# Patient Record
Sex: Female | Born: 1955 | Race: White | Hispanic: No | Smoking: Current every day smoker
Health system: Southern US, Community
[De-identification: ages and names within clinical notes are randomized; demographics above are authoritative.]

## PROBLEM LIST (undated history)

## (undated) DIAGNOSIS — F419 Anxiety disorder, unspecified: Secondary | ICD-10-CM

## (undated) DIAGNOSIS — R011 Cardiac murmur, unspecified: Secondary | ICD-10-CM

## (undated) DIAGNOSIS — I10 Essential (primary) hypertension: Secondary | ICD-10-CM

## (undated) DIAGNOSIS — J449 Chronic obstructive pulmonary disease, unspecified: Secondary | ICD-10-CM

## (undated) DIAGNOSIS — E119 Type 2 diabetes mellitus without complications: Secondary | ICD-10-CM

## (undated) DIAGNOSIS — I2510 Atherosclerotic heart disease of native coronary artery without angina pectoris: Secondary | ICD-10-CM

## (undated) HISTORY — DX: Type 2 diabetes mellitus without complications: E11.9

## (undated) HISTORY — PX: NECK SURGERY: SHX720

## (undated) HISTORY — DX: Atherosclerotic heart disease of native coronary artery without angina pectoris: I25.10

---

## 1999-12-29 ENCOUNTER — Emergency Department (HOSPITAL_COMMUNITY): Admission: EM | Admit: 1999-12-29 | Discharge: 1999-12-29 | Payer: Self-pay | Admitting: Emergency Medicine

## 2002-12-27 ENCOUNTER — Ambulatory Visit (HOSPITAL_BASED_OUTPATIENT_CLINIC_OR_DEPARTMENT_OTHER): Admission: RE | Admit: 2002-12-27 | Discharge: 2002-12-27 | Payer: Self-pay | Admitting: General Surgery

## 2002-12-27 ENCOUNTER — Encounter (INDEPENDENT_AMBULATORY_CARE_PROVIDER_SITE_OTHER): Payer: Self-pay | Admitting: *Deleted

## 2004-05-25 ENCOUNTER — Encounter: Admission: RE | Admit: 2004-05-25 | Discharge: 2004-05-25 | Payer: Self-pay | Admitting: Orthopedic Surgery

## 2009-04-30 ENCOUNTER — Encounter: Admission: RE | Admit: 2009-04-30 | Discharge: 2009-04-30 | Payer: Self-pay | Admitting: Internal Medicine

## 2009-05-27 ENCOUNTER — Emergency Department (HOSPITAL_COMMUNITY): Admission: EM | Admit: 2009-05-27 | Discharge: 2009-05-27 | Payer: Self-pay | Admitting: Emergency Medicine

## 2010-06-26 ENCOUNTER — Ambulatory Visit (HOSPITAL_COMMUNITY)
Admission: RE | Admit: 2010-06-26 | Discharge: 2010-06-27 | Payer: Self-pay | Source: Home / Self Care | Attending: Orthopedic Surgery | Admitting: Orthopedic Surgery

## 2010-09-01 LAB — BASIC METABOLIC PANEL
BUN: 10 mg/dL (ref 6–23)
CO2: 29 mEq/L (ref 19–32)
Calcium: 9.7 mg/dL (ref 8.4–10.5)
Chloride: 103 mEq/L (ref 96–112)
GFR calc Af Amer: >60 mL/min (ref >60)
GFR calc non Af Amer: >60 mL/min (ref >60)
Glucose, Bld: 105 mg/dL — ABNORMAL HIGH (ref 70–99)
Sodium: 140 mEq/L (ref 135–145)

## 2010-09-01 LAB — CBC
Hemoglobin: 16.2 g/dL — ABNORMAL HIGH (ref 12.0–15.0)
MCHC: 33.1 g/dL (ref 30.0–36.0)
Platelets: 201 10*3/uL (ref 150–400)

## 2010-09-01 LAB — SURGICAL PCR SCREEN
MRSA, PCR: NEGATIVE
Staphylococcus aureus: NEGATIVE

## 2010-09-23 LAB — POCT I-STAT, CHEM 8
BUN: 9 mg/dL (ref 6–23)
Calcium, Ion: 1.15 mmol/L (ref 1.12–1.32)
Chloride: 105 mEq/L (ref 96–112)
Creatinine, Ser: 0.7 mg/dL (ref 0.4–1.2)
Glucose, Bld: 90 mg/dL (ref 70–99)
HCT: 47 % — ABNORMAL HIGH (ref 36.0–46.0)
Hemoglobin: 16 g/dL — ABNORMAL HIGH (ref 12.0–15.0)
Potassium: 3.9 mEq/L (ref 3.5–5.1)
Sodium: 140 mEq/L (ref 135–145)
TCO2: 26 mmol/L (ref 0–100)

## 2010-09-23 LAB — POCT CARDIAC MARKERS
CKMB, poc: <1 ng/mL — ABNORMAL LOW (ref 1.0–8.0)
Myoglobin, poc: 52.7 ng/mL (ref 12–200)
Troponin i, poc: <0.05 ng/mL (ref 0.00–0.09)

## 2010-11-07 NOTE — Op Note (Signed)
   NAMEKARALYNE, Molly [name]                          ACCOUNT NO.:  [id number]   MEDICAL RECORD NO.:  [id number]                   PATIENT TYPE:  AMB   LOCATION:  DSC                                  FACILITY:  [facility]   PHYSICIAN:  Ollen Gross. Vernell Morgans, M.D.              DATE OF BIRTH:  December 21, 1955   DATE OF PROCEDURE:  12/27/2002  DATE OF DISCHARGE:                                 OPERATIVE REPORT   PREOPERATIVE DIAGNOSIS:  Mass of the right neck.   POSTOPERATIVE DIAGNOSIS:  Mass of the right neck.   PROCEDURE:  Excision of 1 cm mass in the right neck.   SURGEON:  Ollen Gross. Carolynne Edouard, M.D.   ANESTHESIA:  Local.   DESCRIPTION OF PROCEDURE:  After informed consent was obtained, the patient  was brought to the operating room and placed in the left side down lateral  position on the operating room table.  The area in question was prepped with  Betadine and draped in the usual sterile manner.  The area was then  infiltrated with 1% lidocaine with epinephrine after an adequate field block  was obtained.  An elliptical incision was made overlying the mass.  This  incision was carried down through the skin and subcutaneous tissue sharply  with the 15 blade knife until the mass had been completely excised.  The  mass was sent to pathology for further evaluation.  The incision was then  closed with two interrupted 5-0 Monocryl subcuticular stitches.  A Dermabond  dressing was then applied.  The patient tolerated the procedure well.  At  the end of the case, all needle, sponge and instrument counts were correct.  The patient was awakened and taken to the recovery room in stable condition.                                               Ollen Gross. Vernell Morgans, M.D.    PST/MEDQ  D:  12/27/2002  T:  12/28/2002  Job:  [id number]

## 2011-02-18 ENCOUNTER — Other Ambulatory Visit: Payer: Self-pay | Admitting: Orthopedic Surgery

## 2011-02-18 DIAGNOSIS — R52 Pain, unspecified: Secondary | ICD-10-CM

## 2011-02-20 ENCOUNTER — Ambulatory Visit
Admission: RE | Admit: 2011-02-20 | Discharge: 2011-02-20 | Disposition: A | Discharge status: Home / Self Care | Payer: BC Managed Care – PPO | Source: Ambulatory Visit | Attending: Orthopedic Surgery | Admitting: Orthopedic Surgery

## 2011-02-20 DIAGNOSIS — R52 Pain, unspecified: Secondary | ICD-10-CM

## 2012-09-17 ENCOUNTER — Emergency Department (HOSPITAL_COMMUNITY): Payer: BC Managed Care – PPO

## 2012-09-17 ENCOUNTER — Emergency Department (HOSPITAL_COMMUNITY)
Admission: EM | Admit: 2012-09-17 | Discharge: 2012-09-17 | Disposition: A | Discharge status: Home / Self Care | Payer: BC Managed Care – PPO | Attending: Emergency Medicine | Admitting: Emergency Medicine

## 2012-09-17 ENCOUNTER — Encounter (HOSPITAL_COMMUNITY): Payer: Self-pay | Admitting: Cardiology

## 2012-09-17 DIAGNOSIS — W2203XA Walked into furniture, initial encounter: Secondary | ICD-10-CM | POA: Insufficient documentation

## 2012-09-17 DIAGNOSIS — Y9389 Activity, other specified: Secondary | ICD-10-CM | POA: Insufficient documentation

## 2012-09-17 DIAGNOSIS — R011 Cardiac murmur, unspecified: Secondary | ICD-10-CM | POA: Insufficient documentation

## 2012-09-17 DIAGNOSIS — J4489 Other specified chronic obstructive pulmonary disease: Secondary | ICD-10-CM | POA: Insufficient documentation

## 2012-09-17 DIAGNOSIS — I10 Essential (primary) hypertension: Secondary | ICD-10-CM | POA: Insufficient documentation

## 2012-09-17 DIAGNOSIS — S298XXA Other specified injuries of thorax, initial encounter: Secondary | ICD-10-CM | POA: Insufficient documentation

## 2012-09-17 DIAGNOSIS — F17200 Nicotine dependence, unspecified, uncomplicated: Secondary | ICD-10-CM | POA: Insufficient documentation

## 2012-09-17 DIAGNOSIS — Z7982 Long term (current) use of aspirin: Secondary | ICD-10-CM | POA: Insufficient documentation

## 2012-09-17 DIAGNOSIS — F411 Generalized anxiety disorder: Secondary | ICD-10-CM | POA: Insufficient documentation

## 2012-09-17 DIAGNOSIS — Y929 Unspecified place or not applicable: Secondary | ICD-10-CM | POA: Insufficient documentation

## 2012-09-17 DIAGNOSIS — Z79899 Other long term (current) drug therapy: Secondary | ICD-10-CM | POA: Insufficient documentation

## 2012-09-17 DIAGNOSIS — J449 Chronic obstructive pulmonary disease, unspecified: Secondary | ICD-10-CM | POA: Insufficient documentation

## 2012-09-17 DIAGNOSIS — R079 Chest pain, unspecified: Secondary | ICD-10-CM

## 2012-09-17 HISTORY — DX: Cardiac murmur, unspecified: R01.1

## 2012-09-17 HISTORY — DX: Anxiety disorder, unspecified: F41.9

## 2012-09-17 HISTORY — DX: Essential (primary) hypertension: I10

## 2012-09-17 HISTORY — DX: Chronic obstructive pulmonary disease, unspecified: J44.9

## 2012-09-17 MED ORDER — IBUPROFEN 600 MG PO TABS: 600.0000 mg | ORAL_TABLET | Freq: Three times a day (TID) | ORAL | Status: DC | PRN

## 2012-09-17 MED ORDER — OXYCODONE-ACETAMINOPHEN 5-325 MG PO TABS
1.0000 | ORAL_TABLET | Freq: Once | ORAL | Status: AC
  Administered 2012-09-17: 1 via ORAL
  Filled 2012-09-17: qty 1

## 2012-09-17 MED ORDER — OXYCODONE-ACETAMINOPHEN 5-325 MG PO TABS: 1.0000 | ORAL_TABLET | ORAL | Status: DC | PRN

## 2012-09-17 NOTE — ED Provider Notes (Signed)
History     CSN: [id number]  Arrival date & time 09/17/12  1159   First MD Initiated Contact with Patient 09/17/12 1346      Chief Complaint  Patient presents with  . Pain    The history is provided by the patient.   patient reports injury to her left chest wall after leaning over a couch resting the left side of her chest on the couch and picking something up at the right hand.  The pain in her left anterior chest was acute.  Been constant since then.  Is worse with movement and palpation.  Nothing improves her pain.  She denies any shortness of breath her fevers or chills.  No recent cough or congestion.  No weakness of her upper extremities.  She's not tried any home for the pain.  Her pain is moderate to severe at this time.  Past Medical History  Diagnosis Date  . Heart murmur   . Hypertension   . COPD (chronic obstructive pulmonary disease)   . Anxiety     Past Surgical History  Procedure Laterality Date  . Neck surgery      History reviewed. No pertinent family history.  History  Substance Use Topics  . Smoking status: Current Every Day Smoker  . Smokeless tobacco: Not on file  . Alcohol Use: Yes    OB History   Grav Para Term Preterm Abortions TAB SAB Ect Mult Living                  Review of Systems  All other systems reviewed and are negative.    Allergies  Penicillins  Home Medications   Current Outpatient Rx  Name  Route  Sig  Dispense  Refill  . aspirin EC 81 MG tablet   Oral   Take 81 mg by mouth daily.         . clonazePAM (KLONOPIN) 0.5 MG tablet   Oral   Take 0.5 mg by mouth 2 (two) times daily as needed for anxiety.         Marland Kitchen FLUoxetine (PROZAC) 20 MG capsule   Oral   Take 60 mg by mouth daily.         . Ibuprofen-Diphenhydramine Cit (IBUPROFEN PM) 200-38 MG TABS   Oral   Take 2 tablets by mouth as needed (for pain/sleep).         Marland Kitchen LISINOPRIL PO   Oral   Take by mouth.         Marland Kitchen ibuprofen (ADVIL,MOTRIN) 600 MG  tablet   Oral   Take 1 tablet (600 mg total) by mouth every 8 (eight) hours as needed for pain.   15 tablet   0   . oxyCODONE-acetaminophen (PERCOCET/ROXICET) 5-325 MG per tablet   Oral   Take 1 tablet by mouth every 4 (four) hours as needed for pain.   20 tablet   0     BP 133/82  Pulse 91  Temp(Src) 98 F (36.7 C) (Oral)  Resp 18  SpO2 97%  Physical Exam  Nursing note and vitals reviewed. Constitutional: She is oriented to person, place, and time. She appears well-developed and well-nourished. No distress.  HENT:  Head: Normocephalic and atraumatic.  Eyes: EOM are normal.  Neck: Normal range of motion.  Cardiovascular: Normal rate, regular rhythm and normal heart sounds.   Pulmonary/Chest: Effort normal and breath sounds normal.  Tenderness at left anterolateral chest wall without ecchymosis or crepitus.  No rash or  erythema.  Abdominal: Soft. She exhibits no distension. There is no tenderness.  Musculoskeletal: Normal range of motion.  Neurological: She is alert and oriented to person, place, and time.  Skin: Skin is warm and dry.  Psychiatric: She has a normal mood and affect. Judgment normal.    ED Course  Procedures (including critical care time)  Labs Reviewed - No data to display Dg Chest 2 View  09/17/2012  *RADIOLOGY REPORT*  Clinical Data: Left flank pain, shortness of breath  CHEST - 2 VIEW  Comparison: 02/24/2012  Findings: Normal heart size and vascularity.  Chronic interstitial changes and peripheral right midlung scarring as before.  No superimposed pneumonia or edema.  No effusion or pneumothorax. Trachea is midline.  Lower cervical fusion hardware evident. Overall stable exam.  IMPRESSION: Stable chronic changes.  No superimposed acute process   Original Report Authenticated By: Judie Petit. Miles Costain, M.D.    I personally reviewed the imaging tests through PACS system I reviewed available ER/hospitalization records through the EMR   1. Chest pain       MDM   The patient's pain is reproducible in her left chest after injury.  No noted fractures.  Discharge home with pain medicine.  This is not ACS.        Lyanne Co, MD 09/17/12 1425

## 2012-09-17 NOTE — ED Notes (Signed)
Pt states that her pain is only present when she is moving or touching her ribs. Pt states "I was just leaning over to get something and then my side started hurting." Pt states it started yesterday, did not take any pain medication.

## 2012-09-17 NOTE — ED Notes (Signed)
Pt reports she was leaning over the couch this afternoon and had a sharp pain in her left rib cage area. States it increases with movement and palpation. Denies any chest pain. Denies any injury to the area.

## 2012-12-02 ENCOUNTER — Encounter (HOSPITAL_COMMUNITY): Payer: Self-pay | Admitting: Emergency Medicine

## 2012-12-02 ENCOUNTER — Emergency Department (HOSPITAL_COMMUNITY): Payer: BC Managed Care – PPO

## 2012-12-02 ENCOUNTER — Emergency Department (HOSPITAL_COMMUNITY)
Admission: EM | Admit: 2012-12-02 | Discharge: 2012-12-02 | Disposition: A | Discharge status: Home / Self Care | Payer: BC Managed Care – PPO | Attending: Emergency Medicine | Admitting: Emergency Medicine

## 2012-12-02 DIAGNOSIS — Y929 Unspecified place or not applicable: Secondary | ICD-10-CM | POA: Insufficient documentation

## 2012-12-02 DIAGNOSIS — F17200 Nicotine dependence, unspecified, uncomplicated: Secondary | ICD-10-CM | POA: Insufficient documentation

## 2012-12-02 DIAGNOSIS — W1789XA Other fall from one level to another, initial encounter: Secondary | ICD-10-CM | POA: Insufficient documentation

## 2012-12-02 DIAGNOSIS — J4489 Other specified chronic obstructive pulmonary disease: Secondary | ICD-10-CM | POA: Insufficient documentation

## 2012-12-02 DIAGNOSIS — J449 Chronic obstructive pulmonary disease, unspecified: Secondary | ICD-10-CM | POA: Insufficient documentation

## 2012-12-02 DIAGNOSIS — F411 Generalized anxiety disorder: Secondary | ICD-10-CM | POA: Insufficient documentation

## 2012-12-02 DIAGNOSIS — Y939 Activity, unspecified: Secondary | ICD-10-CM | POA: Insufficient documentation

## 2012-12-02 DIAGNOSIS — Z79899 Other long term (current) drug therapy: Secondary | ICD-10-CM | POA: Insufficient documentation

## 2012-12-02 DIAGNOSIS — W19XXXA Unspecified fall, initial encounter: Secondary | ICD-10-CM

## 2012-12-02 DIAGNOSIS — T148XXA Other injury of unspecified body region, initial encounter: Secondary | ICD-10-CM | POA: Insufficient documentation

## 2012-12-02 DIAGNOSIS — I10 Essential (primary) hypertension: Secondary | ICD-10-CM | POA: Insufficient documentation

## 2012-12-02 DIAGNOSIS — Z880 Allergy status to penicillin: Secondary | ICD-10-CM | POA: Insufficient documentation

## 2012-12-02 DIAGNOSIS — Z7982 Long term (current) use of aspirin: Secondary | ICD-10-CM | POA: Insufficient documentation

## 2012-12-02 DIAGNOSIS — R011 Cardiac murmur, unspecified: Secondary | ICD-10-CM | POA: Insufficient documentation

## 2012-12-02 LAB — CBC WITH DIFFERENTIAL/PLATELET
Basophils Absolute: 0 10*3/uL (ref 0.0–0.1)
Basophils Relative: 0 % (ref 0–1)
Eosinophils Absolute: 0.2 10*3/uL (ref 0.0–0.7)
Eosinophils Relative: 2 % (ref 0–5)
HCT: 38 % (ref 36.0–46.0)
Hemoglobin: 12.7 g/dL (ref 12.0–15.0)
Lymphocytes Relative: 27 % (ref 12–46)
MCH: 31.8 pg (ref 26.0–34.0)
MCHC: 33.4 g/dL (ref 30.0–36.0)
MCV: 95.2 fL (ref 78.0–100.0)
Monocytes Absolute: 0.6 10*3/uL (ref 0.1–1.0)
Neutrophils Relative %: 67 % (ref 43–77)
RDW: 14.2 % (ref 11.5–15.5)
WBC: 14.6 10*3/uL — ABNORMAL HIGH (ref 4.0–10.5)

## 2012-12-02 LAB — APTT: aPTT: 27 seconds (ref 24–37)

## 2012-12-02 LAB — PROTIME-INR
INR: 0.98 (ref 0.00–1.49)
Prothrombin Time: 12.9 seconds (ref 11.6–15.2)

## 2012-12-02 MED ORDER — HYDROCODONE-ACETAMINOPHEN 5-325 MG PO TABS: 1.0000 | ORAL_TABLET | Freq: Four times a day (QID) | ORAL | Status: DC | PRN

## 2012-12-02 NOTE — ED Notes (Signed)
Pt fell backwards down wooden porch steps Wednesday evening. Has bruising to left buttock mostly with some bruising to the right buttock. Pt reports sacral pain and back pain from the fall.

## 2012-12-02 NOTE — ED Notes (Signed)
Pt presents to ED with c/o bruise and hematoma on her back after falling down some steps the other night.

## 2012-12-02 NOTE — ED Provider Notes (Signed)
History    This chart was scribed for non-physician practitioner Magnus Sinning, PA-C working with Doug Sou, MD by Toya Smothers, ED Scribe. This patient was seen in room TR05C/TR05C and the patient's care was started at 7:08 PM.   CSN: [id number]  Arrival date & time 12/02/12  1626   First MD Initiated Contact with Patient 12/02/12 1648      Chief Complaint  Patient presents with  . Fall    Patient is a 57 y.o. female presenting with fall. The history is provided by the patient. No language interpreter was used.  Fall    HPI Comments:   Molly [name] is a 57 y.o. female with h/o HTN, COPD, and heart murmur, who presents to the Emergency Department c/o fall 2 days ago. Pt now complains of moderate lower lumbar and mild neck pain.  Patient also complaining of pain of the left buttocks.    Buttocks pain is described as severe soreness, and is aggravated when sitting, turning, lifting, and ambulating. Pt reports falling approximately 3 feet from the top of a friends concrete porch onto her lower lumbar and coccyx region.  Pain has worsened acutely in the past 24 hours. She denies cephalic injury, LOC, or dizziness. Despite taking Ibuprofen 800 mg, there has been no improvement to pain. Current medications include Lisinopril, 81 mg Aspirin, Clonzepam, and Fluoxetine. Pt now endorses a large bruise to her coccyx area. She denies headache, diaphoresis, nausea, vomiting,  weakness, chest pain, SOB or any other pain.  She has been ambulatory since the fall.  Pt is a current everyday smoker, denying alcohol and illicit drug use.    Past Medical History  Diagnosis Date  . Heart murmur   . Hypertension   . COPD (chronic obstructive pulmonary disease)   . Anxiety     Past Surgical History  Procedure Laterality Date  . Neck surgery      History reviewed. No pertinent family history.  History  Substance Use Topics  . Smoking status: Current Every Day Smoker  . Smokeless  tobacco: Not on file  . Alcohol Use: Yes    Review of Systems  HENT: Positive for neck pain.   Musculoskeletal: Positive for back pain.  All other systems reviewed and are negative.    Allergies  Penicillins  Home Medications   Current Outpatient Rx  Name  Route  Sig  Dispense  Refill  . aspirin EC 81 MG tablet   Oral   Take 81 mg by mouth daily.         . clonazePAM (KLONOPIN) 0.5 MG tablet   Oral   Take 0.5 mg by mouth 2 (two) times daily as needed for anxiety.         Marland Kitchen FLUoxetine (PROZAC) 20 MG capsule   Oral   Take 60 mg by mouth daily.         . Ibuprofen-Diphenhydramine Cit (IBUPROFEN PM) 200-38 MG TABS   Oral   Take 2 tablets by mouth as needed (for pain/sleep).         Marland Kitchen lisinopril (PRINIVIL,ZESTRIL) 40 MG tablet   Oral   Take 40 mg by mouth daily.         Marland Kitchen HYDROcodone-acetaminophen (NORCO/VICODIN) 5-325 MG per tablet   Oral   Take 1-2 tablets by mouth every 6 (six) hours as needed for pain.   20 tablet   0     BP 123/70  Pulse 103  Temp(Src) 97.4 F (36.3 C) (  Oral)  Resp 18  Ht 5\' 3"  (1.6 m)  Wt 150 lb (68.04 kg)  BMI 26.58 kg/m2  SpO2 97%  Physical Exam  Nursing note and vitals reviewed. Constitutional: She is oriented to person, place, and time. She appears well-developed and well-nourished. No distress.  HENT:  Head: Normocephalic and atraumatic.  Eyes: EOM are normal. Pupils are equal, round, and reactive to light.  Neck: Neck supple. No tracheal deviation present.  No tenderness of the C-spine  Cardiovascular: Normal rate, regular rhythm and normal heart sounds.   No murmur heard. 2+ dorsal pedis pulses bilaterally  Pulmonary/Chest: Effort normal and breath sounds normal. No respiratory distress.  Abdominal: Soft. There is no tenderness.  Musculoskeletal: Normal range of motion.       Cervical back: She exhibits normal range of motion, no tenderness, no bony tenderness, no swelling, no edema and no deformity.        Thoracic back: She exhibits normal range of motion, no tenderness, no bony tenderness, no swelling, no edema and no deformity.       Lumbar back: She exhibits normal range of motion, no tenderness, no bony tenderness, no swelling, no edema and no deformity.  Good ROM of both hips without pain Tenderness to palpation of L sacrum and coccyx  Full ROM of upper and lower extremities without pain  Neurological: She is alert and oriented to person, place, and time.  Sensation is intact  Skin: Skin is warm and dry.  Large hematoma covering the entire left upper buttucks  Psychiatric: She has a normal mood and affect. Her behavior is normal.    ED Course  Procedures DIAGNOSTIC STUDIES: Oxygen Saturation is 97% on room air, normal by my interpretation.    COORDINATION OF CARE: 17:12- Evaluated Pt. Pt is awake, alert, and without distress. 17:19- Patient understands and agrees with initial ED impression and plan with expectations set for ED visit. 17:22- Discussed Pt with Dr. Ethelda Chick, MD     Labs Reviewed  CBC WITH DIFFERENTIAL - Abnormal; Notable for the following:    WBC 14.6 (*)    Neutro Abs 9.8 (*)    All other components within normal limits  PROTIME-INR  APTT   Ct Pelvis Wo Contrast  12/02/2012   *RADIOLOGY REPORT*  Clinical Data: Large left hip hematoma following a fall.  CT PELVIS WITHOUT CONTRAST  Technique:  Multidetector CT imaging of the pelvis was performed following the standard protocol without intravenous contrast.  Comparison: None.  Findings: Left lateral and posterior soft tissue swelling in the subcutaneous fat as well as confluent density in the left posterior subcutaneous fat compatible with hematoma formation.  This measures 10.2 x 1.8 cm on image number 29.  No fracture or dislocation. Surgically absent uterus.  No adnexal masses or enlarged lymph nodes.  No bladder or bowel abnormalities.  Lumbosacral spine degenerative changes.  IMPRESSION:  1.  Bruising and  hematoma in the posterior subcutaneous fat on the left. 2.  No fracture or dislocation.   Original Report Authenticated By: Beckie Salts, M.D.     1. Fall, initial encounter   2. Hematoma       MDM  Patient presents with pain and bruising of her sacrum after a fall 2 days ago.  She has been ambulatory since the fall.  At that time she has a very large hematoma to the left buttocks.  She is on Aspirin 81 mg daily, but no other blood thinning medications.  CT pelvis shows hematoma,  but not other acute abnormalities.  Patient stable for discharge.  Patient discharged home with pain medication.  I personally performed the services described in this documentation, which was scribed in my presence. The recorded information has been reviewed and is accurate.    Pascal [place], PA-C 12/03/12 [phone]

## 2012-12-03 NOTE — ED Provider Notes (Signed)
Medical screening examination/treatment/procedure(s) were performed by non-physician practitioner and as supervising physician I was immediately available for consultation/collaboration.  Doug Sou, MD 12/03/12 1453

## 2015-10-14 DIAGNOSIS — I10 Essential (primary) hypertension: Secondary | ICD-10-CM | POA: Diagnosis not present

## 2015-10-14 DIAGNOSIS — E7800 Pure hypercholesterolemia, unspecified: Secondary | ICD-10-CM | POA: Diagnosis not present

## 2015-10-14 DIAGNOSIS — G4700 Insomnia, unspecified: Secondary | ICD-10-CM | POA: Diagnosis not present

## 2015-10-14 DIAGNOSIS — F419 Anxiety disorder, unspecified: Secondary | ICD-10-CM | POA: Diagnosis not present

## 2015-10-14 DIAGNOSIS — E119 Type 2 diabetes mellitus without complications: Secondary | ICD-10-CM | POA: Diagnosis not present

## 2015-11-13 DIAGNOSIS — G4700 Insomnia, unspecified: Secondary | ICD-10-CM | POA: Diagnosis not present

## 2015-11-13 DIAGNOSIS — F4321 Adjustment disorder with depressed mood: Secondary | ICD-10-CM | POA: Diagnosis not present

## 2015-11-13 DIAGNOSIS — F419 Anxiety disorder, unspecified: Secondary | ICD-10-CM | POA: Diagnosis not present

## 2016-02-18 DIAGNOSIS — L409 Psoriasis, unspecified: Secondary | ICD-10-CM | POA: Diagnosis not present

## 2016-02-18 DIAGNOSIS — F419 Anxiety disorder, unspecified: Secondary | ICD-10-CM | POA: Diagnosis not present

## 2016-02-18 DIAGNOSIS — G4700 Insomnia, unspecified: Secondary | ICD-10-CM | POA: Diagnosis not present

## 2016-02-18 DIAGNOSIS — F4321 Adjustment disorder with depressed mood: Secondary | ICD-10-CM | POA: Diagnosis not present

## 2016-03-09 DIAGNOSIS — F419 Anxiety disorder, unspecified: Secondary | ICD-10-CM | POA: Diagnosis not present

## 2016-03-09 DIAGNOSIS — F331 Major depressive disorder, recurrent, moderate: Secondary | ICD-10-CM | POA: Diagnosis not present

## 2016-03-13 DIAGNOSIS — H2513 Age-related nuclear cataract, bilateral: Secondary | ICD-10-CM | POA: Diagnosis not present

## 2016-03-16 DIAGNOSIS — M545 Low back pain: Secondary | ICD-10-CM | POA: Diagnosis not present

## 2016-05-26 DIAGNOSIS — F4321 Adjustment disorder with depressed mood: Secondary | ICD-10-CM | POA: Diagnosis not present

## 2016-05-26 DIAGNOSIS — E119 Type 2 diabetes mellitus without complications: Secondary | ICD-10-CM | POA: Diagnosis not present

## 2016-05-26 DIAGNOSIS — G4700 Insomnia, unspecified: Secondary | ICD-10-CM | POA: Diagnosis not present

## 2016-05-26 DIAGNOSIS — F419 Anxiety disorder, unspecified: Secondary | ICD-10-CM | POA: Diagnosis not present

## 2016-05-26 DIAGNOSIS — Z23 Encounter for immunization: Secondary | ICD-10-CM | POA: Diagnosis not present

## 2016-05-26 DIAGNOSIS — M545 Low back pain: Secondary | ICD-10-CM | POA: Diagnosis not present

## 2016-05-26 DIAGNOSIS — I10 Essential (primary) hypertension: Secondary | ICD-10-CM | POA: Diagnosis not present

## 2016-11-26 DIAGNOSIS — I10 Essential (primary) hypertension: Secondary | ICD-10-CM | POA: Diagnosis not present

## 2016-11-26 DIAGNOSIS — M25569 Pain in unspecified knee: Secondary | ICD-10-CM | POA: Diagnosis not present

## 2016-11-26 DIAGNOSIS — F419 Anxiety disorder, unspecified: Secondary | ICD-10-CM | POA: Diagnosis not present

## 2016-11-26 DIAGNOSIS — E7800 Pure hypercholesterolemia, unspecified: Secondary | ICD-10-CM | POA: Diagnosis not present

## 2016-11-26 DIAGNOSIS — G4700 Insomnia, unspecified: Secondary | ICD-10-CM | POA: Diagnosis not present

## 2016-11-26 DIAGNOSIS — E119 Type 2 diabetes mellitus without complications: Secondary | ICD-10-CM | POA: Diagnosis not present

## 2017-05-14 ENCOUNTER — Emergency Department (HOSPITAL_COMMUNITY): Payer: Medicare Other

## 2017-05-14 ENCOUNTER — Other Ambulatory Visit: Payer: Self-pay

## 2017-05-14 ENCOUNTER — Encounter (HOSPITAL_COMMUNITY): Payer: Self-pay | Admitting: *Deleted

## 2017-05-14 ENCOUNTER — Observation Stay (HOSPITAL_COMMUNITY)
Admission: EM | Admit: 2017-05-14 | Discharge: 2017-05-15 | Disposition: A | Discharge status: Home / Self Care | Payer: Medicare Other | Attending: Internal Medicine | Admitting: Internal Medicine

## 2017-05-14 DIAGNOSIS — J440 Chronic obstructive pulmonary disease with acute lower respiratory infection: Principal | ICD-10-CM | POA: Insufficient documentation

## 2017-05-14 DIAGNOSIS — Z79899 Other long term (current) drug therapy: Secondary | ICD-10-CM | POA: Insufficient documentation

## 2017-05-14 DIAGNOSIS — F418 Other specified anxiety disorders: Secondary | ICD-10-CM

## 2017-05-14 DIAGNOSIS — F17210 Nicotine dependence, cigarettes, uncomplicated: Secondary | ICD-10-CM | POA: Insufficient documentation

## 2017-05-14 DIAGNOSIS — I10 Essential (primary) hypertension: Secondary | ICD-10-CM | POA: Diagnosis not present

## 2017-05-14 DIAGNOSIS — F329 Major depressive disorder, single episode, unspecified: Secondary | ICD-10-CM | POA: Insufficient documentation

## 2017-05-14 DIAGNOSIS — J189 Pneumonia, unspecified organism: Secondary | ICD-10-CM

## 2017-05-14 DIAGNOSIS — J449 Chronic obstructive pulmonary disease, unspecified: Secondary | ICD-10-CM

## 2017-05-14 DIAGNOSIS — R05 Cough: Secondary | ICD-10-CM | POA: Diagnosis present

## 2017-05-14 DIAGNOSIS — K219 Gastro-esophageal reflux disease without esophagitis: Secondary | ICD-10-CM | POA: Diagnosis not present

## 2017-05-14 DIAGNOSIS — F419 Anxiety disorder, unspecified: Secondary | ICD-10-CM | POA: Insufficient documentation

## 2017-05-14 DIAGNOSIS — Z7982 Long term (current) use of aspirin: Secondary | ICD-10-CM | POA: Diagnosis not present

## 2017-05-14 DIAGNOSIS — F17200 Nicotine dependence, unspecified, uncomplicated: Secondary | ICD-10-CM | POA: Diagnosis present

## 2017-05-14 LAB — CBC WITH DIFFERENTIAL/PLATELET
Basophils Absolute: 0 10*3/uL (ref 0.0–0.1)
Basophils Relative: 0 %
Eosinophils Absolute: 0.2 10*3/uL (ref 0.0–0.7)
Eosinophils Relative: 2 %
HCT: 44.9 % (ref 36.0–46.0)
Hemoglobin: 14.9 g/dL (ref 12.0–15.0)
Lymphocytes Relative: 20 %
Lymphs Abs: 2.4 10*3/uL (ref 0.7–4.0)
MCH: 32.2 pg (ref 26.0–34.0)
MCHC: 33.2 g/dL (ref 30.0–36.0)
MCV: 97 fL (ref 78.0–100.0)
Monocytes Absolute: 0.5 10*3/uL (ref 0.1–1.0)
Monocytes Relative: 4 %
Neutro Abs: 9.1 10*3/uL — ABNORMAL HIGH (ref 1.7–7.7)
Neutrophils Relative %: 74 %
Platelets: 243 10*3/uL (ref 150–400)
RBC: 4.63 MIL/uL (ref 3.87–5.11)
RDW: 13.4 % (ref 11.5–15.5)
WBC: 12.1 10*3/uL — ABNORMAL HIGH (ref 4.0–10.5)

## 2017-05-14 LAB — COMPREHENSIVE METABOLIC PANEL
ALT: 23 U/L (ref 14–54)
AST: 23 U/L (ref 15–41)
Albumin: 3.6 g/dL (ref 3.5–5.0)
Alkaline Phosphatase: 132 U/L — ABNORMAL HIGH (ref 38–126)
Anion gap: 8 (ref 5–15)
BUN: 11 mg/dL (ref 6–20)
CO2: 27 mmol/L (ref 22–32)
Calcium: 8.8 mg/dL — ABNORMAL LOW (ref 8.9–10.3)
Chloride: 101 mmol/L (ref 101–111)
Creatinine, Ser: 0.63 mg/dL (ref 0.44–1.00)
GFR calc Af Amer: >60 mL/min (ref >60)
GFR calc non Af Amer: >60 mL/min (ref >60)
Glucose, Bld: 168 mg/dL — ABNORMAL HIGH (ref 65–99)
Potassium: 3.9 mmol/L (ref 3.5–5.1)
Sodium: 136 mmol/L (ref 135–145)
Total Bilirubin: 0.4 mg/dL (ref 0.3–1.2)
Total Protein: 6.4 g/dL — ABNORMAL LOW (ref 6.5–8.1)

## 2017-05-14 LAB — PROCALCITONIN

## 2017-05-14 LAB — I-STAT CG4 LACTIC ACID, ED: Lactic Acid, Venous: 0.72 mmol/L (ref 0.5–1.9)

## 2017-05-14 MED ORDER — DEXTROSE 5 % IV SOLN: 1.0000 g | INTRAVENOUS | Status: DC

## 2017-05-14 MED ORDER — DEXTROSE 5 % IV SOLN: 500.0000 mg | INTRAVENOUS | Status: DC

## 2017-05-14 MED ORDER — ZOLPIDEM TARTRATE 5 MG PO TABS
5.0000 mg | ORAL_TABLET | Freq: Every evening | ORAL | Status: DC | PRN
  Administered 2017-05-15: 5 mg via ORAL
  Filled 2017-05-14: qty 1

## 2017-05-14 MED ORDER — LISINOPRIL 20 MG PO TABS
40.0000 mg | ORAL_TABLET | Freq: Every day | ORAL | Status: DC
  Administered 2017-05-15: 40 mg via ORAL
  Filled 2017-05-14: qty 2

## 2017-05-14 MED ORDER — IPRATROPIUM-ALBUTEROL 0.5-2.5 (3) MG/3ML IN SOLN: 3.0000 mL | RESPIRATORY_TRACT | Status: DC | PRN

## 2017-05-14 MED ORDER — ASPIRIN EC 81 MG PO TBEC
81.0000 mg | DELAYED_RELEASE_TABLET | Freq: Every day | ORAL | Status: DC
  Administered 2017-05-15: 81 mg via ORAL
  Filled 2017-05-14: qty 1

## 2017-05-14 MED ORDER — ENOXAPARIN SODIUM 40 MG/0.4ML ~~LOC~~ SOLN
40.0000 mg | Freq: Every day | SUBCUTANEOUS | Status: DC
  Administered 2017-05-15: 40 mg via SUBCUTANEOUS
  Filled 2017-05-14: qty 0.4

## 2017-05-14 MED ORDER — CEFTRIAXONE SODIUM 1 G IJ SOLR
1.0000 g | Freq: Once | INTRAMUSCULAR | Status: AC
  Administered 2017-05-14: 1 g via INTRAVENOUS
  Filled 2017-05-14: qty 10

## 2017-05-14 MED ORDER — HYDROCODONE-ACETAMINOPHEN 5-325 MG PO TABS: 1.0000 | ORAL_TABLET | Freq: Four times a day (QID) | ORAL | Status: DC | PRN

## 2017-05-14 MED ORDER — CLONAZEPAM 0.5 MG PO TABS: 0.5000 mg | ORAL_TABLET | Freq: Two times a day (BID) | ORAL | Status: DC | PRN

## 2017-05-14 MED ORDER — FLUOXETINE HCL 20 MG PO CAPS
60.0000 mg | ORAL_CAPSULE | Freq: Every day | ORAL | Status: DC
  Administered 2017-05-15: 60 mg via ORAL
  Filled 2017-05-14: qty 3

## 2017-05-14 MED ORDER — DEXTROSE 5 % IV SOLN
500.0000 mg | Freq: Once | INTRAVENOUS | Status: AC
  Administered 2017-05-14: 500 mg via INTRAVENOUS
  Filled 2017-05-14: qty 500

## 2017-05-14 MED ORDER — NICOTINE 21 MG/24HR TD PT24
21.0000 mg | MEDICATED_PATCH | Freq: Every day | TRANSDERMAL | Status: DC
  Administered 2017-05-14: 21 mg via TRANSDERMAL
  Filled 2017-05-14 (×2): qty 1

## 2017-05-14 NOTE — H&P (Signed)
History and Physical    Molly CollaDeborah A [name] ZOX:[id number] DOB: 1955-08-14 DOA: 05/14/2017  PCP: [name], L.[name], MD   Patient coming from: Home  Chief Complaint: SOB, productive cough  HPI: Molly [name] is a 61 y.o. female with medical history significant for anxiety, hypertension, and chronic pain, now presenting to the emergency department for evaluation of shortness of breath and productive cough.  Patient reports that she was in her usual state of health until approximately 1-2 weeks ago when she noted the insidious development of dyspnea and cough.  Over the ensuing days, her condition is progressively worsened, marked by increasing dyspnea, now with symptoms at rest, and increasingly frequent cough, now productive of thick white sputum.  She reports some subjective fevers at home but has not taken her temperature.  She denies any rhinorrhea, sick contacts, or recent long distance travel.  She reports that she was vaccinated against seasonal influenza this year.  No chest pain, and no lower extremity swelling or tenderness.  ED Course: Upon arrival to the ED, patient is found to be afebrile, saturating mid 90s on room air, slightly tachycardic, and with vitals otherwise stable.  Chemistry panel is unremarkable and CBC is notable for leukocytosis 12,100.  Lactic acid is reassuringly normal.  Chest x-ray features vague ill-defined opacities involving the right lung base, left mid lung, and upper lobes, concerning for multifocal pneumonia.  Blood cultures were collected and the patient was treated with Rocephin and azithromycin in the emergency department.  She remains hemodynamically stable, but continues to be dyspneic at rest and will be observed in medical-surgical unit for ongoing evaluation and management of community-acquired pneumonia.  Review of Systems:  All other systems reviewed and apart from HPI, are negative.  Past Medical History:  Diagnosis Date  . Anxiety   . COPD (chronic  obstructive pulmonary disease) (HCC)   . Heart murmur   . Hypertension     Past Surgical History:  Procedure Laterality Date  . NECK SURGERY       reports that she has been smoking.  she has never used smokeless tobacco. She reports that she drinks alcohol. She reports that she does not use drugs.  Allergies  Allergen Reactions  . Penicillins Hives    Family History  Problem Relation Age of Onset  . Sudden Cardiac Death Neg Hx      Prior to Admission medications   Medication Sig Start Date End Date Taking? Authorizing Provider  aspirin EC 81 MG tablet Take 81 mg by mouth daily.    [provider]  clonazePAM (KLONOPIN) 0.5 MG tablet Take 0.5 mg by mouth 2 (two) times daily as needed for anxiety.    [provider]  FLUoxetine (PROZAC) 20 MG capsule Take 60 mg by mouth daily.    [provider]  HYDROcodone-acetaminophen (NORCO/VICODIN) 5-325 MG per tablet Take 1-2 tablets by mouth every 6 (six) hours as needed for pain. 12/02/12   Molly [name], Heather, PA-C  Ibuprofen-Diphenhydramine Cit (IBUPROFEN PM) 200-38 MG TABS Take 2 tablets by mouth as needed (for pain/sleep).    [provider]  lisinopril (PRINIVIL,ZESTRIL) 40 MG tablet Take 40 mg by mouth daily.    [provider]    Physical Exam: Vitals:   05/14/17 1744 05/14/17 1750 05/14/17 2136  BP: 133/89  134/76  Pulse: (!) 118  (!) 101  Resp: 17  18  Temp: 98.2 F (36.8 C)  98.7 F (37.1 C)  TempSrc: Oral  Oral  SpO2: 95%  96%  Weight:  72.6 kg (160 lb)   Height:  5\' 4"  (1.626 m)       Constitutional: Dyspneic with speech, calm, no pallor  Eyes: PERTLA, lids and conjunctivae normal ENMT: Mucous membranes are moist. Posterior pharynx clear of any exudate or lesions.   Neck: normal, supple, no masses, no thyromegaly Respiratory: Scattered rhonchi. No wheezes. Mild tachypnea. No accessory muscle use.  Cardiovascular: S1 & S2 heard, regular rate and rhythm. No significant  JVD. Abdomen: No distension, no tenderness, no masses palpated. Bowel sounds normal.  Musculoskeletal: no clubbing / cyanosis. No joint deformity upper and lower extremities.    Skin: no significant rashes, lesions, ulcers. Warm, dry, well-perfused. Neurologic: CN 2-12 grossly intact. Sensation intact. Strength 5/5 in all 4 limbs.  Psychiatric: Alert and oriented x 3. Pleasant and cooperative.     Labs on Admission: I have personally reviewed following labs and imaging studies  CBC: Recent Labs  Lab 05/14/17 2033  WBC 12.1*  NEUTROABS 9.1*  HGB 14.9  HCT 44.9  MCV 97.0  PLT 243   Basic Metabolic Panel: Recent Labs  Lab 05/14/17 2033  NA 136  K 3.9  CL 101  CO2 27  GLUCOSE 168*  BUN 11  CREATININE 0.63  CALCIUM 8.8*   GFR: Estimated Creatinine Clearance: 70.3 mL/min (by C-G formula based on SCr of 0.63 mg/dL). Liver Function Tests: Recent Labs  Lab 05/14/17 2033  AST 23  ALT 23  ALKPHOS 132*  BILITOT 0.4  PROT 6.4*  ALBUMIN 3.6   No results for input(s): LIPASE, AMYLASE in the last 168 hours. No results for input(s): AMMONIA in the last 168 hours. Coagulation Profile: No results for input(s): INR, PROTIME in the last 168 hours. Cardiac Enzymes: No results for input(s): CKTOTAL, CKMB, CKMBINDEX, TROPONINI in the last 168 hours. BNP (last 3 results) No results for input(s): PROBNP in the last 8760 hours. HbA1C: No results for input(s): HGBA1C in the last 72 hours. CBG: No results for input(s): GLUCAP in the last 168 hours. Lipid Profile: No results for input(s): CHOL, HDL, LDLCALC, TRIG, CHOLHDL, LDLDIRECT in the last 72 hours. Thyroid Function Tests: No results for input(s): TSH, T4TOTAL, FREET4, T3FREE, THYROIDAB in the last 72 hours. Anemia Panel: No results for input(s): VITAMINB12, FOLATE, FERRITIN, TIBC, IRON, RETICCTPCT in the last 72 hours. Urine analysis: No results found for: COLORURINE, APPEARANCEUR, LABSPEC, PHURINE, GLUCOSEU, HGBUR,  BILIRUBINUR, KETONESUR, PROTEINUR, UROBILINOGEN, NITRITE, LEUKOCYTESUR Sepsis Labs: @LABRCNTIP (procalcitonin:4,lacticidven:4) )No results found for this or any previous visit (from the past 240 hour(s)).   Radiological Exams on Admission: Dg Chest 2 View  Result Date: 05/14/2017 CLINICAL DATA:  61 year old female with cough and shortness of breath for 2 weeks. EXAM: CHEST  2 VIEW COMPARISON:  09/17/2012. FINDINGS: The heart size and mediastinal contours are within normal limits. Slightly increased ill-defined opacities at the right lung base and left mid and upper lungs noted. No sizable pleural effusion or pneumothorax. Stable postoperative changes of the cervical spine. The visualized skeletal structures are otherwise unremarkable. IMPRESSION: Vague ill-defined opacity involving the right lung base and left mid and upper lungs concerning for multifocal pneumonia. Follow-up to resolution recommended. Electronically Signed   By: Sande Brothers M.D.   On: 05/14/2017 18:29    EKG: Not performed.   Assessment/Plan  1. CAP  - Pt presents with productive cough and dyspnea, is found to have leukocytosis and CXR findings concerning for multifocal PNA  - Blood cultures were collected in ED and she  was treated with empiric Rocephin and azithromycin  - She continues to be symptomatic with dyspnea at rest and will be observed in hospital  - Plan to add sputum culture, check strep pneumo antigen and respiratory virus panel, trend procaclcitonin, continue empiric abx, start nebs prn    2. Hypertension - BP at goal, continue lisinopril    3. Anxiety  - Stable  - Continue Prozac and Klonopin   4. Tobacco abuse  - Pt smokes ~1 ppd, is interested in quitting  - RN asked to provide smoking-cessation information prior to discharge  - Nicotine patch provided    DVT prophylaxis: Lovenox Code Status: Full  Family Communication: Discussed with patient Disposition Plan: Observe on med-surg Consults  called: None Admission status: Observation    Briscoe Deutscherimothy S Kriss Ishler, MD [facility] Pager [phone]  If 7PM-7AM, please contact night-coverage [email or web address] Password [email or web address]  05/14/2017, 10:30 PM

## 2017-05-14 NOTE — ED Notes (Addendum)
Report called and accepted by Vincenza HewsQuinn on 5W.

## 2017-05-14 NOTE — ED Provider Notes (Signed)
Coughing for the past 2 weeks associated symptoms include mild shortness of breath.  She continues to smoke. no fever no nausea or vomiting.  Treated with cough medicine without relief.  On exam she appears in no respiratory distress.  Nontoxic.  Lungs diffuse scant rhonchi.  Speaks in paragraphs. Chest x-ray reviewed by me.  I counseled patient for 5 minutes on smoking cessation   Doug SouJacubowitz, Pascal Stiggers, MD 05/14/17 2155

## 2017-05-14 NOTE — ED Triage Notes (Signed)
The pt is c/o a cough  For 2 weeks  Unable to sleep due to her coughing.  She also has abd soreness from coughing  Productive cough white mucous  She is a smoker

## 2017-05-15 DIAGNOSIS — I10 Essential (primary) hypertension: Secondary | ICD-10-CM | POA: Diagnosis not present

## 2017-05-15 DIAGNOSIS — F17200 Nicotine dependence, unspecified, uncomplicated: Secondary | ICD-10-CM | POA: Diagnosis not present

## 2017-05-15 DIAGNOSIS — J449 Chronic obstructive pulmonary disease, unspecified: Secondary | ICD-10-CM

## 2017-05-15 DIAGNOSIS — K219 Gastro-esophageal reflux disease without esophagitis: Secondary | ICD-10-CM | POA: Diagnosis not present

## 2017-05-15 DIAGNOSIS — J189 Pneumonia, unspecified organism: Secondary | ICD-10-CM | POA: Diagnosis not present

## 2017-05-15 DIAGNOSIS — F418 Other specified anxiety disorders: Secondary | ICD-10-CM

## 2017-05-15 LAB — BASIC METABOLIC PANEL
ANION GAP: 7 (ref 5–15)
BUN: 12 mg/dL (ref 6–20)
CHLORIDE: 104 mmol/L (ref 101–111)
CO2: 28 mmol/L (ref 22–32)
Calcium: 8.6 mg/dL — ABNORMAL LOW (ref 8.9–10.3)
Creatinine, Ser: 0.65 mg/dL (ref 0.44–1.00)
GFR calc Af Amer: >60 mL/min (ref >60)
Glucose, Bld: 114 mg/dL — ABNORMAL HIGH (ref 65–99)
POTASSIUM: 3.8 mmol/L (ref 3.5–5.1)
SODIUM: 139 mmol/L (ref 135–145)

## 2017-05-15 LAB — RESPIRATORY PANEL BY PCR
Adenovirus: NOT DETECTED
Bordetella pertussis: NOT DETECTED
CORONAVIRUS 229E-RVPPCR: NOT DETECTED
CORONAVIRUS OC43-RVPPCR: NOT DETECTED
Chlamydophila pneumoniae: NOT DETECTED
Coronavirus HKU1: NOT DETECTED
Coronavirus NL63: NOT DETECTED
INFLUENZA B-RVPPCR: NOT DETECTED
Influenza A: NOT DETECTED
METAPNEUMOVIRUS-RVPPCR: NOT DETECTED
Mycoplasma pneumoniae: NOT DETECTED
PARAINFLUENZA VIRUS 1-RVPPCR: NOT DETECTED
PARAINFLUENZA VIRUS 2-RVPPCR: NOT DETECTED
PARAINFLUENZA VIRUS 3-RVPPCR: NOT DETECTED
PARAINFLUENZA VIRUS 4-RVPPCR: NOT DETECTED
RESPIRATORY SYNCYTIAL VIRUS-RVPPCR: NOT DETECTED
RHINOVIRUS / ENTEROVIRUS - RVPPCR: NOT DETECTED

## 2017-05-15 LAB — CBC WITH DIFFERENTIAL/PLATELET
BASOS ABS: 0 10*3/uL (ref 0.0–0.1)
Basophils Relative: 0 %
Eosinophils Absolute: 0.2 10*3/uL (ref 0.0–0.7)
Eosinophils Relative: 2 %
HEMATOCRIT: 42.1 % (ref 36.0–46.0)
HEMOGLOBIN: 13.6 g/dL (ref 12.0–15.0)
LYMPHS ABS: 2.3 10*3/uL (ref 0.7–4.0)
LYMPHS PCT: 34 %
MCH: 31.5 pg (ref 26.0–34.0)
MCHC: 32.3 g/dL (ref 30.0–36.0)
MCV: 97.5 fL (ref 78.0–100.0)
Monocytes Absolute: 0.4 10*3/uL (ref 0.1–1.0)
Monocytes Relative: 5 %
NEUTROS ABS: 4 10*3/uL (ref 1.7–7.7)
NEUTROS PCT: 59 %
PLATELETS: 209 10*3/uL (ref 150–400)
RBC: 4.32 MIL/uL (ref 3.87–5.11)
RDW: 13.4 % (ref 11.5–15.5)
WBC: 6.8 10*3/uL (ref 4.0–10.5)

## 2017-05-15 LAB — PROCALCITONIN: Procalcitonin: <0.1 ng/mL

## 2017-05-15 LAB — HIV ANTIBODY (ROUTINE TESTING W REFLEX): HIV SCREEN 4TH GENERATION: NONREACTIVE

## 2017-05-15 MED ORDER — AZITHROMYCIN 250 MG PO TABS: 250.0000 mg | ORAL_TABLET | Freq: Every day | ORAL | 0 refills | Status: DC

## 2017-05-15 MED ORDER — GUAIFENESIN-DM 100-10 MG/5ML PO SYRP
10.0000 mL | ORAL_SOLUTION | ORAL | Status: DC | PRN
  Administered 2017-05-15: 10 mL via ORAL
  Filled 2017-05-15: qty 10

## 2017-05-15 MED ORDER — LOSARTAN POTASSIUM 50 MG PO TABS: 50.0000 mg | ORAL_TABLET | Freq: Every day | ORAL | 3 refills | Status: DC

## 2017-05-15 MED ORDER — TIOTROPIUM BROMIDE MONOHYDRATE 18 MCG IN CAPS: 18.0000 ug | ORAL_CAPSULE | Freq: Every day | RESPIRATORY_TRACT | 2 refills | Status: AC

## 2017-05-15 MED ORDER — PANTOPRAZOLE SODIUM 40 MG PO TBEC: 40.0000 mg | DELAYED_RELEASE_TABLET | Freq: Every day | ORAL | 1 refills | Status: DC

## 2017-05-15 MED ORDER — NICOTINE 21 MG/24HR TD PT24: 21.0000 mg | MEDICATED_PATCH | Freq: Every day | TRANSDERMAL | 1 refills | Status: DC

## 2017-05-15 MED ORDER — ACETAMINOPHEN 325 MG PO TABS
650.0000 mg | ORAL_TABLET | Freq: Four times a day (QID) | ORAL | Status: DC | PRN
  Administered 2017-05-15: 650 mg via ORAL
  Filled 2017-05-15: qty 2

## 2017-05-15 MED ORDER — ALBUTEROL SULFATE HFA 108 (90 BASE) MCG/ACT IN AERS: 2.0000 | INHALATION_SPRAY | Freq: Four times a day (QID) | RESPIRATORY_TRACT | 2 refills | Status: AC | PRN

## 2017-05-15 MED ORDER — PHENOL 1.4 % MT LIQD
1.0000 | OROMUCOSAL | Status: DC | PRN
  Administered 2017-05-15: 1 via OROMUCOSAL
  Filled 2017-05-15: qty 177

## 2017-05-15 MED ORDER — GUAIFENESIN-DM 100-10 MG/5ML PO SYRP: 10.0000 mL | ORAL_SOLUTION | Freq: Four times a day (QID) | ORAL | 0 refills | Status: DC | PRN

## 2017-05-15 MED ORDER — CEFUROXIME AXETIL 500 MG PO TABS: 500.0000 mg | ORAL_TABLET | Freq: Two times a day (BID) | ORAL | 0 refills | Status: AC

## 2017-05-15 NOTE — ED Provider Notes (Signed)
[facility] Provider [facility]   CSN: [id number] Arrival date & time: 05/14/17  1741     History   Chief Complaint Chief Complaint  Patient presents with  . Cough    HPI Bary CastillaDeborah A Dellis is a 61 y.o. female with history of COPD, hypertension who presents with a 2-week history of cough.  Patient has had some mild shortness of breath.  She denies chest pain.  She has been taking over-the-counter medicines without relief.  She continues to smoke.  She denies any fevers at home.  She denies any abdominal pain or vomiting, or urinary symptoms.  She has had some associated fatigue and decreased appetite.  HPI  Past Medical History:  Diagnosis Date  . Anxiety   . COPD (chronic obstructive pulmonary disease) (HCC)   . Heart murmur   . Hypertension     Patient Active Problem List   Diagnosis Date Noted  . Hypertension 05/14/2017  . Anxiety 05/14/2017  . CAP (community acquired pneumonia) 05/14/2017  . Current smoker 05/14/2017    Past Surgical History:  Procedure Laterality Date  . NECK SURGERY      OB History    No data available       Home Medications    Prior to Admission medications   Medication Sig Start Date End Date Taking? Authorizing Provider  aspirin EC 81 MG tablet Take 81 mg by mouth daily.   Yes [provider]  atorvastatin (LIPITOR) 40 MG tablet Take 40 mg by mouth daily.   Yes [provider]  busPIRone (BUSPAR) 15 MG tablet Take 15 mg by mouth 2 (two) times daily.   Yes [provider]  clonazePAM (KLONOPIN) 0.5 MG tablet Take 0.5 mg by mouth 2 (two) times daily as needed for anxiety.   Yes [provider]  FLUoxetine (PROZAC) 20 MG capsule Take 60 mg by mouth daily.   Yes [provider]  HYDROcodone-acetaminophen (NORCO/VICODIN) 5-325 MG per tablet Take 1-2 tablets by mouth every 6 (six) hours as needed for pain. 12/02/12  Yes Laisure, Herbert SetaHeather, PA-C  Ibuprofen-Diphenhydramine Cit  (IBUPROFEN PM) 200-38 MG TABS Take 2 tablets by mouth as needed (for pain/sleep).   Yes [provider]  lisinopril (PRINIVIL,ZESTRIL) 40 MG tablet Take 40 mg by mouth daily.   Yes [provider]  zolpidem (AMBIEN) 10 MG tablet Take 10 mg by mouth at bedtime as needed for sleep.   Yes [provider]    Family History Family History  Problem Relation Age of Onset  . Sudden Cardiac Death Neg Hx     Social History Social History   Tobacco Use  . Smoking status: Current Every Day Smoker  . Smokeless tobacco: Never Used  Substance Use Topics  . Alcohol use: Yes  . Drug use: No     Allergies   Penicillins   Review of Systems Review of Systems  Constitutional: Positive for appetite change and fatigue. Negative for chills and fever.  HENT: Negative for facial swelling and sore throat.   Respiratory: Positive for cough and shortness of breath.   Cardiovascular: Negative for chest pain.  Gastrointestinal: Negative for abdominal pain, nausea and vomiting.  Genitourinary: Negative for dysuria.  Musculoskeletal: Negative for back pain.  Skin: Negative for rash and wound.  Neurological: Negative for headaches.  Psychiatric/Behavioral: The patient is not nervous/anxious.      Physical Exam Updated Vital Signs BP 133/74 (BP Location: Right Arm)   Pulse 87  Temp 98.3 F (36.8 C) (Oral)   Resp 18   Ht 5\' 4"  (1.626 m)   Wt 72.6 kg (160 lb)   SpO2 95%   BMI 27.46 kg/m   Physical Exam  Constitutional: She appears well-developed and well-nourished. No distress.  HENT:  Head: Normocephalic and atraumatic.  Mouth/Throat: Oropharynx is clear and moist. No oropharyngeal exudate.  Eyes: Conjunctivae are normal. Pupils are equal, round, and reactive to light. Right eye exhibits no discharge. Left eye exhibits no discharge. No scleral icterus.  Neck: Normal range of motion. Neck supple. No thyromegaly present.  Cardiovascular: Normal rate, regular  rhythm, normal heart sounds and intact distal pulses. Exam reveals no gallop and no friction rub.  No murmur heard. Pulmonary/Chest: Effort normal. No stridor. No respiratory distress. She has no wheezes. She has rales (R and L bases ).  Abdominal: Soft. Bowel sounds are normal. She exhibits no distension. There is no tenderness. There is no rebound and no guarding.  Musculoskeletal: She exhibits no edema.  Lymphadenopathy:    She has no cervical adenopathy.  Neurological: She is alert. Coordination normal.  Skin: Skin is warm and dry. No rash noted. She is not diaphoretic. No pallor.  Psychiatric: She has a normal mood and affect.  Nursing note and vitals reviewed.    ED Treatments / Results  Labs (all labs ordered are listed, but only abnormal results are displayed) Labs Reviewed  CBC WITH DIFFERENTIAL/PLATELET - Abnormal; Notable for the following components:      Result Value   WBC 12.1 (*)    Neutro Abs 9.1 (*)    All other components within normal limits  COMPREHENSIVE METABOLIC PANEL - Abnormal; Notable for the following components:   Glucose, Bld 168 (*)    Calcium 8.8 (*)    Total Protein 6.4 (*)    Alkaline Phosphatase 132 (*)    All other components within normal limits  CULTURE, BLOOD (ROUTINE X 2)  CULTURE, BLOOD (ROUTINE X 2)  CULTURE, EXPECTORATED SPUTUM-ASSESSMENT  GRAM STAIN  RESPIRATORY PANEL BY PCR  PROCALCITONIN  HIV ANTIBODY (ROUTINE TESTING)  STREP PNEUMONIAE URINARY ANTIGEN  BASIC METABOLIC PANEL  CBC WITH DIFFERENTIAL/PLATELET  PROCALCITONIN  I-STAT CG4 LACTIC ACID, ED    EKG  EKG Interpretation None       Radiology Dg Chest 2 View  Result Date: 05/14/2017 CLINICAL DATA:  61 year old female with cough and shortness of breath for 2 weeks. EXAM: CHEST  2 VIEW COMPARISON:  09/17/2012. FINDINGS: The heart size and mediastinal contours are within normal limits. Slightly increased ill-defined opacities at the right lung base and left mid and  upper lungs noted. No sizable pleural effusion or pneumothorax. Stable postoperative changes of the cervical spine. The visualized skeletal structures are otherwise unremarkable. IMPRESSION: Vague ill-defined opacity involving the right lung base and left mid and upper lungs concerning for multifocal pneumonia. Follow-up to resolution recommended. Electronically Signed   By: Sande BrothersSerena  Chacko M.D.   On: 05/14/2017 18:29    Procedures Procedures (including critical care time)  Medications Ordered in ED Medications  aspirin EC tablet 81 mg (not administered)  clonazePAM (KLONOPIN) tablet 0.5 mg (not administered)  FLUoxetine (PROZAC) capsule 60 mg (not administered)  HYDROcodone-acetaminophen (NORCO/VICODIN) 5-325 MG per tablet 1-2 tablet (not administered)  lisinopril (PRINIVIL,ZESTRIL) tablet 40 mg (not administered)  enoxaparin (LOVENOX) injection 40 mg (40 mg Subcutaneous Given 05/15/17 0023)  cefTRIAXone (ROCEPHIN) 1 g in dextrose 5 % 50 mL IVPB (not administered)  azithromycin (ZITHROMAX) 500 mg  in dextrose 5 % 250 mL IVPB (not administered)  zolpidem (AMBIEN) tablet 5 mg (5 mg Oral Given 05/15/17 0028)  nicotine (NICODERM CQ - dosed in mg/24 hours) patch 21 mg (21 mg Transdermal Patch Applied 05/14/17 2331)  ipratropium-albuterol (DUONEB) 0.5-2.5 (3) MG/3ML nebulizer solution 3 mL (not administered)  phenol (CHLORASEPTIC) mouth spray 1 spray (1 spray Mouth/Throat Given 05/15/17 0020)  cefTRIAXone (ROCEPHIN) 1 g in dextrose 5 % 50 mL IVPB (0 g Intravenous Stopped 05/14/17 2258)  azithromycin (ZITHROMAX) 500 mg in dextrose 5 % 250 mL IVPB (0 mg Intravenous Stopped 05/14/17 2322)     Initial Impression / Assessment and Plan / ED Course  I have reviewed the triage vital signs and the nursing notes.  Pertinent labs & imaging results that were available during my care of the patient were reviewed by me and considered in my medical decision making (see chart for details).     Patient with  multifocal community-acquired pneumonia.  CBC shows WBC 12.1.  Labs otherwise unremarkable.  Considering patient's COPD status and shortness of breath, will admit for IV antibiotics.  Blood cultures pending Rocephin and azithromycin initiated in the ED.  I consulted [facility] and Dr. Antionette Char will admit the patient for further evaluation and treatment.  Patient also evaluated by Dr. Ethelda Chick who had the patient's management and agrees with plan.  Final Clinical Impressions(s) / ED Diagnoses   Final diagnoses:  Community acquired pneumonia, unspecified laterality  Multifocal pneumonia    ED Discharge Orders    None       Emi Holes, PA-C 05/15/17 0139    Doug Sou, MD 05/15/17 [phone]

## 2017-05-15 NOTE — Progress Notes (Signed)
Patient arrived to the unit via wheelchair from the Emergency department. Patient is alert and oriented x4.  Complaints of sore throat.  Skin assessment complete.  No skin issues.  Iv intact to the right hand.  Educated the patient on how to reach the staff on the unit. Lowered the bed and placed the call light within reach.  Will continue to monitor the patient.

## 2017-05-15 NOTE — Discharge Summary (Signed)
Physician Discharge Summary  Molly [name] ZOX:[id number] DOB: Oct 30, 1955 DOA: 05/14/2017  PCP: Molly NicholsonAugust Saucerean, MD  Admit date: 05/14/2017 Discharge date: 05/15/2017  Time spent: 35 minutes  Recommendations for Outpatient Follow-up:  1. Repeat BMET to follow electrolytes and renal function 2. Please reassess BP and adjust medications as needed 3. Arrange follow up with pulmonary service for PFT's and further evaluation of COPD 4. Repeat CXR in 4-6 weeks to assure resolution of infiltrates    Discharge Diagnoses:  Principal Problem:   Multifocal pneumonia Active Problems:   Hypertension   Depression with anxiety   Current smoker   Gastroesophageal reflux disease   COPD mixed type (HCC)   Discharge Condition: stable and improved. Will discharge home with instructions to follow up with PCP in 10 days.  Diet recommendation: heart healthy diet   Filed Weights   05/14/17 1750  Weight: 72.6 kg (160 lb)    History of present illness:  As per H&P written by Dr. Antionette [name] on 11/23 61 y.o. female with medical history significant for anxiety, hypertension, and chronic pain, now presenting to the emergency department for evaluation of shortness of breath and productive cough.  Patient reports that she was in her usual state of health until approximately 1-2 weeks ago when she noted the insidious development of dyspnea and cough.  Over the ensuing days, her condition is progressively worsened, marked by increasing dyspnea, now with symptoms at rest, and increasingly frequent cough, now productive of thick white sputum.  She reports some subjective fevers at home but has not taken her temperature.  She denies any rhinorrhea, sick contacts, or recent long distance travel.  She reports that she was vaccinated against seasonal influenza this year.  No chest pain, and no lower extremity swelling or tenderness.   Hospital Course:  1-multifocal PNA: community acquired -patient afebrile, not  having CP, with improved breathing and no longer tachypneic -no nausea, no vomiting -will discharge home on oral antibiotics, PRN inhaler, PRN antitussive and instructions to follow up with PCP in 10 days. -patient advise to quit smoking   2-COPD -according to patient PFT's has been done, so suspected at this moment -encourage her to stop smoking -will discharge on spiriva and PRN albuterol -patient will benefit of PFT's and pulmonary evaluation as an outpatient  3-HTN -will stop lisinopril (due to increase chances of coughing related to medication) -patient started on losartan instead -advise to follow heart healthy diet   4-GERD -started on PPI  5-tobacco abuse -cessation counseling provided -nicotine patch prescribed   6-depression/anxiety -no SI and no hallucinations  -continue home regimen   Procedures:  See below for x-ray reports   Consultations:  None   Discharge Exam: Vitals:   05/15/17 0016 05/15/17 0605  BP: 133/74 135/75  Pulse: 87 89  Resp: 18 18  Temp: 98.3 F (36.8 C) 98 F (36.7 C)  SpO2: 95% 98%    General:afebrile, no CP, no nausea, no vomiting. Patient reporting intermittent coughing spells, but overall feeling better. Patient able to speak in full sentences and demonstrating no resp distress. Cardiovascular: S1 and S2, no rubs, no gallops Respiratory: good air movement, mild exp wheezing, no crackles, normal resp effort and no uising accessory muscles. Abd: soft, NT, ND, positive BS Extremities: no edema, no cyanosis, no clubbing   Discharge Instructions   Discharge Instructions    Diet - low sodium heart healthy   Complete by:  As directed    Discharge instructions   Complete by:  As directed    Keep yourself well hydrated Take medications as prescribed Please follow up with PCP in 10 days Stop smoking     Current Discharge Medication List    START taking these medications   Details  albuterol (PROVENTIL HFA;VENTOLIN HFA)  108 (90 Base) MCG/ACT inhaler Inhale 2 puffs into the lungs every 6 (six) hours as needed for wheezing or shortness of breath. Qty: 1 Inhaler, Refills: 2    azithromycin (ZITHROMAX Z-PAK) 250 MG tablet Take 1 tablet (250 mg total) by mouth daily. Take 2 tablets (500 mg) on  Day 1,  followed by 1 tablet (250 mg) once daily on Days 2 through 5. Qty: 6 each, Refills: 0    cefUROXime (CEFTIN) 500 MG tablet Take 1 tablet (500 mg total) by mouth 2 (two) times daily for 7 days. Qty: 14 tablet, Refills: 0    guaiFENesin-dextromethorphan (ROBITUSSIN DM) 100-10 MG/5ML syrup Take 10 mLs by mouth every 6 (six) hours as needed for cough (chest congestion). Qty: 354 mL, Refills: 0    losartan (COZAAR) 50 MG tablet Take 1 tablet (50 mg total) by mouth daily. Qty: 30 tablet, Refills: 3    nicotine (NICODERM CQ - DOSED IN MG/24 HOURS) 21 mg/24hr patch Place 1 patch (21 mg total) onto the skin daily. Qty: 28 patch, Refills: 1    pantoprazole (PROTONIX) 40 MG tablet Take 1 tablet (40 mg total) by mouth daily. Qty: 30 tablet, Refills: 1    tiotropium (SPIRIVA HANDIHALER) 18 MCG inhalation capsule Place 1 capsule (18 mcg total) into inhaler and inhale daily. Qty: 30 capsule, Refills: 2      CONTINUE these medications which have NOT CHANGED   Details  aspirin EC 81 MG tablet Take 81 mg by mouth daily.    atorvastatin (LIPITOR) 40 MG tablet Take 40 mg by mouth daily.    busPIRone (BUSPAR) 15 MG tablet Take 15 mg by mouth 2 (two) times daily.    clonazePAM (KLONOPIN) 0.5 MG tablet Take 0.5 mg by mouth 2 (two) times daily as needed for anxiety.    FLUoxetine (PROZAC) 20 MG capsule Take 60 mg by mouth daily.    HYDROcodone-acetaminophen (NORCO/VICODIN) 5-325 MG per tablet Take 1-2 tablets by mouth every 6 (six) hours as needed for pain. Qty: 20 tablet, Refills: 0    Ibuprofen-Diphenhydramine Cit (IBUPROFEN PM) 200-38 MG TABS Take 2 tablets by mouth as needed (for pain/sleep).    zolpidem (AMBIEN) 10  MG tablet Take 10 mg by mouth at bedtime as needed for sleep.      STOP taking these medications     lisinopril (PRINIVIL,ZESTRIL) 40 MG tablet        Allergies  Allergen Reactions  . Penicillins Hives   Follow-up Information    Molly NicholsonAugust Saucerean, MD. Schedule an appointment as soon as possible for a visit in 10 day(s).   Specialty:  Family Medicine Contact information: [address] [place] [address] [phone]           The results of significant diagnostics from this hospitalization (including imaging, microbiology, ancillary and laboratory) are listed below for reference.    Significant Diagnostic Studies: Dg Chest 2 View  Result Date: 05/14/2017 CLINICAL DATA:  61 year old female with cough and shortness of breath for 2 weeks. EXAM: CHEST  2 VIEW COMPARISON:  09/17/2012. FINDINGS: The heart size and mediastinal contours are within normal limits. Slightly increased ill-defined opacities at the right lung base and left mid and upper  lungs noted. No sizable pleural effusion or pneumothorax. Stable postoperative changes of the cervical spine. The visualized skeletal structures are otherwise unremarkable. IMPRESSION: Vague ill-defined opacity involving the right lung base and left mid and upper lungs concerning for multifocal pneumonia. Follow-up to resolution recommended. Electronically Signed   By: Sande Brothers M.D.   On: 05/14/2017 18:29   Microbiology: No results found for this or any previous visit (from the past 240 hour(s)).   Labs: Basic Metabolic Panel: Recent Labs  Lab 05/14/17 2033 05/15/17 0325  NA 136 139  K 3.9 3.8  CL 101 104  CO2 27 28  GLUCOSE 168* 114*  BUN 11 12  CREATININE 0.63 0.65  CALCIUM 8.8* 8.6*   Liver Function Tests: Recent Labs  Lab 05/14/17 2033  AST 23  ALT 23  ALKPHOS 132*  BILITOT 0.4  PROT 6.4*  ALBUMIN 3.6   CBC: Recent Labs  Lab 05/14/17 2033 05/15/17 0325  WBC 12.1* 6.8  NEUTROABS 9.1*  4.0  HGB 14.9 13.6  HCT 44.9 42.1  MCV 97.0 97.5  PLT 243 209    Signed:  Vassie Loll MD.  [facility] 05/15/2017, 12:24 PM

## 2017-05-20 LAB — CULTURE, BLOOD (ROUTINE X 2)
Culture: NO GROWTH
Culture: NO GROWTH
Special Requests: ADEQUATE
Special Requests: ADEQUATE

## 2017-11-19 ENCOUNTER — Ambulatory Visit
Admission: RE | Admit: 2017-11-19 | Discharge: 2017-11-19 | Disposition: A | Discharge status: Home / Self Care | Payer: Medicare Other | Source: Ambulatory Visit | Attending: Family Medicine | Admitting: Family Medicine

## 2017-11-19 ENCOUNTER — Other Ambulatory Visit: Payer: Self-pay | Admitting: Family Medicine

## 2017-11-19 DIAGNOSIS — R079 Chest pain, unspecified: Secondary | ICD-10-CM

## 2017-12-15 ENCOUNTER — Encounter: Payer: Self-pay | Admitting: Physician Assistant

## 2017-12-15 ENCOUNTER — Ambulatory Visit: Payer: Medicare Other | Admitting: Physician Assistant

## 2017-12-15 ENCOUNTER — Encounter

## 2017-12-15 VITALS — BP 138/82 | HR 95 | Ht 64.0 in | Wt 164.0 lb

## 2017-12-15 DIAGNOSIS — R079 Chest pain, unspecified: Secondary | ICD-10-CM

## 2017-12-15 DIAGNOSIS — I10 Essential (primary) hypertension: Secondary | ICD-10-CM

## 2017-12-15 DIAGNOSIS — R002 Palpitations: Secondary | ICD-10-CM

## 2017-12-15 NOTE — Progress Notes (Signed)
Cardiology Office Note   Date:  12/15/2017   ID:  Molly [name], DOB 20-May-1956, MRN [id number]  PCP:  Clovis Riley, L.August Saucer, MD  Cardiologist: New, Dr Venora Maples, PA-C    History of Present Illness: Molly [name] is a 62 y.o. female with a history of DM, HTN, HLD, COPD, SEM, anxiety/depression, tob use, GERD  Molly [name] presents for cardiology evaluation.  Her PCP sent her over for chest pain.  She has had a heart murmur since the age of 62.  The chest pain has been going on for a couple of months. It will always start at rest, lying or sitting. Sx will last 10 min or so. No rx tried. No change w/ deep inspiration. Gets it approx 2 x week. Pain is under L breast, is sharp and stabbing, 5/10.   The pain makes her a little SOB. She also gets achy in her upper L arm.   She has been having problems w/ nausea and diaphoresis, can be any time of day. Also happens w/ the chest pain. She will drink something to settle her stomach, that sometimes helps. Mylanta and Tums no help. She will also get light-headed if she tries to walk during these episodes.   She gets palpitations, feels her heart race. This happens when she feels anxious, thinks they happen at the same time. She is on medication for the panic attacks, still gets them  1-2 x week.   The nausea and diaphoresis do not happen during the panic attacks.   No hx exertional CP, but does get SOB. No recent change. Does not wake w/ LE edema. No orthopnea or PND.   Past Medical History:  Diagnosis Date  . Anxiety   . COPD (chronic obstructive pulmonary disease) (HCC)   . Heart murmur    dx at age 1  . Hypertension     Past Surgical History:  Procedure Laterality Date  . NECK SURGERY      Current Outpatient Medications  Medication Sig Dispense Refill  . albuterol (PROVENTIL HFA;VENTOLIN HFA) 108 (90 Base) MCG/ACT inhaler Inhale 2 puffs into the lungs every 6 (six) hours as needed for wheezing or shortness  of breath. 1 Inhaler 2  . aspirin EC 81 MG tablet Take 81 mg by mouth daily.    Marland Kitchen atorvastatin (LIPITOR) 40 MG tablet Take 40 mg by mouth daily.    . busPIRone (BUSPAR) 15 MG tablet Take 15 mg by mouth 2 (two) times daily.    . clonazePAM (KLONOPIN) 0.5 MG tablet Take 0.5 mg by mouth 2 (two) times daily as needed for anxiety.    . cyclobenzaprine (FLEXERIL) 10 MG tablet Take 10 mg by mouth 3 (three) times daily as needed for muscle spasms.    Marland Kitchen FLUoxetine (PROZAC) 20 MG capsule Take 60 mg by mouth daily.    Marland Kitchen guaiFENesin-dextromethorphan (ROBITUSSIN DM) 100-10 MG/5ML syrup Take 10 mLs by mouth every 6 (six) hours as needed for cough (chest congestion). 354 mL 0  . Ibuprofen-Diphenhydramine Cit (IBUPROFEN PM) 200-38 MG TABS Take 2 tablets by mouth as needed (for pain/sleep).    Marland Kitchen telmisartan (MICARDIS) 40 MG tablet Take 40 mg by mouth daily.  12  . tiotropium (SPIRIVA HANDIHALER) 18 MCG inhalation capsule Place 1 capsule (18 mcg total) into inhaler and inhale daily. 30 capsule 2  . zolpidem (AMBIEN) 10 MG tablet Take 10 mg by mouth at bedtime as needed for sleep.     No current  facility-administered medications for this visit.     Allergies:   Penicillins    Social History   Socioeconomic History  . Marital status: Legally Separated    Spouse name: Not on file  . Number of children: Not on file  . Years of education: Not on file  . Highest education level: Not on file  Occupational History  . Occupation: Disabled due to back problems.  Social Needs  . Financial resource strain: Not on file  . Food insecurity:    Worry: Not on file    Inability: Not on file  . Transportation needs:    Medical: Not on file    Non-medical: Not on file  Tobacco Use  . Smoking status: Current Every Day Smoker    Packs/day: 0.50    Years: 50.00    Pack years: 25.00    Types: Cigarettes  . Smokeless tobacco: Never Used  Substance and Sexual Activity  . Alcohol use: Yes  . Drug use: No  . Sexual  activity: Not on file  Lifestyle  . Physical activity:    Days per week: Not on file    Minutes per session: Not on file  . Stress: Not on file  Relationships  . Social connections:    Talks on phone: Not on file    Gets together: Not on file    Attends religious service: Not on file    Active member of club or organization: Not on file    Attends meetings of clubs or organizations: Not on file    Relationship status: Not on file  . Intimate partner violence:    Fear of current or ex partner: Not on file    Emotionally abused: Not on file    Physically abused: Not on file    Forced sexual activity: Not on file  Other Topics Concern  . Not on file  Social History Narrative  . Not on file   Family History:  The patient's family history includes Heart failure in her father.  Family Status: The patient indicated that her mother is alive. She indicated that her father is deceased. She indicated that her sister is alive. She indicated that her maternal grandmother is deceased. She indicated that her maternal grandfather is deceased. She indicated that her paternal grandmother is deceased. She indicated that her paternal grandfather is deceased. She indicated that the status of her neg hx is unknown.  ROS:  Please see the history of present illness. All other systems are reviewed and negative.   PHYSICAL EXAM: VS:  BP 138/82   Pulse 95   Ht 5\' 4"  (1.626 m)   Wt 164 lb (74.4 kg)   BMI 28.15 kg/m  , BMI Body mass index is 28.15 kg/m. GEN: Well nourished, well developed, female in no acute distress  HEENT: normal for age  Neck: no JVD, no carotid bruit, no masses Cardiac: RRR; minimal murmur, no rubs, or gallops Respiratory:  clear to auscultation bilaterally, normal work of breathing GI: soft, nontender, nondistended, + BS MS: no deformity or atrophy; no edema; distal pulses are 2+ in all 4 extremities   Skin: warm and dry, no rash Neuro:  Strength and sensation are intact Psych:  euthymic mood, full affect   EKG:  EKG is ordered today. The ekg ordered today demonstrates SR, HR 94, non-specific intraventricular conduction defect, QRS 82 ms, Q waves III only, no old to compare.  Recent Labs: 05/14/2017: ALT 23 05/15/2017: BUN 12; Creatinine, Ser 0.65;  Hemoglobin 13.6; Platelets 209; Potassium 3.8; Sodium 139    Lipid Panel No results found for: CHOL, TRIG, HDL, CHOLHDL, VLDL, LDLCALC, LDLDIRECT   Wt Readings from Last 3 Encounters:  12/15/17 164 lb (74.4 kg)  05/14/17 160 lb (72.6 kg)  12/02/12 150 lb (68 kg)     Other studies Reviewed: Additional studies/ records that were reviewed today include: Records from Dr [name], Epic records.  ASSESSMENT AND PLAN: The patient and plan were reviewed with Dr Tresa EndoKelly, who agrees.  1.  Chest pain/L arm pain: sx have typical and atypical features, not exertional, but she also has L arm radiation, DOE and other sx>>Myoview stress test  2. Nausea and diaphoresis: need to make sure arrhythmia is not causing this>>Event monitor. - if this is negative, consider side effects from meds or hormonal (?adrenal/TSH) abnormality.  3. Palpitations: She gets these in the context of panic attacks, but need to make sure an arrhythmia is not causing the panic attack>>event monitor.   4. HTN: BP slightly above target of 130/80, but she is a little anxious about being here today. Management per Dr [name].  5. Tob use: cessation advised    Current medicines are reviewed at length with the patient today.  The patient does not have concerns regarding medicines.  The following changes have been made:  no change  Labs/ tests ordered today include:   Orders Placed This Encounter  Procedures  . MYOCARDIAL PERFUSION IMAGING  . CARDIAC EVENT MONITOR  . EKG 12-Lead    Disposition:   FU with Dr Tresa EndoKelly  Signed, Theodore Demarkhonda Korissa Horsford, PA-C  12/15/2017 10:35 AM    [facility] Phone: [phone]; Fax: [phone])  [phone]  This note was written with the assistance of speech recognition software. Please excuse any transcriptional errors.

## 2017-12-15 NOTE — Patient Instructions (Signed)
Medication Instructions: Your physician recommends that you continue on your current medications as directed. Please refer to the Current Medication list given to you today.\   Testing/Procedures: Your physician has requested that you have an exercise stress myoview. For further information please visit [email or web address]. Please follow instruction sheet, as given.  Your physician has recommended that you wear a 30 day event monitor. Event monitors are medical devices that record the heart's electrical activity. Doctors most often us these monitors to diagnose arrhythmias. Arrhythmias are problems with the speed or rhythm of the heartbeat. The monitor is a small, portable device. You can wear one while you do your normal daily activities. This is usually used to diagnose what is causing palpitations/syncope (passing out).  Follow-Up: Your physician recommends that you schedule a follow-up appointment in: 8 weeks with Dr. Tresa EndoKelly or Theodore Demarkhonda Barrett, PA-C.

## 2017-12-22 ENCOUNTER — Other Ambulatory Visit: Payer: Self-pay | Admitting: Physician Assistant

## 2017-12-22 ENCOUNTER — Ambulatory Visit (INDEPENDENT_AMBULATORY_CARE_PROVIDER_SITE_OTHER): Payer: Medicare Other

## 2017-12-22 DIAGNOSIS — R002 Palpitations: Secondary | ICD-10-CM

## 2017-12-24 ENCOUNTER — Telehealth (HOSPITAL_COMMUNITY): Payer: Self-pay

## 2017-12-24 NOTE — Telephone Encounter (Signed)
Encounter complete. 

## 2017-12-29 ENCOUNTER — Ambulatory Visit (HOSPITAL_COMMUNITY)
Admission: RE | Admit: 2017-12-29 | Discharge: 2017-12-29 | Disposition: A | Discharge status: Home / Self Care | Payer: Medicare Other | Source: Ambulatory Visit | Attending: Cardiology | Admitting: Cardiology

## 2017-12-29 DIAGNOSIS — R079 Chest pain, unspecified: Secondary | ICD-10-CM | POA: Diagnosis present

## 2017-12-29 LAB — MYOCARDIAL PERFUSION IMAGING
CHL CUP NUCLEAR SDS: 1
CHL CUP NUCLEAR SRS: 0
LV dias vol: 59 mL (ref 46–106)
LVSYSVOL: 15 mL
Peak HR: 108 {beats}/min
Rest HR: 92 {beats}/min
SSS: 1
TID: 1.29

## 2017-12-29 MED ORDER — TECHNETIUM TC 99M TETROFOSMIN IV KIT
9.9000 | PACK | Freq: Once | INTRAVENOUS | Status: AC | PRN
  Administered 2017-12-29: 9.9 via INTRAVENOUS
  Filled 2017-12-29: qty 10

## 2017-12-29 MED ORDER — REGADENOSON 0.4 MG/5ML IV SOLN
0.4000 mg | Freq: Once | INTRAVENOUS | Status: AC
  Administered 2017-12-29: 0.4 mg via INTRAVENOUS

## 2017-12-29 MED ORDER — TECHNETIUM TC 99M TETROFOSMIN IV KIT
32.0000 | PACK | Freq: Once | INTRAVENOUS | Status: AC | PRN
  Administered 2017-12-29: 32 via INTRAVENOUS
  Filled 2017-12-29: qty 32

## 2018-01-07 ENCOUNTER — Telehealth: Payer: Self-pay | Admitting: Physician Assistant

## 2018-01-07 NOTE — Telephone Encounter (Signed)
Notes recorded by Darrol JumpBarrett, Rhonda G, PA-C on 12/30/2017 at 10:56 AM EDT Please let her know that her stress test was fine. Normal heart muscle function and perfusion. Complete event monitor. F/u w/ PCP for non-cardiac causes of CP. Thanks  Patient aware of results

## 2018-01-07 NOTE — Telephone Encounter (Signed)
New message ° ° °Patient calling for stress test results °

## 2018-02-16 ENCOUNTER — Encounter: Payer: Self-pay | Admitting: Physician Assistant

## 2018-02-16 ENCOUNTER — Ambulatory Visit: Payer: Medicare Other | Admitting: Physician Assistant

## 2018-02-16 VITALS — BP 115/70 | HR 98 | Ht 64.0 in | Wt 159.4 lb

## 2018-02-16 DIAGNOSIS — R739 Hyperglycemia, unspecified: Secondary | ICD-10-CM | POA: Diagnosis not present

## 2018-02-16 DIAGNOSIS — R002 Palpitations: Secondary | ICD-10-CM | POA: Diagnosis not present

## 2018-02-16 DIAGNOSIS — R079 Chest pain, unspecified: Secondary | ICD-10-CM | POA: Diagnosis not present

## 2018-02-16 DIAGNOSIS — E785 Hyperlipidemia, unspecified: Secondary | ICD-10-CM

## 2018-02-16 NOTE — Progress Notes (Signed)
Cardiology Office Note   Date:  02/16/2018   ID:  Phebe CollaDeborah A [name], DOB May 05, 1956, MRN [id number]  PCP:  [name], L.[name], MD  Cardiologist: Dr. Tresa EndoKelly (assigned at 6/26 office visit) Molly Demarkhonda Cerise Lieber, PA-C   Chief Complaint  Patient presents with  . Follow-up  . Edema    hands and feet, cramping in feet    History of Present Illness: Molly CastillaDeborah A [name] is a 62 y.o. female with a history of DM, HTN, HLD, COPD, SEM, anxiety/depression, tob use, GERD  6/26 office visit, patient with chest pain, stress test ordered, patient complains of palpitations, nausea/diaphoresis, event monitor ordered  Molly Castillaeborah A [name] presents for cardiology follow up.  She drinks caffeinated beverages daily. She does not drink water, but can. Is willing to drink caffeine-free beverages. She takes 2 BC powders daily, for back pain.   She has had a few episodes of chest pain. It usually happens when she lays down to go to bed.     Has never been treated for reflux. Gets heartburn occasionally. Takes Tums.  No lower extremity edema, no orthopnea or PND.  She continues to smoke but is trying to cut back.   Past Medical History:  Diagnosis Date  . Anxiety   . COPD (chronic obstructive pulmonary disease) (HCC)   . Heart murmur    dx at age 89  . Hypertension     Past Surgical History:  Procedure Laterality Date  . NECK SURGERY      Current Outpatient Medications  Medication Sig Dispense Refill  . albuterol (PROVENTIL HFA;VENTOLIN HFA) 108 (90 Base) MCG/ACT inhaler Inhale 2 puffs into the lungs every 6 (six) hours as needed for wheezing or shortness of breath. 1 Inhaler 2  . aspirin EC 81 MG tablet Take 81 mg by mouth daily.    Marland Kitchen. atorvastatin (LIPITOR) 40 MG tablet Take 40 mg by mouth daily.    . busPIRone (BUSPAR) 15 MG tablet Take 15 mg by mouth 2 (two) times daily.    . clonazePAM (KLONOPIN) 0.5 MG tablet Take 0.5 mg by mouth 2 (two) times daily as needed for anxiety.    . cyclobenzaprine  (FLEXERIL) 10 MG tablet Take 10 mg by mouth 3 (three) times daily as needed for muscle spasms.    Marland Kitchen. FLUoxetine (PROZAC) 20 MG capsule Take 60 mg by mouth daily.    Marland Kitchen. guaiFENesin-dextromethorphan (ROBITUSSIN DM) 100-10 MG/5ML syrup Take 10 mLs by mouth every 6 (six) hours as needed for cough (chest congestion). 354 mL 0  . Ibuprofen-Diphenhydramine Cit (IBUPROFEN PM) 200-38 MG TABS Take 2 tablets by mouth as needed (for pain/sleep).    Marland Kitchen. telmisartan (MICARDIS) 40 MG tablet Take 40 mg by mouth daily.  12  . tiotropium (SPIRIVA HANDIHALER) 18 MCG inhalation capsule Place 1 capsule (18 mcg total) into inhaler and inhale daily. 30 capsule 2  . zolpidem (AMBIEN) 10 MG tablet Take 10 mg by mouth at bedtime as needed for sleep.     No current facility-administered medications for this visit.     Allergies:   Penicillins    Social History:  The patient  reports that she has been smoking cigarettes. She has a 25.00 pack-year smoking history. She has never used smokeless tobacco. She reports that she drank alcohol. She reports that she does not use drugs.   Family History:  The patient's family history includes Heart failure in her father.    ROS:  Please see the history of present illness. All other  systems are reviewed and negative.    PHYSICAL EXAM: VS:  BP 115/70   Pulse 98   Ht 5\' 4"  (1.626 m)   Wt 159 lb 6.4 oz (72.3 kg)   BMI 27.36 kg/m  , BMI Body mass index is 27.36 kg/m. GEN: Well nourished, well developed, female in no acute distress  HEENT: normal for age  Neck: no JVD, no carotid bruit, no masses Cardiac: RRR; soft murmur, no rubs, or gallops Respiratory: Scattered rales bilaterally, normal work of breathing GI: soft, nontender, nondistended, + BS MS: no deformity or atrophy; no edema; distal pulses are 2+ in all 4 extremities   Skin: warm and dry, no rash Neuro:  Strength and sensation are intact Psych: euthymic mood, full affect   EKG:  EKG is not ordered today.  EVENT  MONITOR Patient was monitored from July 3 through January 20, 2018. The average heart rate was 91 bpm. Frequent artifact was present. The patient was in sinus rhythm. She was in sinus tachycardia 18% of readable data. There was no significant ectopy. There were no pauses. There were no episodes of atrial fibrillation or atrial flutter.  MYOVIEW: 12/29/2017  The left ventricular ejection fraction is hyperdynamic (>65%).  Nuclear stress EF: 75%.  There was no ST segment deviation noted during stress.  The study is normal.  This is a low risk study.    Recent Labs: 05/14/2017: ALT 23 05/15/2017: BUN 12; Creatinine, Ser 0.65; Hemoglobin 13.6; Platelets 209; Potassium 3.8; Sodium 139    Lipid Panel No results found for: CHOL, TRIG, HDL, CHOLHDL, VLDL, LDLCALC, LDLDIRECT   Wt Readings from Last 3 Encounters:  02/16/18 159 lb 6.4 oz (72.3 kg)  12/29/17 164 lb (74.4 kg)  12/15/17 164 lb (74.4 kg)     Other studies Reviewed: Additional studies/ records that were reviewed today include: Office notes and testing.  ASSESSMENT AND PLAN:  1.  Chest pain: Her symptoms are atypical for angina.  They mostly occur in the evening at bedtime.  She drinks caffeinated beverages all day long and also takes BC powders for back pain.  We discussed how much caffeine she is getting on a daily basis.  She understands that it would help her to cut back on this. -She will try to drink more water. -Consider taking Tums at bedtime to see if she can minimize or relieve her symptoms.  2.  Systolic murmur: - She has a soft murmur. -I explained to her that she can follow-up with [place] once a year, if her murmur ever increases to the point that further testing is needed, we can do it  3.  Palpitations: - This is possibly related to caffeine use and drinks and in [facility] powders -I explained to her that although her heart rate was elevated above 118% of the time  Cardiac risk factor reduction: 4.   Hyperlipidemia: -Followed by her PCP -Recent labs by her PCP showed an LDL of 81 and HDL of 70. - Continue Lipitor per PCP  5.  Tobacco use: - Cessation was discussed and encouraged.  6.  Hyperglycemia: -On review of labs, her blood sugar has been increasing gradually over the last few years. - Check a hemoglobin A1c.  Additionally, with fatigue, will check a TSH -If the year of these are abnormal, have her follow-up with Dr. Clovis Riley   Current medicines are reviewed at length with the patient today.  The patient does not have concerns regarding medicines.  The following changes have been  made:  no change  Labs/ tests ordered today include:   Orders Placed This Encounter  Procedures  . Hemoglobin A1c  . TSH     Disposition:   FU with Dr. Tresa Endo or myself in a year  Signed, Molly Demark, PA-C  02/16/2018 2:01 PM    [facility] Phone: [phone]; Fax: [phone]  This note was written with the assistance of speech recognition software. Please excuse any transcriptional errors.

## 2018-02-16 NOTE — Patient Instructions (Addendum)
MEDICATION INSTRUCTIONS  STOP USING BC POWDER- INSTEAD TAKE 2 TABLETS OF ASPIRIN AND 2 HOURS LATER 2 TYLENOL ( CAN BE GENERIC)  TAKE 2 TUMS TABLET AT BEDTIME - TO HELP PREVENT CHEST PAIN.    Your physician discussed the hazards of tobacco use. Tobacco use cessation is recommended and techniques and options to help you quit were discussed. TRY TO QUIT.   INTAKE  TRY  GRADUAL TO DRINK WATER  AND CUT BACKAND TRY TO STOP  DRINKING BOTTLE TEAS AND SODAS.   LABS- TODAY. HGBA1C TSH    Your physician wants you to follow-up in 12 MONTHS WITH DR Tresa Endo OR RHONDA.You will receive a reminder letter in the mail two months in advance. If you don't receive a letter, please call our office to schedule the follow-up appointment.   If you need a refill on your cardiac medications before your next appointment, please call your pharmacy.    Coping with Quitting Smoking Quitting smoking is a physical and mental challenge. You will face cravings, withdrawal symptoms, and temptation. Before quitting, work with your health care provider to make a plan that can help you cope. Preparation can help you quit and keep you from giving in. How can I cope with cravings? Cravings usually last for 5-10 minutes. If you get through it, the craving will pass. Consider taking the following actions to help you cope with cravings:  Keep your mouth busy: ? Chew sugar-free gum. ? Suck on hard candies or a straw. ? Brush your teeth.  Keep your hands and body busy: ? Immediately change to a different activity when you feel a craving. ? Squeeze or play with a ball. ? Do an activity or a hobby, like making bead jewelry, practicing needlepoint, or working with wood. ? Mix up your normal routine. ? Take a short exercise break. Go for a quick walk or run up and down stairs. ? Spend time in public places where smoking is not allowed.  Focus on doing something kind or helpful for someone else.  Call a friend or family  member to talk during a craving.  Join a support group.  Call a quit line, such as 1-800-QUIT-NOW.  Talk with your health care provider about medicines that might help you cope with cravings and make quitting easier for you.  How can I deal with withdrawal symptoms? Your body may experience negative effects as it tries to get used to not having nicotine in the system. These effects are called withdrawal symptoms. They may include:  Feeling hungrier than normal.  Trouble concentrating.  Irritability.  Trouble sleeping.  Feeling depressed.  Restlessness and agitation.  Craving a cigarette.  To manage withdrawal symptoms:  Avoid places, people, and activities that trigger your cravings.  Remember why you want to quit.  Get plenty of sleep.  Avoid coffee and other caffeinated drinks. These may worsen some of your symptoms.  How can I handle social situations? Social situations can be difficult when you are quitting smoking, especially in the first few weeks. To manage this, you can:  Avoid parties, bars, and other social situations where people might be smoking.  Avoid alcohol.  Leave right away if you have the urge to smoke.  Explain to your family and friends that you are quitting smoking. Ask for understanding and support.  Plan activities with friends or family where smoking is not an option.  What are some ways I can cope with stress? Wanting to smoke may cause stress, and  stress can make you want to smoke. Find ways to manage your stress. Relaxation techniques can help. For example:  Breathe slowly and deeply, in through your nose and out through your mouth.  Listen to soothing, relaxing music.  Talk with a family member or friend about your stress.  Light a candle.  Soak in a bath or take a shower.  Think about a peaceful place.  What are some ways I can prevent weight gain? Be aware that many people gain weight after they quit smoking. However, not  everyone does. To keep from gaining weight, have a plan in place before you quit and stick to the plan after you quit. Your plan should include:  Having healthy snacks. When you have a craving, it may help to: ? Eat plain popcorn, crunchy carrots, celery, or other cut vegetables. ? Chew sugar-free gum.  Changing how you eat: ? Eat small portion sizes at meals. ? Eat 4-6 small meals throughout the day instead of 1-2 large meals a day. ? Be mindful when you eat. Do not watch television or do other things that might distract you as you eat.  Exercising regularly: ? Make time to exercise each day. If you do not have time for a long workout, do short bouts of exercise for 5-10 minutes several times a day. ? Do some form of strengthening exercise, like weight lifting, and some form of aerobic exercise, like running or swimming.  Drinking plenty of water or other low-calorie or no-calorie drinks. Drink 6-8 glasses of water daily, or as much as instructed by your health care provider.  Summary  Quitting smoking is a physical and mental challenge. You will face cravings, withdrawal symptoms, and temptation to smoke again. Preparation can help you as you go through these challenges.  You can cope with cravings by keeping your mouth busy (such as by chewing gum), keeping your body and hands busy, and making calls to family, friends, or a helpline for people who want to quit smoking.  You can cope with withdrawal symptoms by avoiding places where people smoke, avoiding drinks with caffeine, and getting plenty of rest.  Ask your health care provider about the different ways to prevent weight gain, avoid stress, and handle social situations. This information is not intended to replace advice given to you by your health care provider. Make sure you discuss any questions you have with your health care provider. Document Released: 06/05/2016 Document Revised: 06/05/2016 Document Reviewed:  06/05/2016 Elsevier Interactive Patient Education  [employer]2018 [employer]

## 2018-02-17 LAB — HEMOGLOBIN A1C
ESTIMATED AVERAGE GLUCOSE: 137 mg/dL
Hgb A1c MFr Bld: 6.4 % — ABNORMAL HIGH (ref 4.8–5.6)

## 2018-02-17 LAB — TSH: TSH: 1.03 u[IU]/mL (ref 0.450–4.500)

## 2018-06-18 ENCOUNTER — Emergency Department (HOSPITAL_COMMUNITY): Payer: Medicare Other

## 2018-06-18 ENCOUNTER — Encounter (HOSPITAL_COMMUNITY): Payer: Self-pay

## 2018-06-18 ENCOUNTER — Emergency Department (HOSPITAL_COMMUNITY)
Admission: EM | Admit: 2018-06-18 | Discharge: 2018-06-18 | Disposition: A | Discharge status: Home / Self Care | Payer: Medicare Other | Attending: Emergency Medicine | Admitting: Emergency Medicine

## 2018-06-18 DIAGNOSIS — Y999 Unspecified external cause status: Secondary | ICD-10-CM | POA: Diagnosis not present

## 2018-06-18 DIAGNOSIS — F17210 Nicotine dependence, cigarettes, uncomplicated: Secondary | ICD-10-CM | POA: Insufficient documentation

## 2018-06-18 DIAGNOSIS — Y92009 Unspecified place in unspecified non-institutional (private) residence as the place of occurrence of the external cause: Secondary | ICD-10-CM | POA: Insufficient documentation

## 2018-06-18 DIAGNOSIS — S329XXA Fracture of unspecified parts of lumbosacral spine and pelvis, initial encounter for closed fracture: Secondary | ICD-10-CM | POA: Diagnosis not present

## 2018-06-18 DIAGNOSIS — Z7982 Long term (current) use of aspirin: Secondary | ICD-10-CM | POA: Insufficient documentation

## 2018-06-18 DIAGNOSIS — W19XXXA Unspecified fall, initial encounter: Secondary | ICD-10-CM

## 2018-06-18 DIAGNOSIS — W0110XA Fall on same level from slipping, tripping and stumbling with subsequent striking against unspecified object, initial encounter: Secondary | ICD-10-CM | POA: Diagnosis not present

## 2018-06-18 DIAGNOSIS — J449 Chronic obstructive pulmonary disease, unspecified: Secondary | ICD-10-CM | POA: Insufficient documentation

## 2018-06-18 DIAGNOSIS — Z79899 Other long term (current) drug therapy: Secondary | ICD-10-CM | POA: Insufficient documentation

## 2018-06-18 DIAGNOSIS — S79912A Unspecified injury of left hip, initial encounter: Secondary | ICD-10-CM | POA: Diagnosis present

## 2018-06-18 DIAGNOSIS — I10 Essential (primary) hypertension: Secondary | ICD-10-CM | POA: Insufficient documentation

## 2018-06-18 DIAGNOSIS — Y939 Activity, unspecified: Secondary | ICD-10-CM | POA: Diagnosis not present

## 2018-06-18 MED ORDER — HYDROCODONE-ACETAMINOPHEN 5-325 MG PO TABS: 1.0000 | ORAL_TABLET | Freq: Four times a day (QID) | ORAL | 0 refills | Status: DC | PRN

## 2018-06-18 MED ORDER — IBUPROFEN 800 MG PO TABS
800.0000 mg | ORAL_TABLET | Freq: Once | ORAL | Status: AC
Start: 2018-06-18 — End: 2018-06-18
  Administered 2018-06-18: 800 mg via ORAL
  Filled 2018-06-18: qty 1

## 2018-06-18 MED ORDER — IBUPROFEN 600 MG PO TABS: 600.0000 mg | ORAL_TABLET | Freq: Four times a day (QID) | ORAL | 0 refills | Status: DC | PRN

## 2018-06-18 NOTE — ED Provider Notes (Signed)
[facility] Provider Note   CSN: [id number] Arrival date & time: 06/18/18  1546     History   Chief Complaint Chief Complaint  Patient presents with  . Hip Pain  . Fall    HPI Molly [name] is a 62 y.o. female.  The history is provided by the patient. No language interpreter was used.  Hip Pain   Fall      62 year old female presenting for evaluation of a prior fall.  Patient reports 2 days ago she was in the process of putting away her Christmas tree.  As she was moving a couch and pick up a Christmas tree, she slipped, fell backward and struck her left hip pelvic to the ground.  She denies hitting her head or loss of consciousness.  She report acute onset of sharp throbbing pain primarily to her left groin region.  Pain is worsening with movement and radiates towards her left thigh.  Pain is 10 out of 10.  No specific treatment tried at home.  She denies any bowel bladder incontinence or saddle anesthesia.  She denies any numbness or weakness.  She is having difficulty ambulating secondary to the pain.  She does not complain of any significant low back pain or knee pain.  Past Medical History:  Diagnosis Date  . Anxiety   . COPD (chronic obstructive pulmonary disease) (HCC)   . Heart murmur    dx at age 89  . Hypertension     Patient Active Problem List   Diagnosis Date Noted  . Gastroesophageal reflux disease   . COPD mixed type (HCC)   . Hypertension 05/14/2017  . Depression with anxiety 05/14/2017  . Multifocal pneumonia 05/14/2017  . Current smoker 05/14/2017    Past Surgical History:  Procedure Laterality Date  . NECK SURGERY       OB History   No obstetric history on file.      Home Medications    Prior to Admission medications   Medication Sig Start Date End Date Taking? Authorizing Provider  albuterol (PROVENTIL HFA;VENTOLIN HFA) 108 (90 Base) MCG/ACT inhaler Inhale 2 puffs into the lungs every 6 (six)  hours as needed for wheezing or shortness of breath. 05/15/17   Vassie LollMadera, Carlos, MD  aspirin EC 81 MG tablet Take 81 mg by mouth daily.    [provider]  atorvastatin (LIPITOR) 40 MG tablet Take 40 mg by mouth daily.    [provider]  busPIRone (BUSPAR) 15 MG tablet Take 15 mg by mouth 2 (two) times daily.    [provider]  clonazePAM (KLONOPIN) 0.5 MG tablet Take 0.5 mg by mouth 2 (two) times daily as needed for anxiety.    [provider]  cyclobenzaprine (FLEXERIL) 10 MG tablet Take 10 mg by mouth 3 (three) times daily as needed for muscle spasms.    [provider]  FLUoxetine (PROZAC) 20 MG capsule Take 60 mg by mouth daily.    [provider]  guaiFENesin-dextromethorphan (ROBITUSSIN DM) 100-10 MG/5ML syrup Take 10 mLs by mouth every 6 (six) hours as needed for cough (chest congestion). 05/15/17   Vassie LollMadera, Carlos, MD  Ibuprofen-Diphenhydramine Cit (IBUPROFEN PM) 200-38 MG TABS Take 2 tablets by mouth as needed (for pain/sleep).    [provider]  telmisartan (MICARDIS) 40 MG tablet Take 40 mg by mouth daily. 12/07/17   [provider]  tiotropium (SPIRIVA HANDIHALER) 18 MCG inhalation capsule Place 1 capsule (18 mcg total) into  inhaler and inhale daily. 05/15/17 05/15/18  Vassie LollMadera, Carlos, MD  zolpidem (AMBIEN) 10 MG tablet Take 10 mg by mouth at bedtime as needed for sleep.    [provider]    Family History Family History  Problem Relation Age of Onset  . Heart failure Father   . Sudden Cardiac Death Neg Hx     Social History Social History   Tobacco Use  . Smoking status: Current Every Day Smoker    Packs/day: 0.50    Years: 50.00    Pack years: 25.00    Types: Cigarettes  . Smokeless tobacco: Never Used  Substance Use Topics  . Alcohol use: Not Currently  . Drug use: No     Allergies   Penicillins   Review of Systems Review of Systems  Constitutional: Negative for fever.    Musculoskeletal: Positive for arthralgias.  Neurological: Negative for numbness.     Physical Exam Updated Vital Signs BP 129/76 (BP Location: Right Arm)   Pulse (!) 108   Temp 98 F (36.7 C) (Oral)   Resp 16   SpO2 94%   Physical Exam Vitals signs and nursing note reviewed.  Constitutional:      General: She is not in acute distress.    Appearance: She is well-developed.  HENT:     Head: Atraumatic.  Eyes:     Conjunctiva/sclera: Conjunctivae normal.  Neck:     Musculoskeletal: Neck supple.  Abdominal:     Palpations: Abdomen is soft.     Tenderness: There is no abdominal tenderness.  Musculoskeletal:        General: Tenderness (Tenderness to palpation of the left inguinal region without any obvious bruising noted.  Hip with full range of motion which includes abduction abduction flexion and extension.  No significant midline spine tenderness crepitus or step-off.  ) present.     Comments: The pelvis is stable.  Skin:    Findings: No rash.  Neurological:     Mental Status: She is alert.      ED Treatments / Results  Labs (all labs ordered are listed, but only abnormal results are displayed) Labs Reviewed - No data to display  EKG None  Radiology Dg Pelvis 1-2 Views  Result Date: 06/18/2018 CLINICAL DATA:  Patient fell 3 days ago and presents with left hip pain. EXAM: PELVIS - 1-2 VIEW COMPARISON:  None. FINDINGS: Acute left inferior and likely superior pubic rami fractures are identified. Osteoarthritic joint space narrowing of both hips. The remainder of the bony pelvis appears intact. There is lower lumbar degenerative disc and facet arthropathy from L4 through S1. No pelvic diastasis. IMPRESSION: 1. Acute left inferior and likely superior pubic rami fractures. 2. Lumbar spondylosis. 3. Mild osteoarthritic joint space narrowing of the hips. No proximal femoral fracture. Electronically Signed   By: Tollie Ethavid  Kwon M.D.   On: 06/18/2018 18:30   Dg Femur Min 2 Views  Left  Result Date: 06/18/2018 CLINICAL DATA:  Severe hip pain secondary to a fall 3 days ago. EXAM: LEFT FEMUR 2 VIEWS COMPARISON:  None. FINDINGS: There are minimally displaced fractures of the left inferior and superior pubic rami. The left hip is intact. Left femur is intact. There is suggestion of a left sacral ala fracture. This is not definitive. IMPRESSION: Fractures of the left inferior and superior pubic rami. Possible left sacral ala fracture. Intact femur. Electronically Signed   By: Francene BoyersJames  Maxwell M.D.   On: 06/18/2018 18:30    Procedures Procedures (including  critical care time)  Medications Ordered in ED Medications  ibuprofen (ADVIL,MOTRIN) tablet 800 mg (800 mg Oral Given 06/18/18 1728)     Initial Impression / Assessment and Plan / ED Course  I have reviewed the triage vital signs and the nursing notes.  Pertinent labs & imaging results that were available during my care of the patient were reviewed by me and considered in my medical decision making (see chart for details).     BP 118/69 (BP Location: Right Arm)   Pulse 97   Temp 98 F (36.7 C) (Oral)   Resp 16   SpO2 92%    Final Clinical Impressions(s) / ED Diagnoses   Final diagnoses:  Closed fracture of left pelvis, initial encounter [facility])    ED Discharge Orders         Ordered    ibuprofen (ADVIL,MOTRIN) 600 MG tablet  Every 6 hours PRN     06/18/18 1931    HYDROcodone-acetaminophen (NORCO/VICODIN) 5-325 MG tablet  Every 6 hours PRN     06/18/18 1931         5:23 PM Patient had a mechanical fall and now she is having pain primarily to her left groin and left thigh.  I suspect this is likely a musculoskeletal strain, x-ray ordered to rule out occult fracture.  Pain medication given.  She is neurovascular intact.  7:33 PM X-ray demonstrate a fracture of the left inferior and superior pubic rami and possible left sacral ala fracture.  Intact femur.  I discussed the finding with patient.  Patient is  weightbearing and able to use a walker.  Recommend weightbearing as tolerated, pain medication prescribed, orthopedic referral given as needed.  Return precautions discussed.  Care discussed with Dr. Anitra Lauth.   Fayrene Helper, PA-C 06/18/18 1934    Gwyneth Sprout, MD 06/19/18 1308

## 2018-06-18 NOTE — Discharge Instructions (Signed)
You have been diagnosed with pelvic fracture.  Please bear weight as tolerated.  Use walker to help with ambulation.  Take pain medication as prescribed.  Follow up with orthopedist for further care.

## 2018-06-18 NOTE — ED Triage Notes (Signed)
Pt endorses falling 2 days ago while taking her tree down, landed on her left side and has had left hip and left upper leg pain since. VSS.

## 2019-05-05 ENCOUNTER — Ambulatory Visit: Payer: Medicare Other | Admitting: Cardiovascular Disease

## 2019-07-04 ENCOUNTER — Ambulatory Visit: Payer: Medicare Other | Admitting: Cardiovascular Disease

## 2019-12-18 ENCOUNTER — Ambulatory Visit: Payer: Medicare Other | Admitting: Cardiovascular Disease

## 2019-12-19 ENCOUNTER — Telehealth (INDEPENDENT_AMBULATORY_CARE_PROVIDER_SITE_OTHER): Payer: Medicare Other | Admitting: Cardiovascular Disease

## 2019-12-19 ENCOUNTER — Encounter: Payer: Self-pay | Admitting: Cardiovascular Disease

## 2019-12-19 ENCOUNTER — Telehealth: Payer: Self-pay

## 2019-12-19 VITALS — BP 127/90 | HR 112 | Ht 63.0 in | Wt 155.0 lb

## 2019-12-19 DIAGNOSIS — F419 Anxiety disorder, unspecified: Secondary | ICD-10-CM

## 2019-12-19 DIAGNOSIS — Z720 Tobacco use: Secondary | ICD-10-CM

## 2019-12-19 DIAGNOSIS — E785 Hyperlipidemia, unspecified: Secondary | ICD-10-CM

## 2019-12-19 DIAGNOSIS — I10 Essential (primary) hypertension: Secondary | ICD-10-CM

## 2019-12-19 DIAGNOSIS — R002 Palpitations: Secondary | ICD-10-CM

## 2019-12-19 DIAGNOSIS — R079 Chest pain, unspecified: Secondary | ICD-10-CM

## 2019-12-19 DIAGNOSIS — F32A Depression, unspecified: Secondary | ICD-10-CM

## 2019-12-19 DIAGNOSIS — F329 Major depressive disorder, single episode, unspecified: Secondary | ICD-10-CM

## 2019-12-19 DIAGNOSIS — J449 Chronic obstructive pulmonary disease, unspecified: Secondary | ICD-10-CM

## 2019-12-19 NOTE — Patient Instructions (Signed)

## 2019-12-19 NOTE — Telephone Encounter (Signed)
  Patient Consent for Virtual Visit         Molly [name] has provided verbal consent on 12/19/2019 for a virtual visit (video or telephone).   CONSENT FOR VIRTUAL VISIT FOR:  Molly [name]  By participating in this virtual visit I agree to the following:  I hereby voluntarily request, consent and authorize [facility] and its employed or contracted physicians, Producer, television/film/video, nurse practitioners or other licensed health care professionals (the Practitioner), to provide me with telemedicine health care services (the "Services") as deemed necessary by the treating Practitioner. I acknowledge and consent to receive the Services by the Practitioner via telemedicine. I understand that the telemedicine visit will involve communicating with the Practitioner through live audiovisual communication technology and the disclosure of certain medical information by electronic transmission. I acknowledge that I have been given the opportunity to request an in-person assessment or other available alternative prior to the telemedicine visit and am voluntarily participating in the telemedicine visit.  I understand that I have the right to withhold or withdraw my consent to the use of telemedicine in the course of my care at any time, without affecting my right to future care or treatment, and that the Practitioner or I may terminate the telemedicine visit at any time. I understand that I have the right to inspect all information obtained and/or recorded in the course of the telemedicine visit and may receive copies of available information for a reasonable fee.  I understand that some of the potential risks of receiving the Services via telemedicine include:  Marland Kitchen Delay or interruption in medical evaluation due to technological equipment failure or disruption; . Information transmitted may not be sufficient (e.g. poor resolution of images) to allow for appropriate medical decision making by the  Practitioner; and/or  . In rare instances, security protocols could fail, causing a breach of personal health information.  Furthermore, I acknowledge that it is my responsibility to provide information about my medical history, conditions and care that is complete and accurate to the best of my ability. I acknowledge that Practitioner's advice, recommendations, and/or decision may be based on factors not within their control, such as incomplete or inaccurate data provided by me or distortions of diagnostic images or specimens that may result from electronic transmissions. I understand that the practice of medicine is not an exact science and that Practitioner makes no warranties or guarantees regarding treatment outcomes. I acknowledge that a copy of this consent can be made available to me via my patient portal [facility] MyChart), or I can request a printed copy by calling the office of [facility].    I understand that my insurance will be billed for this visit.   I have read or had this consent read to me. . I understand the contents of this consent, which adequately explains the benefits and risks of the Services being provided via telemedicine.  . I have been provided ample opportunity to ask questions regarding this consent and the Services and have had my questions answered to my satisfaction. . I give my informed consent for the services to be provided through the use of telemedicine in my medical care

## 2019-12-19 NOTE — Progress Notes (Signed)
Virtual Visit via Telephone Note   This visit type was conducted due to national recommendations for restrictions regarding the COVID-19 Pandemic (e.g. social distancing) in an effort to limit this patient's exposure and mitigate transmission in our community.  Due to her co-morbid illnesses, this patient is at least at moderate risk for complications without adequate follow up.  This format is felt to be most appropriate for this patient at this time.  The patient did not have access to video technology/had technical difficulties with video requiring transitioning to audio format only (telephone).  All issues noted in this document were discussed and addressed.  No physical exam could be performed with this format.  Please refer to the patient's chart for her  consent to telehealth for [facility].   The patient was identified using 2 identifiers.  Date:  12/19/2019   ID:  Molly [name], DOB Sep 23, 1955, MRN [id number]  Patient Location: Home Provider Location: Office  PCP:  Clovis Riley, Elbert Ewings.August Saucer, MD  Cardiologist:  Nicki Guadalajara, MD Electrophysiologist:  None   Evaluation Performed: Initial office evaluation with me  Chief Complaint: Hypertension, diabetes mellitus, hyperlipidemia, COPD  History of Present Illness:    Molly [name] is a 64 y.o. female who have never seen before.  However, the patient has been evaluated by Darleen Crocker in 2019.  She has a history of hypertension, diabetes mellitus, hyperlipidemia, ongoing tobacco use, COPD, anxiety/depression, as well as GERD.  She was initially seen by Theodore Demark on December 15, 2017 with complaints of chest pain.  She also had complaints of some palpitations, nausea, diaphoresis and diaphoresis.  She was referred to undergo a nuclear stress test which was done on December 29, 2017 which revealed normal perfusion and function.  The study was low risk.  EF was 75% on the nuclear study.  She subsequently wore a monitor from July 3 to January 20, 2018 with an average heart rate of 81 bpm.  She was in sinus rhythm.  There were periods of sinus tachycardia.  There were no episodes of atrial fibrillation or significant ectopy.  Over the past several years, she is followed by Dr. Lupe Carney.  She continues to smoke cigarettes and currently states that she smokes 5 cigarettes/day.  She does not exercise.  She typically takes naps in the afternoon.  She retired approximately 10 years ago on disability.  She presents for initial evaluation with me.  The patient does not have symptoms concerning for COVID-19 infection (fever, chills, cough, or new shortness of breath).    Past Medical History:  Diagnosis Date  . Anxiety   . COPD (chronic obstructive pulmonary disease) (HCC)   . Heart murmur    dx at age 1  . Hypertension    Past Surgical History:  Procedure Laterality Date  . NECK SURGERY       Current Meds  Medication Sig  . albuterol (PROVENTIL HFA;VENTOLIN HFA) 108 (90 Base) MCG/ACT inhaler Inhale 2 puffs into the lungs every 6 (six) hours as needed for wheezing or shortness of breath.  Marland Kitchen aspirin EC 81 MG tablet Take 81 mg by mouth daily.  Marland Kitchen atorvastatin (LIPITOR) 40 MG tablet Take 40 mg by mouth daily.  . busPIRone (BUSPAR) 15 MG tablet Take 15 mg by mouth 2 (two) times daily.  . clonazePAM (KLONOPIN) 0.5 MG tablet Take 0.5 mg by mouth 2 (two) times daily as needed for anxiety.  . cyclobenzaprine (FLEXERIL) 10 MG tablet Take 10 mg by mouth  3 (three) times daily as needed for muscle spasms.  Marland Kitchen FLUoxetine (PROZAC) 20 MG capsule Take 60 mg by mouth daily.  Marland Kitchen HYDROcodone-acetaminophen (NORCO/VICODIN) 5-325 MG tablet Take 1 tablet by mouth every 6 (six) hours as needed for moderate pain.  Marland Kitchen telmisartan (MICARDIS) 40 MG tablet Take 40 mg by mouth daily.  Marland Kitchen zolpidem (AMBIEN) 10 MG tablet Take 10 mg by mouth at bedtime as needed for sleep.  . [DISCONTINUED] guaiFENesin-dextromethorphan (ROBITUSSIN DM) 100-10 MG/5ML syrup Take 10 mLs  by mouth every 6 (six) hours as needed for cough (chest congestion).  . [DISCONTINUED] ibuprofen (ADVIL,MOTRIN) 600 MG tablet Take 1 tablet (600 mg total) by mouth every 6 (six) hours as needed.  . [DISCONTINUED] Ibuprofen-Diphenhydramine Cit (IBUPROFEN PM) 200-38 MG TABS Take 2 tablets by mouth as needed (for pain/sleep).     Allergies:   Penicillins   Social History   Tobacco Use  . Smoking status: Current Every Day Smoker    Packs/day: 0.50    Years: 50.00    Pack years: 25.00    Types: Cigarettes  . Smokeless tobacco: Never Used  Substance Use Topics  . Alcohol use: Not Currently  . Drug use: No     Family Hx: The patient's family history includes Heart failure in her father. There is no history of Sudden Cardiac Death.  ROS:   Please see the history of present illness.    No fevers chills night sweats No cough Positive for tobacco use Positive for diabetes mellitus Positive for hypertension Positive for hyperlipidemia Positive for GERD Positive for daytime sleepiness Positive for anxiety depression All other systems reviewed and are negative.   Prior CV studies:   The following studies were reviewed today:  Nuclear Study Highlights   The left ventricular ejection fraction is hyperdynamic (>65%).  Nuclear stress EF: 75%.  There was no ST segment deviation noted during stress.  The study is normal.  This is a low risk study.    Monitor: Patient was monitored from July 3 through January 20, 2018.  The average heart rate was 91 bpm.  Frequent artifact was present.  The patient was in sinus rhythm.  She was in sinus tachycardia 18% of readable data.  There was no significant ectopy.  There were no pauses.  There were no episodes of atrial fibrillation or atrial flutter.   Labs/Other Tests and Data Reviewed:    EKG:  An ECG dated 12/15/2017 was personally reviewed today and demonstrated:  Normal sinus rhythm at 95 bpm.  Q wave in lead III.  Recent Labs: No  results found for requested labs within last 8760 hours.   Recent Lipid Panel No results found for: CHOL, TRIG, HDL, CHOLHDL, LDLCALC, LDLDIRECT  Wt Readings from Last 3 Encounters:  12/19/19 155 lb (70.3 kg)  02/16/18 159 lb 6.4 oz (72.3 kg)  12/29/17 164 lb (74.4 kg)     Objective:    Vital Signs:  BP 127/90 (BP Location: Left Arm, Patient Position: Sitting, Cuff Size: Normal)   Pulse (!) 112   Ht 5\' 3"  (1.6 m)   Wt 155 lb (70.3 kg)   BMI 27.46 kg/m    Since this was a virtual visit I could not physically examine the patient. Breathing was normal and not labored No audible wheezing Fatigue No chest pain No abdominal pain No swelling   ASSESSMENT & PLAN:    1. Essential hypertension: Her blood pressure today was 127/90.  It was uncertain if this was her  consistent diastolic blood pressure.  She continues to be on telmisartan 40 mg daily.  She has significant anxiety which increases her heart rate.  She may require initiation of beta-blocker therapy if when seen in person she continues to have tachycardia. 2. Atypical chest pain: I reviewed her nuclear perfusion study from 2009.  There is no evidence for scar or ischemia.  EF is hyperdynamic following stress. 3. Hyperlipidemia: Currently on atorvastatin 40 mg.  In July 2020 LDL cholesterol was 61. 4. Type 2 diabetes mellitus.  Hemoglobin A1c on July 11, 2019 was 7.9.  She states she was started on metformin 500 mg Dr. Lupe Carney. 5. COPD: She continues to smoke cigarettes.  She is on Spiriva daily. 6. Anxiety/depression.  She is on multiple medications including buspirone in addition to Prozac and Klonopin.  COVID-19 Education: The signs and symptoms of COVID-19 were discussed with the patient and how to seek care for testing (follow up with PCP or arrange E-visit).  The importance of social distancing was discussed today.  Time:   Today, I have spent 21 minutes with the patient with telehealth technology discussing  the above problems.     Medication Adjustments/Labs and Tests Ordered: Current medicines are reviewed at length with the patient today.  Concerns regarding medicines are outlined above.   Tests Ordered: No orders of the defined types were placed in this encounter.   Medication Changes: No orders of the defined types were placed in this encounter.   Follow Up: Patient was advised to follow-up with continued over-the-counter blood pressure and heart rate.  I have recommended that I see her in 1 year for an in office evaluation  Signed, Nicki Guadalajara, MD  12/19/2019 9:00 AM    [facility]

## 2019-12-20 ENCOUNTER — Encounter: Payer: Self-pay | Admitting: Cardiovascular Disease

## 2020-04-24 IMAGING — DX DG CHEST 2V
2 series · 2 of 2 positions shown · non-contrast
Comparison: 05/14/2017.

CLINICAL DATA: Chest pain for several days.  Smoker.

EXAM:
CHEST - 2 VIEW

[dg chest 2 view (1 of 2)]
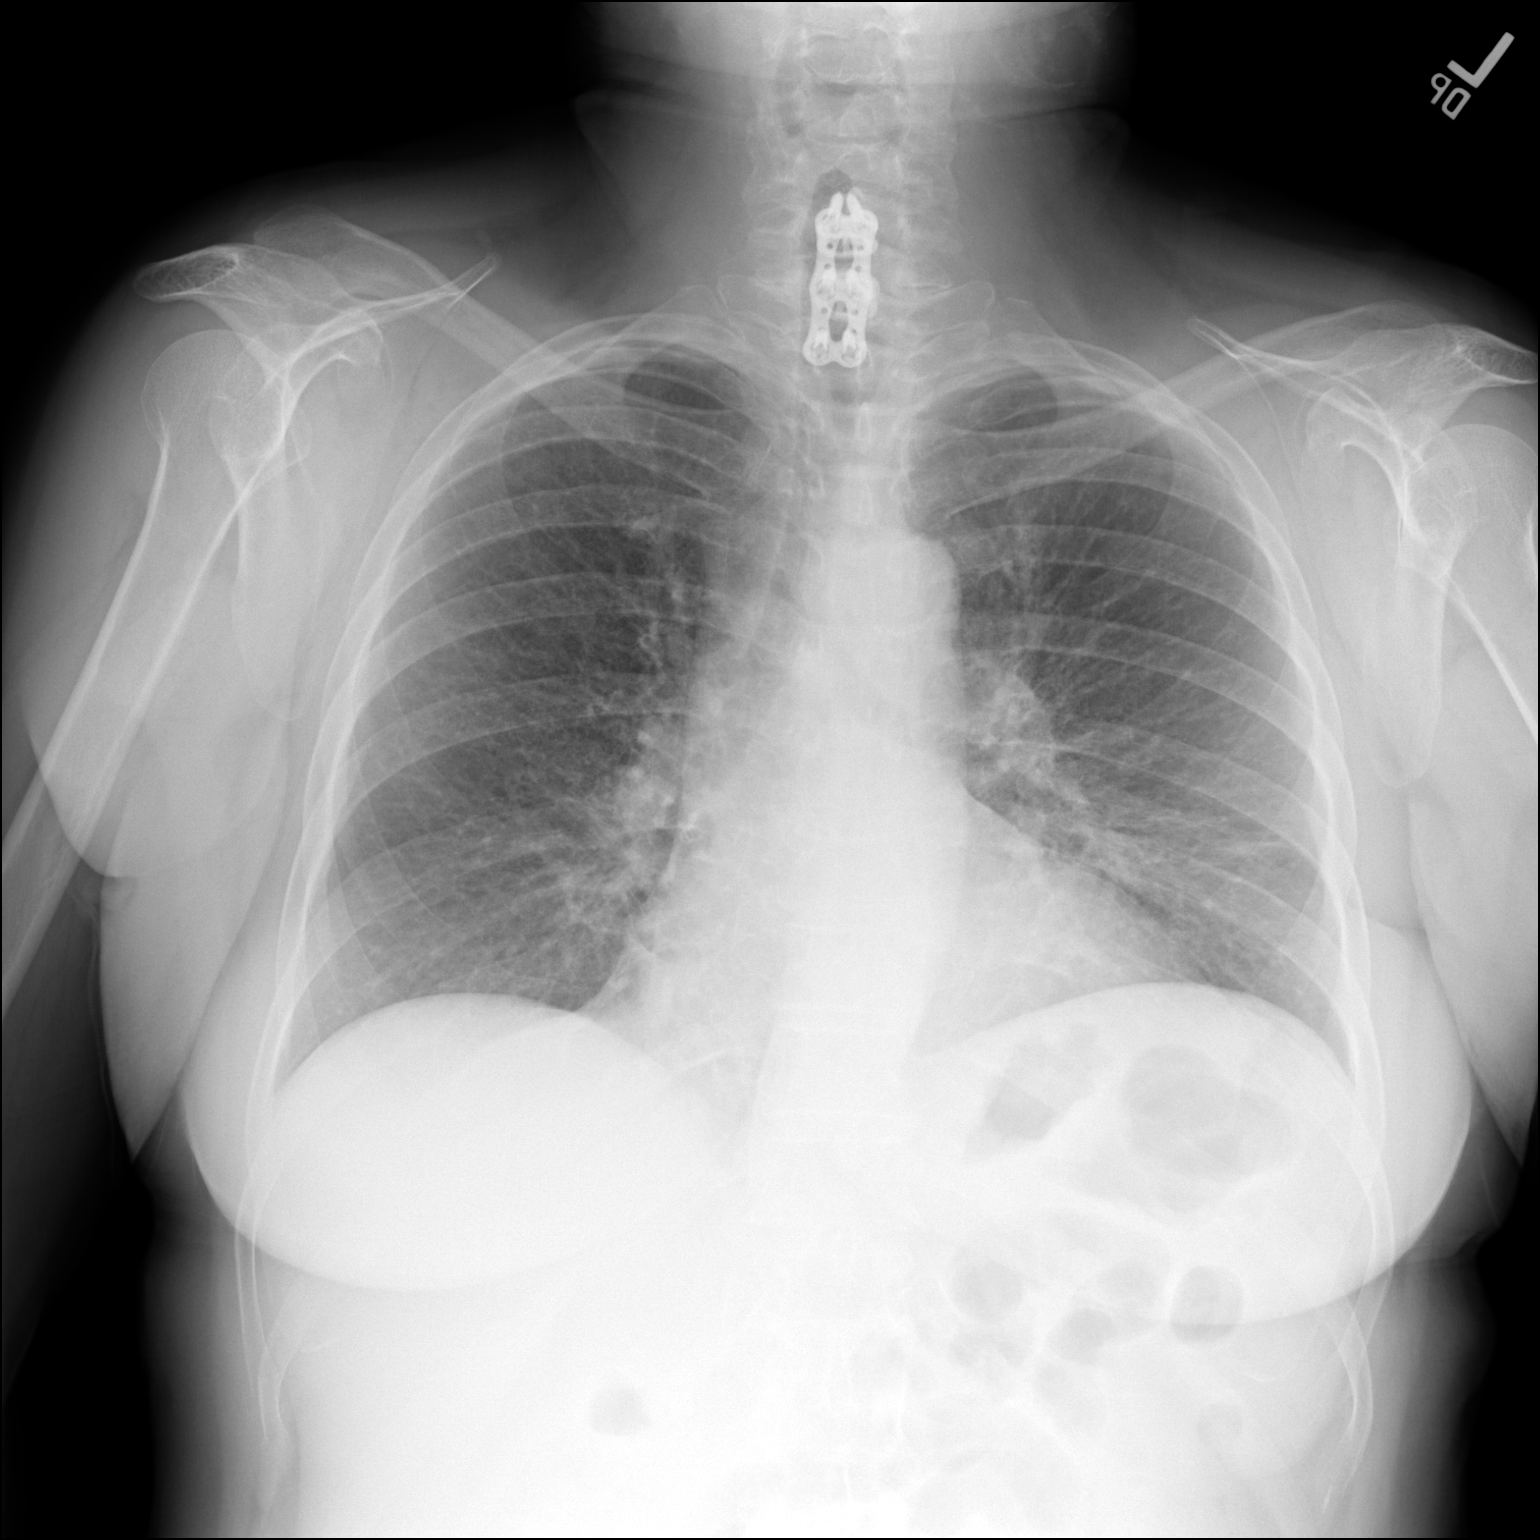

[dg chest 2 view (2 of 2)]
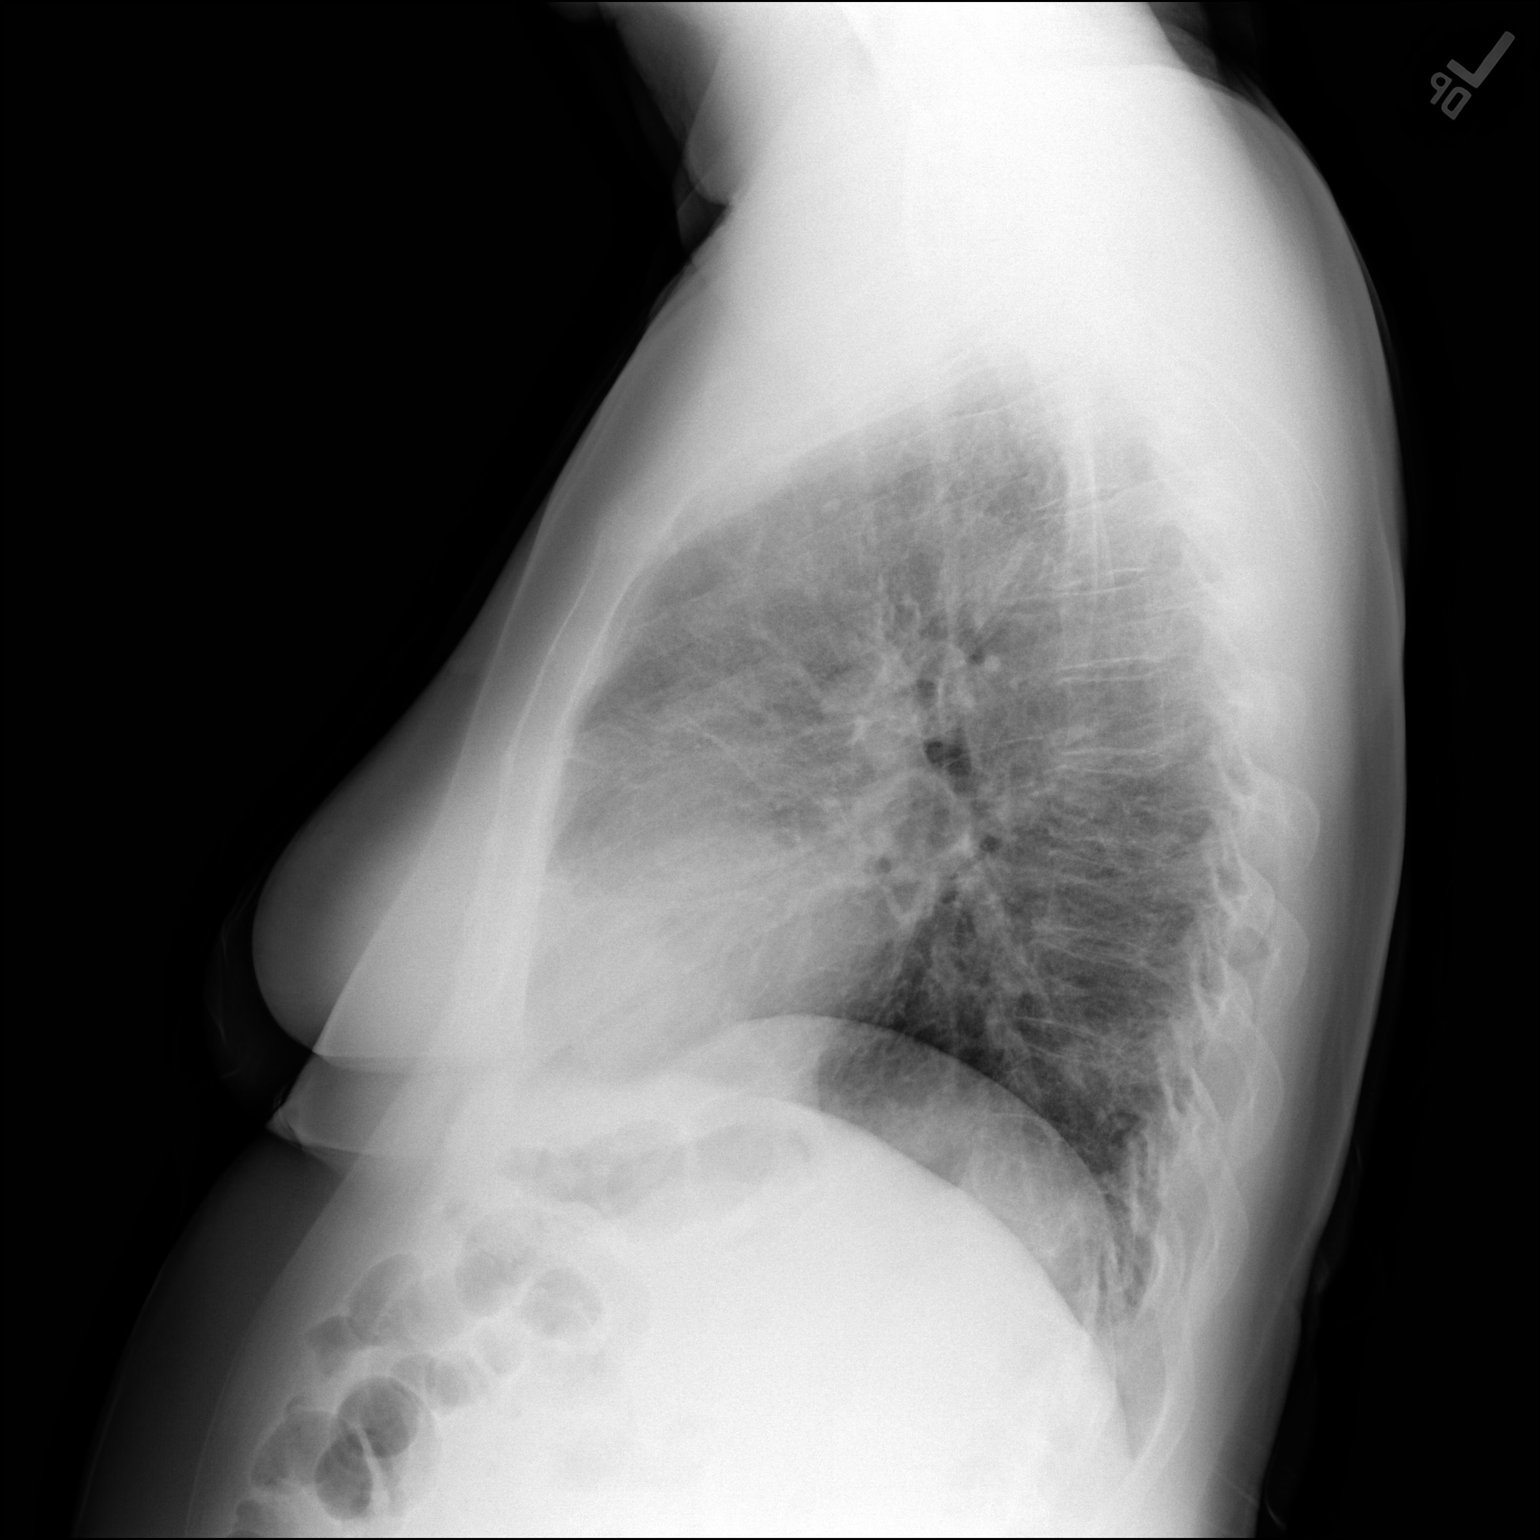

[2 of 2 positions shown; findings below may reference images not displayed]

FINDINGS: Normal sized heart. Mildly tortuous aorta. Decreased inspiration
with minimal patchy and linear density in the left lower lung zone.
Mild diffuse peribronchial thickening and accentuation of the
interstitial markings. Minimal thoracic spine degenerative changes.
Cervical spine fixation hardware.
IMPRESSION: 1. Poor inspiration with minimal atelectasis and possible pneumonia
in the left mid to lower lung zone.
2. Stable chronic bronchitic changes.

## 2020-07-29 DIAGNOSIS — G4700 Insomnia, unspecified: Secondary | ICD-10-CM | POA: Diagnosis not present

## 2020-07-29 DIAGNOSIS — E7800 Pure hypercholesterolemia, unspecified: Secondary | ICD-10-CM | POA: Diagnosis not present

## 2020-07-29 DIAGNOSIS — I10 Essential (primary) hypertension: Secondary | ICD-10-CM | POA: Diagnosis not present

## 2020-07-29 DIAGNOSIS — J449 Chronic obstructive pulmonary disease, unspecified: Secondary | ICD-10-CM | POA: Diagnosis not present

## 2020-07-29 DIAGNOSIS — R1310 Dysphagia, unspecified: Secondary | ICD-10-CM | POA: Diagnosis not present

## 2020-07-29 DIAGNOSIS — R5383 Other fatigue: Secondary | ICD-10-CM | POA: Diagnosis not present

## 2020-07-29 DIAGNOSIS — E1169 Type 2 diabetes mellitus with other specified complication: Secondary | ICD-10-CM | POA: Diagnosis not present

## 2020-08-07 DIAGNOSIS — M549 Dorsalgia, unspecified: Secondary | ICD-10-CM | POA: Diagnosis not present

## 2021-01-17 DIAGNOSIS — M778 Other enthesopathies, not elsewhere classified: Secondary | ICD-10-CM | POA: Diagnosis not present

## 2021-01-24 ENCOUNTER — Ambulatory Visit: Payer: Medicare Other | Admitting: Cardiovascular Disease

## 2021-02-06 DIAGNOSIS — R1310 Dysphagia, unspecified: Secondary | ICD-10-CM | POA: Diagnosis not present

## 2021-02-06 DIAGNOSIS — R2689 Other abnormalities of gait and mobility: Secondary | ICD-10-CM | POA: Diagnosis not present

## 2021-02-06 DIAGNOSIS — E7800 Pure hypercholesterolemia, unspecified: Secondary | ICD-10-CM | POA: Diagnosis not present

## 2021-02-06 DIAGNOSIS — Z1211 Encounter for screening for malignant neoplasm of colon: Secondary | ICD-10-CM | POA: Diagnosis not present

## 2021-02-06 DIAGNOSIS — I10 Essential (primary) hypertension: Secondary | ICD-10-CM | POA: Diagnosis not present

## 2021-02-06 DIAGNOSIS — E1169 Type 2 diabetes mellitus with other specified complication: Secondary | ICD-10-CM | POA: Diagnosis not present

## 2021-02-06 DIAGNOSIS — J449 Chronic obstructive pulmonary disease, unspecified: Secondary | ICD-10-CM | POA: Diagnosis not present

## 2021-02-06 DIAGNOSIS — M5459 Other low back pain: Secondary | ICD-10-CM | POA: Diagnosis not present

## 2021-02-06 DIAGNOSIS — M810 Age-related osteoporosis without current pathological fracture: Secondary | ICD-10-CM | POA: Diagnosis not present

## 2021-02-06 DIAGNOSIS — Z0000 Encounter for general adult medical examination without abnormal findings: Secondary | ICD-10-CM | POA: Diagnosis not present

## 2021-02-10 DIAGNOSIS — Z1211 Encounter for screening for malignant neoplasm of colon: Secondary | ICD-10-CM | POA: Diagnosis not present

## 2021-02-18 ENCOUNTER — Other Ambulatory Visit: Payer: Self-pay

## 2021-02-18 ENCOUNTER — Ambulatory Visit: Payer: Medicare Other | Attending: Family Medicine

## 2021-02-18 DIAGNOSIS — R262 Difficulty in walking, not elsewhere classified: Secondary | ICD-10-CM | POA: Diagnosis not present

## 2021-02-18 DIAGNOSIS — R2689 Other abnormalities of gait and mobility: Secondary | ICD-10-CM | POA: Diagnosis not present

## 2021-02-18 DIAGNOSIS — R278 Other lack of coordination: Secondary | ICD-10-CM | POA: Diagnosis not present

## 2021-02-18 DIAGNOSIS — M6281 Muscle weakness (generalized): Secondary | ICD-10-CM | POA: Diagnosis not present

## 2021-02-19 NOTE — Therapy (Addendum)
[facility] Outpatient Rehabilitation [facility] [address] [place], [place], [address] Phone: [phone]   Fax:  [phone]  Physical Therapy Evaluation  Patient Details  Name: Molly [name] MRN: [id number] Date of Birth: 06/09/1956 Referring Provider (PT): Clovis Riley, L.August Saucer, MD   Encounter Date: 02/18/2021   PT End of Session - 02/19/21 6834     Visit Number 1    Number of Visits 17    Date for PT Re-Evaluation 04/26/21    Authorization Type Primary-UHC MEDICARE, Secondary-MEDICAID [place] ACCESS    Progress Note Due on Visit 10    PT Start Time 0930    PT Stop Time 1030    PT Time Calculation (min) 60 min    Activity Tolerance Patient tolerated treatment well    Behavior During Therapy WFL for tasks assessed/performed;Anxious             Past Medical History:  Diagnosis Date   Anxiety    COPD (chronic obstructive pulmonary disease) (HCC)    Heart murmur    dx at age 62   Hypertension     Past Surgical History:  Procedure Laterality Date   NECK SURGERY      There were no vitals filed for this visit.    Subjective Assessment - 02/18/21 0947     Subjective Pt reports she has been having issues with balance and falling for a while. Pt states she does not leave home unassisted. At home pt is a furniture walker as needed. Pt notes she does not have and assist device for walking, grab bars in the shower or a shower chair.    Limitations Standing;Walking    Patient Stated Goals To get around better    Currently in Pain? Yes    Pain Score 3     Pain Location Back    Pain Orientation Right;Left;Posterior    Pain Descriptors / Indicators Aching    Pain Type Chronic pain    Pain Onset More than a month ago    Pain Frequency Intermittent                [facility] PT Assessment - 02/19/21 0001       Assessment   Medical Diagnosis Other abnormalities of gait and mobility    Referring Provider (PT) Clovis Riley, L.August Saucer, MD    Onset  Date/Surgical Date --   a chronic issue   Hand Dominance Right      Precautions   Precautions Fall      Restrictions   Weight Bearing Restrictions No      Balance Screen   Has the patient fallen in the past 6 months Yes    How many times? 3   Pt reports her last fall occured when squatting to get an item out of the refrigerator and lost her balance backwards   Has the patient had a decrease in activity level because of a fear of falling?  No    Is the patient reluctant to leave their home because of a fear of falling?  Yes      Home Environment   Living Environment Private residence    Living Arrangements Other relatives    Type of Home Mobile home    Home Access Stairs to enter    Entrance Stairs-Number of Steps 4    Entrance Stairs-Rails Can reach both    Home Layout One level    Home Equipment --   does not have an assistive device, grab bars or  a shower chair     Prior Function   Level of Independence Needs assistance with gait    Vocation On disability      Cognition   Overall Cognitive Status Within Functional Limits for tasks assessed      Observation/Other Assessments   Observations Dysarthria with speech      Sensation   Light Touch Appears Intact      Coordination   Finger Nose Finger Test Min to mod decreased cordination    Heel Shin Test Min to mod decreased cordination      Posture/Postural Control   Posture/Postural Control Postural limitations    Postural Limitations Forward head;Rounded Shoulders;Decreased lumbar lordosis      ROM / Strength   AROM / PROM / Strength AROM;Strength      AROM   Overall AROM Comments AROM for the UEs and LEs are [facility]      Strength   Overall Strength Comments UE strength bilat was grossly shoulder 4/5, elbows 4+/5, and grip 4/5    Strength Assessment Site Hip;Knee;Ankle    Right/Left Hip Right;Left    Right Hip Flexion 3-/5    Right Hip Extension 3-/5    Right Hip External Rotation  3+/5    Right Hip Internal  Rotation 3+/5    Right Hip ABduction 3+/5    Right Hip ADduction 4/5    Left Hip Flexion 3+/5    Left Hip Extension 3+/5    Left Hip External Rotation 4/5    Left Hip Internal Rotation 4/5    Left Hip ABduction 4/5    Left Hip ADduction 4/5    Right/Left Knee Right;Left    Right Knee Flexion 4+/5    Right Knee Extension 4+/5    Left Knee Flexion 4+/5    Left Knee Extension 4+/5    Right/Left Ankle Right;Left    Right Ankle Dorsiflexion 3+/5    Right Ankle Plantar Flexion 3/5    Left Ankle Dorsiflexion 3+/5    Left Ankle Plantar Flexion 3/5      Transfers   Transfers Sit to Stand;Stand to Sit    Sit to Stand 6: Modified independent (Device/Increase time)    Five time sit to stand comments  11.8      Ambulation/Gait   Ambulation/Gait Yes    Ambulation/Gait Assistance 4: Min guard    Ambulation Distance (Feet) 100 Feet    Gait Pattern Ataxic   irregular foot placement   Gait Comments Quick balance assessment: SLS- LOB immediately. DLS c eyes closed- LOB in 4 sec. Gait assessment and training with a SPC- Pt had difficulty with sequencing the use of the SPC and balance and safety were not improved with an initial trial. Use of a SPC may improved with continued training or pt may benefit from an AD providing greater balance support.                        Objective measurements completed on examination: See above findings.               PT Education - 02/19/21 0605     Education Details Eval findings, POC, HEP, recommendations for grab bars and shower chair/tub bench and for continued assist by family for ambualtion outside the home and for steps.    Person(s) Educated Patient              PT Short Term Goals - 02/19/21 1004  PT SHORT TERM GOAL #1   Title Pt will report uderstanding of safety equipment recommendation to decreased risk of falling    Status New    Target Date 03/01/21               PT Long Term Goals - 02/19/21 1005        PT LONG TERM GOAL #1   Title Pt will be Ind in a HEP to address cordination, strength, and ambualtion deficits    Status New    Target Date 04/26/21      PT LONG TERM GOAL #2   Title Increase bilat hip strength to 4/5 or >, knee strength to 5/5, and ankle strength to 4/5 or > for improved funcitonal mobility and safety    Status New    Target Date 04/26/21      PT LONG TERM GOAL #3   Title Pt's safety will improve to supervion or less for community ambulation of 363ft with or without and AD for greater safety with functional mobility    Baseline min guard 172ft without AD    Status New    Target Date 04/26/21      PT LONG TERM GOAL #4   Title Pt will report improved safety with mobility with her experiencing no falls over the course of PT.    Status New    Target Date 04/26/21                    Plan - 02/19/21 0604     Clinical Impression Statement Pt presents to PT with strength, balance, and cordination deficits with a long term Hx of gait abnormalities and falls. Pt's gait pattern s an AD is ataxic in nature with irregular foot placements. With a quick balance assessment: SLS- LOB immediately. DLS c eyes closed- LOB in 4 sec. Gait assessment and training with a SPC was completed- Pt had difficulty with sequencing the use of the [facility] and balance and safety were not improved with an initial trial. Use of a SPC may improved with continued training or pt may benefit from an AD providing greater balance support. Recommendations were made for continued assistance by family for ambulation outside the home and for steps, and grab bars and a shower chair/tub bench for improved safety in bathroom. Due to cordination/ataxia deficits the recoomendation was made of pt's PT to take palce at [facility]. Pt will benefit from PT 2w8 to address deficits and optimize functional mobility and safety.    Personal Factors and Comorbidities Age;Comorbidity 1;Comorbidity 2;Comorbidity  3+;Time since onset of injury/illness/exacerbation;Past/Current Experience    Comorbidities COPD, HTN, anxiety    Examination-Activity Limitations Locomotion Level;Squat;Stand;Stairs;Transfers    Stability/Clinical Decision Making Evolving/Moderate complexity    Clinical Decision Making Moderate    Rehab Potential Good    PT Frequency 2x / week    PT Duration 8 weeks    PT Treatment/Interventions ADLs/Self Care Home Management;Moist Heat;Gait training;Stair training;Functional mobility training;Therapeutic activities;Therapeutic exercise;Balance training;Neuromuscular re-education;Patient/family education;Manual techniques;Passive range of motion;Taping;Dry needling    PT Next Visit Plan Continue with specific balance assessment tools and set goals. Continue development of a HEP to address strength and balance deficits    PT Home Exercise Plan [email or web address]    Consulted and Agree with Plan of Care Patient             Patient will benefit from skilled therapeutic intervention in order to improve the following deficits and impairments:  Abnormal gait,  Difficulty walking, Obesity, Decreased activity tolerance, Pain, Decreased balance, Decreased strength, Postural dysfunction  Visit Diagnosis: Other abnormalities of gait and mobility  Difficulty in walking, not elsewhere classified  Muscle weakness (generalized)  Other lack of coordination  Balance disorder     Problem List Patient Active Problem List   Diagnosis Date Noted   Gastroesophageal reflux disease    COPD mixed type (HCC)    Hypertension 05/14/2017   Depression with anxiety 05/14/2017   Multifocal pneumonia 05/14/2017   Current smoker 05/14/2017    Joellyn Rued MS, PT 02/19/21 12:45 PM   [facility] Outpatient Rehabilitation [facility] [address] [place], [place], [address] Phone: [phone]   Fax:  [phone]  Name: Molly [name] MRN: [id number] Date of Birth: 02/23/1956

## 2021-02-26 ENCOUNTER — Ambulatory Visit: Payer: Medicare Other | Admitting: Physical Therapy

## 2021-03-12 ENCOUNTER — Other Ambulatory Visit: Payer: Self-pay

## 2021-03-12 ENCOUNTER — Ambulatory Visit: Payer: Medicare Other | Attending: Family Medicine

## 2021-03-12 DIAGNOSIS — R262 Difficulty in walking, not elsewhere classified: Secondary | ICD-10-CM | POA: Diagnosis not present

## 2021-03-12 DIAGNOSIS — R2689 Other abnormalities of gait and mobility: Secondary | ICD-10-CM | POA: Insufficient documentation

## 2021-03-12 DIAGNOSIS — M6281 Muscle weakness (generalized): Secondary | ICD-10-CM | POA: Diagnosis not present

## 2021-03-12 DIAGNOSIS — R278 Other lack of coordination: Secondary | ICD-10-CM | POA: Diagnosis not present

## 2021-03-12 NOTE — Therapy (Signed)
[facility] [address] [place], [place], [address] Phone: [phone]   Fax:  [phone]  Physical Therapy Treatment  Patient Details  Name: Molly [name] MRN: [id number] Date of Birth: March 08, 1956 Referring Provider (PT): Clovis Riley, L.August Saucer, MD   Encounter Date: 03/12/2021   PT End of Session - 03/12/21 0914     Visit Number 2    Number of Visits 17    Date for PT Re-Evaluation 04/26/21    Authorization Type Primary-UHC MEDICARE, Secondary-MEDICAID [place] ACCESS    Progress Note Due on Visit 10    PT Start Time 0910    PT Stop Time 0955    PT Time Calculation (min) 45 min    Equipment Utilized During Treatment Gait belt    Activity Tolerance Patient tolerated treatment well    Behavior During Therapy WFL for tasks assessed/performed;Anxious             Past Medical History:  Diagnosis Date   Anxiety    COPD (chronic obstructive pulmonary disease) (HCC)    Heart murmur    dx at age 14   Hypertension     Past Surgical History:  Procedure Laterality Date   NECK SURGERY      There were no vitals filed for this visit.   Subjective Assessment - 03/12/21 0914     Subjective Pt reports balance issues have been going on for 1 year. Pt reports of 4 falls in last 6 months. Majority of falls happen backwards. Pt reports her legs feel weak and she cannot squat. She just started using quad cane after her initial evaluation.    Limitations Standing;Walking    Patient Stated Goals To get around better    Currently in Pain? No/denies    Pain Onset More than a month ago                [facility] PT Assessment - 03/12/21 0925       Functional Tests   Functional tests Squat      Squat   Comments 30 sec sit to stand test: 14 reps no HHA (Norm = 15 reps)      Standardized Balance Assessment   Standardized Balance Assessment Five Times Sit to Stand    Five times sit to stand comments  14 sec without HHA       Functional Gait  Assessment   Gait assessed  Yes    Gait Level Surface Walks 20 ft, slow speed, abnormal gait pattern, evidence for imbalance or deviates 10-15 in outside of the 12 in walkway width. Requires more than 7 sec to ambulate 20 ft.    Change in Gait Speed Makes only minor adjustments to walking speed, or accomplishes a change in speed with significant gait deviations, deviates 10-15 in outside the 12 in walkway width, or changes speed but loses balance but is able to recover and continue walking.    Gait with Horizontal Head Turns Performs head turns with moderate changes in gait velocity, slows down, deviates 10-15 in outside 12 in walkway width but recovers, can continue to walk.    Gait with Vertical Head Turns Performs task with moderate change in gait velocity, slows down, deviates 10-15 in outside 12 in walkway width but recovers, can continue to walk.    Gait and Pivot Turn Turns slowly, requires verbal cueing, or requires several small steps to catch balance following turn and stop    Step Over Obstacle Is able to  step over one shoe box (4.5 in total height) but must slow down and adjust steps to clear box safely. May require verbal cueing.    Gait with Narrow Base of Support Ambulates less than 4 steps heel to toe or cannot perform without assistance.    Gait with Eyes Closed Walks 20 ft, slow speed, abnormal gait pattern, evidence for imbalance, deviates 10-15 in outside 12 in walkway width. Requires more than 9 sec to ambulate 20 ft.    Ambulating Backwards Walks 20 ft, slow speed, abnormal gait pattern, evidence for imbalance, deviates 10-15 in outside 12 in walkway width.    Steps Alternating feet, must use rail.    Total Score 10    FGA comment: 10/30 indicating high fall risk               standing heel raises and toe raises: 2 x 10 bil without HHA Pt educated on using quad cane at all times at home. Pt is currently using quad cane only in community and not  using at home. Pt educated on benefit of cane to reduce fall frequency and improve safety awareness.                       PT Short Term Goals - 02/19/21 1004       PT SHORT TERM GOAL #1   Title Pt will report uderstanding of safety equipment recommendation to decreased risk of falling    Status New    Target Date 03/01/21               PT Long Term Goals - 02/19/21 1005       PT LONG TERM GOAL #1   Title Pt will be Ind in a HEP to address cordination, strength, and ambualtion deficits    Status New    Target Date 04/26/21      PT LONG TERM GOAL #2   Title Increase bilat hip strength to 4/5 or >, knee strength to 5/5, and ankle strength to 4/5 or > for improved funcitonal mobility and safety    Status New    Target Date 04/26/21      PT LONG TERM GOAL #3   Title Pt's safety will improve to supervion or less for community ambulation of 367ft with or without and AD for greater safety with functional mobility    Baseline min guard 162ft without AD    Status New    Target Date 04/26/21      PT LONG TERM GOAL #4   Title Pt will report improved safety with mobility with her experiencing no falls over the course of PT.    Status New    Target Date 04/26/21                   Plan - 03/12/21 0950     Clinical Impression Statement Pt scored 10/30 on FGA implicating significant fall risk. Patient 5x/ sit to stand and 30 sec sit to stand weren't significantly affected. Patient demonstrates excessive hip internal rotation and ankle inversion with gait. Pt did not know she was doing that. Patient will benefit from skilled PT to address these impairments and improve function.    Personal Factors and Comorbidities Age;Comorbidity 1;Comorbidity 2;Comorbidity 3+;Time since onset of injury/illness/exacerbation;Past/Current Experience    Comorbidities COPD, HTN, anxiety    Examination-Activity Limitations Locomotion Level;Squat;Stand;Stairs;Transfers     Stability/Clinical Decision Making Evolving/Moderate complexity    Rehab Potential Good  PT Frequency 2x / week    PT Duration 8 weeks    PT Treatment/Interventions ADLs/Self Care Home Management;Moist Heat;Gait training;Stair training;Functional mobility training;Therapeutic activities;Therapeutic exercise;Balance training;Neuromuscular re-education;Patient/family education;Manual techniques;Passive range of motion;Taping;Dry needling    PT Next Visit Plan Continue with specific balance assessment tools and set goals. Continue development of a HEP to address strength and balance deficits    PT Home Exercise Plan [email or web address]; Access Code [email or web address]    Consulted and Agree with Plan of Care Patient             Patient will benefit from skilled therapeutic intervention in order to improve the following deficits and impairments:  Abnormal gait, Difficulty walking, Obesity, Decreased activity tolerance, Pain, Decreased balance, Decreased strength, Postural dysfunction  Visit Diagnosis: Other abnormalities of gait and mobility  Muscle weakness (generalized)  Other lack of coordination  Balance disorder  Difficulty in walking, not elsewhere classified     Problem List Patient Active Problem List   Diagnosis Date Noted   Gastroesophageal reflux disease    COPD mixed type (HCC)    Hypertension 05/14/2017   Depression with anxiety 05/14/2017   Multifocal pneumonia 05/14/2017   Current smoker 05/14/2017    Ileana Ladd, PT 03/12/2021, 10:06 AM   [facility] [address] [place], [place], [address] Phone: [phone]   Fax:  [phone]  Name: Molly [name] MRN: [id number] Date of Birth: 1956/04/04

## 2021-03-17 ENCOUNTER — Other Ambulatory Visit: Payer: Self-pay

## 2021-03-17 ENCOUNTER — Ambulatory Visit: Payer: Medicare Other

## 2021-03-17 DIAGNOSIS — R278 Other lack of coordination: Secondary | ICD-10-CM | POA: Diagnosis not present

## 2021-03-17 DIAGNOSIS — M6281 Muscle weakness (generalized): Secondary | ICD-10-CM | POA: Diagnosis not present

## 2021-03-17 DIAGNOSIS — R2689 Other abnormalities of gait and mobility: Secondary | ICD-10-CM

## 2021-03-17 DIAGNOSIS — R262 Difficulty in walking, not elsewhere classified: Secondary | ICD-10-CM

## 2021-03-17 NOTE — Therapy (Signed)
[facility] [address] [place], [place], [address] Phone: [phone]   Fax:  [phone]  Physical Therapy Treatment  Patient Details  Name: Molly [name] MRN: [id number] Date of Birth: 11/01/1955 Referring Provider (PT): Clovis Riley, L.August Saucer, MD   Encounter Date: 03/17/2021   PT End of Session - 03/17/21 0931     Visit Number 3    Number of Visits 17    Date for PT Re-Evaluation 04/26/21    Authorization Type Primary-UHC MEDICARE, Secondary-MEDICAID [place] ACCESS    Progress Note Due on Visit 10    PT Start Time 0930    PT Stop Time 1015    PT Time Calculation (min) 45 min    Equipment Utilized During Treatment Gait belt    Activity Tolerance Patient tolerated treatment well    Behavior During Therapy WFL for tasks assessed/performed;Anxious             Past Medical History:  Diagnosis Date   Anxiety    COPD (chronic obstructive pulmonary disease) (HCC)    Heart murmur    dx at age 48   Hypertension     Past Surgical History:  Procedure Laterality Date   NECK SURGERY      There were no vitals filed for this visit.   Subjective Assessment - 03/17/21 0933     Subjective I had few instances where I almost lost balance. I have some back pain that always bothers me.    Limitations Standing;Walking    Patient Stated Goals To get around better    Currently in Pain? Yes    Pain Score 7     Pain Location Back    Pain Orientation Right;Left    Pain Descriptors / Indicators Aching    Pain Type Chronic pain    Pain Onset More than a month ago    Pain Frequency Constant              Following done without AD Gait training: 1 x 690' without AD, cues for arm swings and trunk rotations and step length Sit to stand from low mat table with airex under feet: 2 x 5 Standing deadlifts: 5lbs while standing on foam: 10x, 2 instances of posterior LOB min A Obstacle negotiation: stepping over 4" foam  beam, stepping around 2 cones: CGA with cues to keep foot placement neutral and not turn foot inward when stepping over. Standing box taps: 6" 15x R and L no HHA Step ups: 6" box: 5x R and L no HHA Fast walking over 20 feet and walking bwd slowly 20 feet: 3 x 20 feet Walking fwd and bwd on incline and decline: 3x each  Gait training: 1 x 460' with fast walking, vertical and horizontal head turns, walking with eyes closed, walking normal speed with arm swings.                          PT Short Term Goals - 02/19/21 1004       PT SHORT TERM GOAL #1   Title Pt will report uderstanding of safety equipment recommendation to decreased risk of falling    Status New    Target Date 03/01/21               PT Long Term Goals - 02/19/21 1005       PT LONG TERM GOAL #1   Title Pt will be Ind in a HEP to address  cordination, strength, and ambualtion deficits    Status New    Target Date 04/26/21      PT LONG TERM GOAL #2   Title Increase bilat hip strength to 4/5 or >, knee strength to 5/5, and ankle strength to 4/5 or > for improved funcitonal mobility and safety    Status New    Target Date 04/26/21      PT LONG TERM GOAL #3   Title Pt's safety will improve to supervion or less for community ambulation of 375ft with or without and AD for greater safety with functional mobility    Baseline min guard 138ft without AD    Status New    Target Date 04/26/21      PT LONG TERM GOAL #4   Title Pt will report improved safety with mobility with her experiencing no falls over the course of PT.    Status New    Target Date 04/26/21                   Plan - 03/17/21 0941     Clinical Impression Statement Pt demo increased step length and gait propulsion when arm swings were incorporated and decreased deviation from straight line. Patient demonstrated extension thrust in L knee with sit to stand as she fatigued indicating weakness in L knee. Pt had 2 instances  of posterior LOB with deadlift where she needed to brace against mat table to prevent fall with her knees. Pt demo excessive pelvis rotation with fast walking due to decreased motor control during swing phase.    Personal Factors and Comorbidities Age;Comorbidity 1;Comorbidity 2;Comorbidity 3+;Time since onset of injury/illness/exacerbation;Past/Current Experience    Comorbidities COPD, HTN, anxiety    Examination-Activity Limitations Locomotion Level;Squat;Stand;Stairs;Transfers    Stability/Clinical Decision Making Evolving/Moderate complexity    Rehab Potential Good    PT Frequency 2x / week    PT Duration 8 weeks    PT Treatment/Interventions ADLs/Self Care Home Management;Moist Heat;Gait training;Stair training;Functional mobility training;Therapeutic activities;Therapeutic exercise;Balance training;Neuromuscular re-education;Patient/family education;Manual techniques;Passive range of motion;Taping;Dry needling    PT Next Visit Plan Continue to work on dynamic gait and balance without AD and HHA; functional reaching on non compliant surfaces    PT Home Exercise Plan [email or web address]; Access Code [email or web address]    Consulted and Agree with Plan of Care Patient             Patient will benefit from skilled therapeutic intervention in order to improve the following deficits and impairments:  Abnormal gait, Difficulty walking, Obesity, Decreased activity tolerance, Pain, Decreased balance, Decreased strength, Postural dysfunction  Visit Diagnosis: Other abnormalities of gait and mobility  Muscle weakness (generalized)  Other lack of coordination  Balance disorder  Difficulty in walking, not elsewhere classified     Problem List Patient Active Problem List   Diagnosis Date Noted   Gastroesophageal reflux disease    COPD mixed type (HCC)    Hypertension 05/14/2017   Depression with anxiety 05/14/2017   Multifocal pneumonia 05/14/2017   Current smoker 05/14/2017    Ileana Ladd,  PT 03/17/2021, 10:11 AM  [facility] [address] [place], [place], [address] Phone: [phone]   Fax:  [phone]  Name: Molly [name] MRN: [id number] Date of Birth: [birth year]

## 2021-03-21 ENCOUNTER — Ambulatory Visit: Payer: Medicare Other | Admitting: Physical Therapy

## 2021-03-21 ENCOUNTER — Encounter: Payer: Self-pay | Admitting: Physical Therapy

## 2021-03-21 ENCOUNTER — Other Ambulatory Visit: Payer: Self-pay

## 2021-03-21 DIAGNOSIS — R2689 Other abnormalities of gait and mobility: Secondary | ICD-10-CM

## 2021-03-21 DIAGNOSIS — M6281 Muscle weakness (generalized): Secondary | ICD-10-CM | POA: Diagnosis not present

## 2021-03-21 DIAGNOSIS — R262 Difficulty in walking, not elsewhere classified: Secondary | ICD-10-CM

## 2021-03-21 DIAGNOSIS — R278 Other lack of coordination: Secondary | ICD-10-CM

## 2021-03-21 NOTE — Therapy (Signed)
[facility] [address] [place], [place], [address] Phone: [phone]   Fax:  [phone]  Physical Therapy Treatment  Patient Details  Name: Molly [name] MRN: [id number] Date of Birth: [birth year] Referring Provider (PT): Clovis Riley, L.August Saucer, MD   Encounter Date: 03/21/2021   PT End of Session - 03/21/21 0934     Visit Number 4    Number of Visits 17    Date for PT Re-Evaluation 04/26/21    Authorization Type Primary-UHC MEDICARE, Secondary-MEDICAID [place] ACCESS    Progress Note Due on Visit 10    PT Start Time 0932    PT Stop Time 1015    PT Time Calculation (min) 43 min    Equipment Utilized During Treatment Gait belt    Activity Tolerance Patient tolerated treatment well    Behavior During Therapy WFL for tasks assessed/performed;Anxious             Past Medical History:  Diagnosis Date   Anxiety    COPD (chronic obstructive pulmonary disease) (HCC)    Heart murmur    dx at age 34   Hypertension     Past Surgical History:  Procedure Laterality Date   NECK SURGERY      There were no vitals filed for this visit.   Subjective Assessment - 03/21/21 0933     Subjective No new complains. Has small base quad cane with her, carry's it in air mostly vs using it on entering to the gym. No falls or pain to report.    Limitations Standing;Walking    Patient Stated Goals To get around better    Currently in Pain? No/denies    Pain Score 0-No pain                     [facility] Adult PT Treatment/Exercise - 03/21/21 0935       Transfers   Transfers Sit to Stand;Stand to Sit    Sit to Stand 6: Modified independent (Device/Increase time)    Stand to Sit 6: Modified independent (Device/Increase time)      Ambulation/Gait   Ambulation/Gait Yes    Ambulation/Gait Assistance 4: Min guard;4: Min assist    Ambulation/Gait Assistance Details use of cane to enter/exit session, no device used in  session. 1st rep working on reciprocal arm swing, increased bil step length and wider base of support. 2cd rep working on scanning all directions, sudden stops/starts, speed changes and forward<>retro<>forward gait direction changes with min guard to min assist for balance.    Ambulation Distance (Feet) 115 Feet   x1, 230 x1, plus around clinic with session   Assistive device Small based quad cane;None    Gait Pattern Step-through pattern;Ataxic    Ambulation Surface Level;Indoor      Knee/Hip Exercises: Aerobic   Other Aerobic Scifit UE/LE's level 2.5 x 6 minutes with goal >/=60 steps per minute for strengthening and activity tolerance.                 Balance Exercises - 03/21/21 0949       Balance Exercises: Standing   Standing Eyes Closed Foam/compliant surface;Wide (BOA);Head turns;Other reps (comment);30 secs;Limitations    Standing Eyes Closed Limitations on airex with feet wide apart, no UE support: EC 30 sec's x 3 reps, progressing to EC head movements left<>right, up<>down for ~10 reps each, min guard to min assist for balance.    Balance Beam standing across blue foam beam with light to  no UE support on bars: alternating stepping forward to floor/back onto beam, then alternating backward stepping to floor/back onto beam for ~10 reps each. cues for increased step lenght/height and weight shifitng.    Tandem Gait Forward;Retro;Foam/compliant surface;Intermittent upper extremity support;3 reps;Limitations    Tandem Gait Limitations on blue foam beam with light UE support for 3 laps each way with cues on posture and step placement on beam. min guard to min assist for balance.    Sidestepping Foam/compliant support;Upper extremity support;3 reps;Limitations    Sidestepping Limitations on blue foam beam with light UE support for 3 laps each way with cues on posture, step length and to lift foot each time vs sliding it on beam. min guard to min assist for balance.    Other Standing  Exercises forward step ups floor<>airex with contralateral march to stance leg x 5 reps each side. UE support on bars with cues on form/technique.                  PT Short Term Goals - 02/19/21 1004       PT SHORT TERM GOAL #1   Title Pt will report uderstanding of safety equipment recommendation to decreased risk of falling    Status New    Target Date 03/01/21               PT Long Term Goals - 02/19/21 1005       PT LONG TERM GOAL #1   Title Pt will be Ind in a HEP to address cordination, strength, and ambualtion deficits    Status New    Target Date 04/26/21      PT LONG TERM GOAL #2   Title Increase bilat hip strength to 4/5 or >, knee strength to 5/5, and ankle strength to 4/5 or > for improved funcitonal mobility and safety    Status New    Target Date 04/26/21      PT LONG TERM GOAL #3   Title Pt's safety will improve to supervion or less for community ambulation of 346ft with or without and AD for greater safety with functional mobility    Baseline min guard 121ft without AD    Status New    Target Date 04/26/21      PT LONG TERM GOAL #4   Title Pt will report improved safety with mobility with her experiencing no falls over the course of PT.    Status New    Target Date 04/26/21                   Plan - 03/21/21 0935     Clinical Impression Statement Today's skilled session continued to focus on no AD with gait, balance training and strengthening with no issues noted or reported. The pt is making steady progress toward goals and should benefit from continued PT to progress toward unmet goals.    Personal Factors and Comorbidities Age;Comorbidity 1;Comorbidity 2;Comorbidity 3+;Time since onset of injury/illness/exacerbation;Past/Current Experience    Comorbidities COPD, HTN, anxiety    Examination-Activity Limitations Locomotion Level;Squat;Stand;Stairs;Transfers    Stability/Clinical Decision Making Evolving/Moderate complexity    Rehab  Potential Good    PT Frequency 2x / week    PT Duration 8 weeks    PT Treatment/Interventions ADLs/Self Care Home Management;Moist Heat;Gait training;Stair training;Functional mobility training;Therapeutic activities;Therapeutic exercise;Balance training;Neuromuscular re-education;Patient/family education;Manual techniques;Passive range of motion;Taping;Dry needling    PT Next Visit Plan Check STG (was due 03/01/21) and set new 30 day STGs as  LTGs are not due till Novemeber. continued to work on dynamic gait and balance without AD, balance on compliant surfaces, resisted gait in parallal bars to address ataxia with gait.    PT Home Exercise Plan [id number]; Access Code [email or web address]    Consulted and Agree with Plan of Care Patient             Patient will benefit from skilled therapeutic intervention in order to improve the following deficits and impairments:  Abnormal gait, Difficulty walking, Obesity, Decreased activity tolerance, Pain, Decreased balance, Decreased strength, Postural dysfunction  Visit Diagnosis: Other abnormalities of gait and mobility  Muscle weakness (generalized)  Difficulty in walking, not elsewhere classified  Other lack of coordination  Balance disorder     Problem List Patient Active Problem List   Diagnosis Date Noted   Gastroesophageal reflux disease    COPD mixed type (HCC)    Hypertension 05/14/2017   Depression with anxiety 05/14/2017   Multifocal pneumonia 05/14/2017   Current smoker 05/14/2017    Sallyanne Kuster, PTA, [facility] [address], [address] [place], [place] [address] [phone] 03/21/21, 10:24 PM   Name: Molly [name] MRN: [id number] Date of Birth: Oct 14, 1955

## 2021-03-25 ENCOUNTER — Other Ambulatory Visit: Payer: Self-pay

## 2021-03-25 ENCOUNTER — Ambulatory Visit: Payer: Medicare Other | Attending: Family Medicine

## 2021-03-25 DIAGNOSIS — R278 Other lack of coordination: Secondary | ICD-10-CM | POA: Insufficient documentation

## 2021-03-25 DIAGNOSIS — M6281 Muscle weakness (generalized): Secondary | ICD-10-CM | POA: Diagnosis not present

## 2021-03-25 DIAGNOSIS — R2689 Other abnormalities of gait and mobility: Secondary | ICD-10-CM | POA: Insufficient documentation

## 2021-03-25 DIAGNOSIS — R262 Difficulty in walking, not elsewhere classified: Secondary | ICD-10-CM | POA: Diagnosis not present

## 2021-03-25 NOTE — Therapy (Signed)
[facility] [address] [place], [place], [address] Phone: [phone]   Fax:  [phone]  Physical Therapy Treatment  Patient Details  Name: Molly [name] MRN: [id number] Date of Birth: 1955-12-28 Referring Provider (PT): Alroy Dust, L.Marlou Sa, MD   Encounter Date: 03/25/2021   PT End of Session - 03/25/21 0939     Visit Number 5    Number of Visits 17    Date for PT Re-Evaluation 04/26/21    Authorization Type Primary-UHC MEDICARE, Secondary-MEDICAID [place] ACCESS    Progress Note Due on Visit 10    PT Start Time 0930    PT Stop Time 1010    PT Time Calculation (min) 40 min    Equipment Utilized During Treatment Gait belt    Activity Tolerance Patient tolerated treatment well    Behavior During Therapy WFL for tasks assessed/performed;Anxious             Past Medical History:  Diagnosis Date   Anxiety    COPD (chronic obstructive pulmonary disease) (HCC)    Heart murmur    dx at age 64   Hypertension     Past Surgical History:  Procedure Laterality Date   NECK SURGERY      There were no vitals filed for this visit.   Subjective Assessment - 03/25/21 0939     Subjective Occaisonal stumbles. Using quad cane inside the house now    Limitations Standing;Walking    Patient Stated Goals To get around better    Currently in Pain? No/denies                 Sit to stand from chair: 0lbs: 10x Sit to stand from chair: 12lbs Kettle ball: 10x Standing on DBS balance foam with hard top: toe touches: 10x, bil OH reach with looking up: 10x Standing on floor: box taps: pt taps on seat of chair: 10x R and L, no HHA, CGA Gait training:  1 x 230' with fast and normal walking, sudden stops  2 x 10 feet walking fwd and bwd with EO and 2 x 10 feet walking fwd and bwd with eyes closed Supine piriformis and hip external rotation stretch: 2 x 30" R and L of each- added to HEP Gait training: 1 x 230 feet  without AD Bil leg press: 70 lbs 10x Uni leg press: 40lbs 10x R and L                          PT Short Term Goals - 03/25/21 1033       PT SHORT TERM GOAL #1   Title Pt will report uderstanding of safety equipment recommendation to decreased risk of falling    Baseline Goal met. No falls reported 03/25/21    Status Achieved    Target Date 03/01/21      PT SHORT TERM GOAL #2   Title Patient will be able to ambulate 100 feet on grass with or without AD and SBA    Baseline Not attempted    Time 4    Period Weeks    Status Revised    Target Date 04/22/21               PT Long Term Goals - 03/25/21 1034       PT LONG TERM GOAL #1   Title Pt will be Ind in a HEP to address cordination, strength, and ambualtion deficits  Status New      PT LONG TERM GOAL #2   Title Increase bilat hip strength to 4/5 or >, knee strength to 5/5, and ankle strength to 4/5 or > for improved funcitonal mobility and safety    Status New      PT LONG TERM GOAL #3   Title Pt's safety will improve to supervion or less for community ambulation of 312f with or without and AD for greater safety with functional mobility    Baseline min guard 1077fwithout AD, 230 feet without AD and CGA (03/25/21)    Status New      PT LONG TERM GOAL #4   Title Pt will report improved safety with mobility with her experiencing no falls over the course of PT.    Status New                   Plan - 03/25/21 0957     Clinical Impression Statement Today's session was focused on continued progression of static and dyanmic balance and gait training. Patient reported fatigue at end of the session.    Personal Factors and Comorbidities Age;Comorbidity 1;Comorbidity 2;Comorbidity 3+;Time since onset of injury/illness/exacerbation;Past/Current Experience    Comorbidities COPD, HTN, anxiety    Examination-Activity Limitations Locomotion Level;Squat;Stand;Stairs;Transfers    Stability/Clinical  Decision Making Evolving/Moderate complexity    Rehab Potential Good    PT Frequency 2x / week    PT Duration 8 weeks    PT Treatment/Interventions ADLs/Self Care Home Management;Moist Heat;Gait training;Stair training;Functional mobility training;Therapeutic activities;Therapeutic exercise;Balance training;Neuromuscular re-education;Patient/family education;Manual techniques;Passive range of motion;Taping;Dry needling    PT Next Visit Plan Check STG (was due 03/01/21) and set new 30 day STGs as LTGs are not due till [place] to work on dynamic gait and balance without AD, balance on compliant surfaces, resisted gait in parallal bars to address ataxia with gait.    PT Home Exercise Plan [id number] Code [email or web address]    Consulted and Agree with Plan of Care Patient             Patient will benefit from skilled therapeutic intervention in order to improve the following deficits and impairments:  Abnormal gait, Difficulty walking, Obesity, Decreased activity tolerance, Pain, Decreased balance, Decreased strength, Postural dysfunction  Visit Diagnosis: Muscle weakness (generalized)  Other abnormalities of gait and mobility  Difficulty in walking, not elsewhere classified  Other lack of coordination  Balance disorder     Problem List Patient Active Problem List   Diagnosis Date Noted   Gastroesophageal reflux disease    COPD mixed type ([place]   Hypertension 05/14/2017   Depression with anxiety 05/14/2017   Multifocal pneumonia 05/14/2017   Current smoker 05/14/2017    KaKerrie PleasurePT 03/25/2021, 10:37 AM  [place]u[place]hone: [phone] Fax:  [phone]: Molly BLINNRN: [id number] of Birth: 2/02-08-1955

## 2021-03-27 ENCOUNTER — Encounter: Payer: Self-pay | Admitting: Physical Therapy

## 2021-03-27 ENCOUNTER — Other Ambulatory Visit: Payer: Self-pay

## 2021-03-27 ENCOUNTER — Ambulatory Visit: Payer: Medicare Other | Admitting: Physical Therapy

## 2021-03-27 DIAGNOSIS — R278 Other lack of coordination: Secondary | ICD-10-CM | POA: Diagnosis not present

## 2021-03-27 DIAGNOSIS — R2689 Other abnormalities of gait and mobility: Secondary | ICD-10-CM | POA: Diagnosis not present

## 2021-03-27 DIAGNOSIS — M6281 Muscle weakness (generalized): Secondary | ICD-10-CM

## 2021-03-27 DIAGNOSIS — R262 Difficulty in walking, not elsewhere classified: Secondary | ICD-10-CM | POA: Diagnosis not present

## 2021-03-27 NOTE — Therapy (Signed)
[facility] [address] [place], [place], [address] Phone: [phone]   Fax:  [phone]  Physical Therapy Treatment  Patient Details  Name: Molly [name] MRN: [id number] Date of Birth: 1956-02-20 Referring Provider (PT): Alroy Dust, L.Marlou Sa, MD   Encounter Date: 03/27/2021   PT End of Session - 03/27/21 1015     Visit Number 6    Number of Visits 17    Date for PT Re-Evaluation 04/26/21    Authorization Type Primary-UHC MEDICARE, Secondary-MEDICAID [place] ACCESS    Progress Note Due on Visit 10    PT Start Time 0936    PT Stop Time 1015    PT Time Calculation (min) 39 min    Equipment Utilized During Treatment Gait belt    Activity Tolerance Patient tolerated treatment well    Behavior During Therapy WFL for tasks assessed/performed;Anxious             Past Medical History:  Diagnosis Date   Anxiety    COPD (chronic obstructive pulmonary disease) (HCC)    Heart murmur    dx at age 65   Hypertension     Past Surgical History:  Procedure Laterality Date   NECK SURGERY      There were no vitals filed for this visit.   Subjective Assessment - 03/27/21 0939     Subjective No falls, no pain, no changes since last visit.    Limitations Standing;Walking    Patient Stated Goals To get around better    Currently in Pain? No/denies                               [facility] Adult PT Treatment/Exercise - 03/27/21 0001       Ambulation/Gait   Ambulation/Gait Yes    Ambulation/Gait Assistance 4: Min guard    Ambulation/Gait Assistance Details Used cane to enter and exit PT session; other gait training performed without cane    Ambulation Distance (Feet) 80 Feet   x 2 with cane; 115 ft x 4 reps   Assistive device Small based quad cane;None    Gait Pattern Step-through pattern;Ataxic;Decreased stride length    Ambulation Surface Level;Indoor    Gait Comments Gait training with cues for  increased step length and heelstrike.  Gait with quick stop/starts, no LOB, but some hesitancy restarting giat.      Exercises   Exercises Knee/Hip;Other Exercises    Other Exercises  Seated ankle pumps x 10 reps      Knee/Hip Exercises: Stretches   Active Hamstring Stretch Right;Left;3 reps;20 seconds      Knee/Hip Exercises: Aerobic   Other Aerobic Scifit 4 extremities level 2.5 x 6 minutes with goal >/=70 steps per minute, then 2 minutes, BLEs only, >70-80 steps/min for strengthening and activity tolerance.      Knee/Hip Exercises: Standing   Functional Squat 1 set;10 reps;Limitations    Functional Squat Limitations Cues for technique and slowed pace; progressed to sidestep squat/return to midline, x 10 reps each side.                 Balance Exercises - 03/27/21 0001       Balance Exercises: Standing   Retro Gait Upper extremity support;4 reps;Limitations    Retro Gait Limitations Cues for increased heelstrike and step length forward, and equal/even step length backwards.    Sidestepping Foam/compliant support;Upper extremity support;3 reps;Limitations    Sidestepping Limitations on  blue foam beam with light UE support for 3 laps each way with cues on posture, step length and to lift foot each time vs sliding it on beam. min guard to min assist for balance.    Marching Foam/compliant surface;Upper extremity assist 2;Static;10 reps    Heel Raises Both;10 reps   on Airex   Toe Raise Both;10 reps   On Airex   Other Standing Exercises Standing on Airex, 1 UE support, forward alternating kicks x 10 reps, then forward step taps/return to midline x 10 reps                  PT Short Term Goals - 03/25/21 1033       PT SHORT TERM GOAL #1   Title Pt will report uderstanding of safety equipment recommendation to decreased risk of falling    Baseline Goal met. No falls reported 03/25/21    Status Achieved    Target Date 03/01/21      PT SHORT TERM GOAL #2   Title  Patient will be able to ambulate 100 feet on grass with or without AD and SBA    Baseline Not attempted    Time 4    Period Weeks    Status Revised    Target Date 04/22/21               PT Long Term Goals - 03/25/21 1034       PT LONG TERM GOAL #1   Title Pt will be Ind in a HEP to address cordination, strength, and ambualtion deficits    Status New      PT LONG TERM GOAL #2   Title Increase bilat hip strength to 4/5 or >, knee strength to 5/5, and ankle strength to 4/5 or > for improved funcitonal mobility and safety    Status New      PT LONG TERM GOAL #3   Title Pt's safety will improve to supervion or less for community ambulation of 337f with or without and AD for greater safety with functional mobility    Baseline min guard 1024fwithout AD, 230 feet without AD and CGA (03/25/21)    Status New      PT LONG TERM GOAL #4   Title Pt will report improved safety with mobility with her experiencing no falls over the course of PT.    Status New                   Plan - 03/27/21 2019     Clinical Impression Statement Skilled PT session focused on functional lower extremity strengthening as well as balance and gait training.  With dynamic balance, pt does require cues to slow pace at times for increased step length and foot clearance.  It appears STGs were assessed/revised last visit and pt is continueing to progress towards remaining goals.    Personal Factors and Comorbidities Age;Comorbidity 1;Comorbidity 2;Comorbidity 3+;Time since onset of injury/illness/exacerbation;Past/Current Experience    Comorbidities COPD, HTN, anxiety    Examination-Activity Limitations Locomotion Level;Squat;Stand;Stairs;Transfers    Stability/Clinical Decision Making Evolving/Moderate complexity    Rehab Potential Good    PT Frequency 2x / week    PT Duration 8 weeks    PT Treatment/Interventions ADLs/Self Care Home Management;Moist Heat;Gait training;Stair training;Functional  mobility training;Therapeutic activities;Therapeutic exercise;Balance training;Neuromuscular re-education;Patient/family education;Manual techniques;Passive range of motion;Taping;Dry needling    PT Next Visit Plan Continue to work on dynamic gait and balance without AD, balance on compliant surfaces, resisted  gait in parallal bars to address ataxia with gait.    PT Home Exercise Plan [id number]; Access Code [email or web address]    Consulted and Agree with Plan of Care Patient             Patient will benefit from skilled therapeutic intervention in order to improve the following deficits and impairments:  Abnormal gait, Difficulty walking, Obesity, Decreased activity tolerance, Pain, Decreased balance, Decreased strength, Postural dysfunction  Visit Diagnosis: Other abnormalities of gait and mobility  Muscle weakness (generalized)     Problem List Patient Active Problem List   Diagnosis Date Noted   Gastroesophageal reflux disease    COPD mixed type ([place])    Hypertension 05/14/2017   Depression with anxiety 05/14/2017   Multifocal pneumonia 05/14/2017   Current smoker 05/14/2017    Quinley Nesler W., PT 03/27/2021, 8:23 PM  [facility] [address] [place], [place], [address] Phone: [phone]   Fax:  [phone]  Name: KYRIELLE URBANSKI MRN: [id number] Date of Birth: Jan 05, 1956

## 2021-03-28 ENCOUNTER — Ambulatory Visit: Payer: Medicare Other

## 2021-03-29 DIAGNOSIS — Z1231 Encounter for screening mammogram for malignant neoplasm of breast: Secondary | ICD-10-CM | POA: Diagnosis not present

## 2021-04-01 ENCOUNTER — Ambulatory Visit: Payer: Medicare Other | Admitting: Rehabilitation

## 2021-04-03 ENCOUNTER — Ambulatory Visit: Payer: Medicare Other

## 2021-04-03 ENCOUNTER — Other Ambulatory Visit: Payer: Self-pay

## 2021-04-03 DIAGNOSIS — M6281 Muscle weakness (generalized): Secondary | ICD-10-CM

## 2021-04-03 DIAGNOSIS — R278 Other lack of coordination: Secondary | ICD-10-CM

## 2021-04-03 DIAGNOSIS — R2689 Other abnormalities of gait and mobility: Secondary | ICD-10-CM | POA: Diagnosis not present

## 2021-04-03 DIAGNOSIS — R262 Difficulty in walking, not elsewhere classified: Secondary | ICD-10-CM

## 2021-04-03 NOTE — Therapy (Signed)
[facility] [address] [place], [place], [address] Phone: [phone]   Fax:  [phone]  Physical Therapy Treatment  Patient Details  Name: Molly [name] MRN: [id number] Date of Birth: [birth year] Referring Provider (PT): Alroy Dust, L.Marlou Sa, MD   Encounter Date: 04/03/2021   PT End of Session - 04/03/21 0941     Visit Number 7    Number of Visits 17    Date for PT Re-Evaluation 04/26/21    Authorization Type Primary-UHC MEDICARE, Secondary-MEDICAID [place] ACCESS    Progress Note Due on Visit 10    PT Start Time 0930    PT Stop Time 1015    PT Time Calculation (min) 45 min    Equipment Utilized During Treatment Gait belt    Activity Tolerance Patient tolerated treatment well    Behavior During Therapy WFL for tasks assessed/performed;Anxious             Past Medical History:  Diagnosis Date   Anxiety    COPD (chronic obstructive pulmonary disease) (HCC)    Heart murmur    dx at age 70   Hypertension     Past Surgical History:  Procedure Laterality Date   NECK SURGERY      There were no vitals filed for this visit.   Subjective Assessment - 04/03/21 0942     Subjective no pain, no falls. She stumbles at home but catches herself.    Limitations Standing;Walking    Patient Stated Goals To get around better                 Sit to stand: 14" box: 2 x 7 Standing heel raises and toe raises: EC on floor: 2 x 10 Standing deadlift: 10lbs 2 x 10 R and L with KB from floor. Step ladder: - reciprocal stepping: 4 laps fwd only - lateral stepping: 2 laps each way - in in out out: fwd: 2 laps - in in out out: lateral: 2 laps                          PT Short Term Goals - 03/25/21 1033       PT SHORT TERM GOAL #1   Title Pt will report uderstanding of safety equipment recommendation to decreased risk of falling    Baseline Goal met. No falls reported 03/25/21     Status Achieved    Target Date 03/01/21      PT SHORT TERM GOAL #2   Title Patient will be able to ambulate 100 feet on grass with or without AD and SBA    Baseline Not attempted    Time 4    Period Weeks    Status Revised    Target Date 04/22/21               PT Long Term Goals - 03/25/21 1034       PT LONG TERM GOAL #1   Title Pt will be Ind in a HEP to address cordination, strength, and ambualtion deficits    Status New      PT LONG TERM GOAL #2   Title Increase bilat hip strength to 4/5 or >, knee strength to 5/5, and ankle strength to 4/5 or > for improved funcitonal mobility and safety    Status New      PT LONG TERM GOAL #3   Title Pt's safety will improve to supervion or less for community  ambulation of 331f with or without and AD for greater safety with functional mobility    Baseline min guard 1027fwithout AD, 230 feet without AD and CGA (03/25/21)    Status New      PT LONG TERM GOAL #4   Title Pt will report improved safety with mobility with her experiencing no falls over the course of PT.    Status New                   Plan - 04/03/21 0955     Clinical Impression Statement Pt demonstrated fatigue with deeper sit to stand squats and deadlift today. Pt continues to demonstrate in toeing with walking which may be getting slightly better. Progressed gait and balance activities with step ladder drills.             Patient will benefit from skilled therapeutic intervention in order to improve the following deficits and impairments:     Visit Diagnosis: Other abnormalities of gait and mobility  Muscle weakness (generalized)  Difficulty in walking, not elsewhere classified  Other lack of coordination  Balance disorder     Problem List Patient Active Problem List   Diagnosis Date Noted   Gastroesophageal reflux disease    COPD mixed type ([facility]   Hypertension 05/14/2017   Depression with anxiety 05/14/2017   Multifocal pneumonia  05/14/2017   Current smoker 05/14/2017    KaKerrie PleasurePT 04/03/2021, 10:03 AM  [place]hone: [phone] Fax:  [phone]: Molly [name]: [id number] of Birth: 2/13-Jan-1956

## 2021-04-08 ENCOUNTER — Other Ambulatory Visit: Payer: Self-pay

## 2021-04-08 ENCOUNTER — Ambulatory Visit: Payer: Medicare Other | Admitting: Rehabilitation

## 2021-04-08 ENCOUNTER — Encounter: Payer: Self-pay | Admitting: Rehabilitation

## 2021-04-08 DIAGNOSIS — R262 Difficulty in walking, not elsewhere classified: Secondary | ICD-10-CM

## 2021-04-08 DIAGNOSIS — R2689 Other abnormalities of gait and mobility: Secondary | ICD-10-CM

## 2021-04-08 DIAGNOSIS — R278 Other lack of coordination: Secondary | ICD-10-CM | POA: Diagnosis not present

## 2021-04-08 DIAGNOSIS — M6281 Muscle weakness (generalized): Secondary | ICD-10-CM | POA: Diagnosis not present

## 2021-04-08 NOTE — Therapy (Signed)
[facility] [address] [place], [place], [address] Phone: [phone]   Fax:  [phone]  Physical Therapy Treatment  Patient Details  Name: Molly [name] MRN: [id number] Date of Birth: 24-Jan-1956 Referring Provider (PT): Alroy Dust, L.Marlou Sa, MD   Encounter Date: 04/08/2021   PT End of Session - 04/08/21 1503     Visit Number 8    Number of Visits 17    Date for PT Re-Evaluation 04/26/21    Authorization Type Primary-UHC MEDICARE, Secondary-MEDICAID [place] ACCESS    Progress Note Due on Visit 10    PT Start Time 0934    PT Stop Time 1015    PT Time Calculation (min) 41 min    Equipment Utilized During Treatment Gait belt    Activity Tolerance Patient tolerated treatment well    Behavior During Therapy WFL for tasks assessed/performed;Anxious             Past Medical History:  Diagnosis Date   Anxiety    COPD (chronic obstructive pulmonary disease) (HCC)    Heart murmur    dx at age 3   Hypertension     Past Surgical History:  Procedure Laterality Date   NECK SURGERY      There were no vitals filed for this visit.   Subjective Assessment - 04/08/21 0936     Subjective Pt reports feeling wobbly this morning, like legs are tired.    Limitations Standing;Walking    Patient Stated Goals To get around better    Currently in Pain? No/denies                               [facility] Adult PT Treatment/Exercise - 04/08/21 0939       Ambulation/Gait   Ambulation/Gait Yes    Ambulation/Gait Assistance 4: Min guard;4: Min assist    Ambulation/Gait Assistance Details Pt utilized cane in/out of clinic at min/guard level once with therapist due to her feeling unsteady.  Ambulated without AD throughout session at min/guard to min A level.  Cues for increased stride length and step width.  She is able to correct, however does not carry over without continued cues.    Ambulation Distance  (Feet) 200 Feet   then 40'x 2   Assistive device Small based quad cane;None    Gait Pattern Step-through pattern;Ataxic;Decreased stride length    Ambulation Surface Level;Indoor      Neuro Re-ed    Neuro Re-ed Details  high level balance in // bars to work on step strategy along with improving step length/width stepping forward over black balance beams x 10 reps then laterally x 10 reps with single UE support (being as light with support as possible).  Progressed to standing on foam balance beam perpendicularly with feet shoulder width apart holding balance x 2 sets of 30 secs (intermittent support needed), then stepping forward to floor back to beam (alt LEs) x 10 reps, then backwards step alt LEs x 10 reps.  All with single UE support being as light as possibe. Standing on rocker board biased horizontally as she tends to keep weight on LLE during previous balance tasks, feet apart EO maintaining balane (level board) x 20 secs x 2 sets progressing to lateral weight shifts within limits of stability x 10 reps. Transitioned board to facing vertically maintaining balance x 2 sets of 20 secs (she did better in this direction with cuing and light tactile  cues) then performing ant/post weight shifts x 10 reps with single>no UE support.  Again did better in this direction.      Knee/Hip Exercises: Aerobic   Nustep BUEs/LEs at level 4 resistance x 5 mins and level 3 x 3 mins maintaining rpms in the 50-60's for overall strengthening and endurance.                       PT Short Term Goals - 03/25/21 1033       PT SHORT TERM GOAL #1   Title Pt will report uderstanding of safety equipment recommendation to decreased risk of falling    Baseline Goal met. No falls reported 03/25/21    Status Achieved    Target Date 03/01/21      PT SHORT TERM GOAL #2   Title Patient will be able to ambulate 100 feet on grass with or without AD and SBA    Baseline Not attempted    Time 4    Period Weeks     Status Revised    Target Date 04/22/21               PT Long Term Goals - 04/08/21 1507       PT LONG TERM GOAL #1   Title Pt will be Ind in a HEP to address cordination, strength, and ambualtion deficits (Target Date: 04/26/21)    Status New      PT LONG TERM GOAL #2   Title Increase bilat hip strength to 4/5 or >, knee strength to 5/5, and ankle strength to 4/5 or > for improved funcitonal mobility and safety    Status New      PT LONG TERM GOAL #3   Title Pt's safety will improve to supervion or less for community ambulation of 354f with or without and AD for greater safety with functional mobility    Baseline min guard 1072fwithout AD, 230 feet without AD and CGA (03/25/21)    Status New      PT LONG TERM GOAL #4   Title Pt will report improved safety with mobility with her experiencing no falls over the course of PT.    Status New                   Plan - 04/08/21 1506     Clinical Impression Statement Skilled session focused on high level balance with emphasis on ankle, hip and stepping strategy to carryover to improved gait and balance.  Pt does well and demos improvement within session however has difficulty carrying over to gait at end of session without moderate cuing.    Personal Factors and Comorbidities Age;Comorbidity 1;Comorbidity 2;Comorbidity 3+;Time since onset of injury/illness/exacerbation;Past/Current Experience    Comorbidities COPD, HTN, anxiety    Examination-Activity Limitations Locomotion Level;Squat;Stand;Stairs;Transfers    Stability/Clinical Decision Making Evolving/Moderate complexity    Rehab Potential Good    PT Frequency 2x / week    PT Duration 8 weeks    PT Treatment/Interventions ADLs/Self Care Home Management;Moist Heat;Gait training;Stair training;Functional mobility training;Therapeutic activities;Therapeutic exercise;Balance training;Neuromuscular re-education;Patient/family education;Manual techniques;Passive range of  motion;Taping;Dry needling    PT Next Visit Plan Continue to work on dynamic gait and balance without AD, balance on compliant surfaces, resisted gait in parallal bars to address ataxia with gait.    PT Home Exercise Plan [email or web address] Code [email or web address]    Consulted and Agree with Plan of Care Patient  Patient will benefit from skilled therapeutic intervention in order to improve the following deficits and impairments:  Abnormal gait, Difficulty walking, Obesity, Decreased activity tolerance, Pain, Decreased balance, Decreased strength, Postural dysfunction  Visit Diagnosis: Other abnormalities of gait and mobility  Muscle weakness (generalized)  Difficulty in walking, not elsewhere classified     Problem List Patient Active Problem List   Diagnosis Date Noted   Gastroesophageal reflux disease    COPD mixed type ([place])    Hypertension 05/14/2017   Depression with anxiety 05/14/2017   Multifocal pneumonia 05/14/2017   Current smoker 05/14/2017    Cameron Sprang, PT, MPT [facility] [address] [place], [place], [address] Phone: [phone]   Fax:  [phone] 04/08/21, 3:09 PM   Name: Molly [name] MRN: [id number] Date of Birth: Sep 30, 1955

## 2021-04-10 ENCOUNTER — Encounter: Payer: Self-pay | Admitting: Physical Therapy

## 2021-04-10 ENCOUNTER — Other Ambulatory Visit: Payer: Self-pay

## 2021-04-10 ENCOUNTER — Ambulatory Visit: Payer: Medicare Other | Admitting: Physical Therapy

## 2021-04-10 DIAGNOSIS — R262 Difficulty in walking, not elsewhere classified: Secondary | ICD-10-CM | POA: Diagnosis not present

## 2021-04-10 DIAGNOSIS — M6281 Muscle weakness (generalized): Secondary | ICD-10-CM | POA: Diagnosis not present

## 2021-04-10 DIAGNOSIS — R278 Other lack of coordination: Secondary | ICD-10-CM | POA: Diagnosis not present

## 2021-04-10 DIAGNOSIS — R2689 Other abnormalities of gait and mobility: Secondary | ICD-10-CM

## 2021-04-11 NOTE — Therapy (Signed)
[facility] [address] [place], [place], [address] Phone: [phone]   Fax:  [phone]  Physical Therapy Treatment  Patient Details  Name: Molly [name] MRN: [id number] Date of Birth: 08-13-1955 Referring Provider (PT): Alroy Dust, L.Marlou Sa, MD   Encounter Date: 04/10/2021   PT End of Session - 04/10/21 0936     Visit Number 9    Number of Visits 17    Date for PT Re-Evaluation 04/26/21    Authorization Type Primary-UHC MEDICARE, Secondary-MEDICAID [place] ACCESS    Progress Note Due on Visit 10    PT Start Time 0934    PT Stop Time 1015    PT Time Calculation (min) 41 min    Equipment Utilized During Treatment Gait belt    Activity Tolerance Patient tolerated treatment well    Behavior During Therapy WFL for tasks assessed/performed;Anxious             Past Medical History:  Diagnosis Date   Anxiety    COPD (chronic obstructive pulmonary disease) (HCC)    Heart murmur    dx at age 37   Hypertension     Past Surgical History:  Procedure Laterality Date   NECK SURGERY      There were no vitals filed for this visit.   Subjective Assessment - 04/10/21 0936     Subjective No new complaints. No falls. Some low back achiness today.    Limitations Standing;Walking    Patient Stated Goals To get around better    Currently in Pain? No/denies    Pain Score 0-No pain                   [facility] Adult PT Treatment/Exercise - 04/10/21 0937       Transfers   Transfers Sit to Stand;Stand to Sit    Sit to Stand 6: Modified independent (Device/Increase time)    Stand to Sit 6: Modified independent (Device/Increase time)      Ambulation/Gait   Ambulation/Gait Yes    Ambulation/Gait Assistance 4: Min guard;4: Min assist    Ambulation/Gait Assistance Details pt used cane to enter/exit session with supervision<>min guard assistance; no device used in session with min guard<>min assist for balance.     Assistive device Small based quad cane;None    Gait Pattern Step-through pattern;Ataxic;Decreased stride length    Ambulation Surface Level;Indoor      Neuro Re-ed    Neuro Re-ed Details  for balance/muscle re-ed/coordination: gait around track working on speed changes, scanning all directions randomly, sudden stops/starts, and direction changes from forward<>retro<>forward. min guard to min assist.      Knee/Hip Exercises: Aerobic   Nustep BUEs/LEs at level 4 resistance x 8 minutes maintaining steps per minute  in the 50-60's for overall strengthening and endurance.                 Balance Exercises - 04/10/21 0951       Balance Exercises: Standing   Rockerboard Anterior/posterior;Head turns;EO;EC;30 seconds;Limitations    Rockerboard Limitations on balance board in anterior/posterior direction with light to no UE support: rocking the board with EO, progressing EC with min guard to min assist for balance. then holdingthe board steady for EC 30 sec's x 3 reps, progressing to [facility] with head movements left<>right, then up<>down for ~8-10 reps each with min to mod assist for balance.    Tandem Gait Forward;Retro;Foam/compliant surface;Intermittent upper extremity support;3 reps;Limitations    Tandem Gait Limitations on blue foam  beam with light UE support for 3 laps each way with cues on posture and step placement on beam. min guard to min assist for balance.    Sidestepping Foam/compliant support;Upper extremity support;3 reps;Limitations    Sidestepping Limitations on blue foam beam with light UE support for 3 laps each way with cues on posture, step length and to lift foot each time vs sliding it on beam. min guard to min assist for balance.                  PT Short Term Goals - 03/25/21 1033       PT SHORT TERM GOAL #1   Title Pt will report uderstanding of safety equipment recommendation to decreased risk of falling    Baseline Goal met. No falls reported 03/25/21     Status Achieved    Target Date 03/01/21      PT SHORT TERM GOAL #2   Title Patient will be able to ambulate 100 feet on grass with or without AD and SBA    Baseline Not attempted    Time 4    Period Weeks    Status Revised    Target Date 04/22/21               PT Long Term Goals - 04/08/21 1507       PT LONG TERM GOAL #1   Title Pt will be Ind in a HEP to address cordination, strength, and ambualtion deficits (Target Date: 04/26/21)    Status New      PT LONG TERM GOAL #2   Title Increase bilat hip strength to 4/5 or >, knee strength to 5/5, and ankle strength to 4/5 or > for improved funcitonal mobility and safety    Status New      PT LONG TERM GOAL #3   Title Pt's safety will improve to supervion or less for community ambulation of 324f with or without and AD for greater safety with functional mobility    Baseline min guard 1074fwithout AD, 230 feet without AD and CGA (03/25/21)    Status New      PT LONG TERM GOAL #4   Title Pt will report improved safety with mobility with her experiencing no falls over the course of PT.    Status New                   Plan - 04/10/21 0937     Clinical Impression Statement Today's skilled session continued to focus on gait with no device, strengthening and balance training with no issues noted or reported in session. The pt is makign steady progress toward goals and should benefit from continued PT to progress toward unmet goals.    Personal Factors and Comorbidities Age;Comorbidity 1;Comorbidity 2;Comorbidity 3+;Time since onset of injury/illness/exacerbation;Past/Current Experience    Comorbidities COPD, HTN, anxiety    Examination-Activity Limitations Locomotion Level;Squat;Stand;Stairs;Transfers    Stability/Clinical Decision Making Evolving/Moderate complexity    Rehab Potential Good    PT Frequency 2x / week    PT Duration 8 weeks    PT Treatment/Interventions ADLs/Self Care Home Management;Moist Heat;Gait  training;Stair training;Functional mobility training;Therapeutic activities;Therapeutic exercise;Balance training;Neuromuscular re-education;Patient/family education;Manual techniques;Passive range of motion;Taping;Dry needling    PT Next Visit Plan Continue to work on dynamic gait and balance without AD, balance on compliant surfaces, resisted gait in parallal bars to address ataxia with gait.    [id number] Code [email or web address]    Consulted  and Agree with Plan of Care Patient             Patient will benefit from skilled therapeutic intervention in order to improve the following deficits and impairments:  Abnormal gait, Difficulty walking, Obesity, Decreased activity tolerance, Pain, Decreased balance, Decreased strength, Postural dysfunction  Visit Diagnosis: Muscle weakness (generalized)  Other abnormalities of gait and mobility  Difficulty in walking, not elsewhere classified     Problem List Patient Active Problem List   Diagnosis Date Noted   Gastroesophageal reflux disease    COPD mixed type ([place])    Hypertension 05/14/2017   Depression with anxiety 05/14/2017   Multifocal pneumonia 05/14/2017   Current smoker 05/14/2017    Willow Ora, PTA, [facility] [address], [place], [place] [address] [phone] 04/11/21, 10:14 AM   Name: Molly [name] MRN: [id number] Date of Birth: [birth year]

## 2021-04-15 ENCOUNTER — Other Ambulatory Visit: Payer: Self-pay

## 2021-04-15 ENCOUNTER — Encounter: Payer: Self-pay | Admitting: Physical Therapy

## 2021-04-15 ENCOUNTER — Ambulatory Visit: Payer: Medicare Other | Admitting: Physical Therapy

## 2021-04-15 DIAGNOSIS — R278 Other lack of coordination: Secondary | ICD-10-CM | POA: Diagnosis not present

## 2021-04-15 DIAGNOSIS — R2689 Other abnormalities of gait and mobility: Secondary | ICD-10-CM

## 2021-04-15 DIAGNOSIS — M6281 Muscle weakness (generalized): Secondary | ICD-10-CM | POA: Diagnosis not present

## 2021-04-15 DIAGNOSIS — R262 Difficulty in walking, not elsewhere classified: Secondary | ICD-10-CM | POA: Diagnosis not present

## 2021-04-16 NOTE — Therapy (Addendum)
[facility] [address] [place], [place], [address] Phone: [phone]   Fax:  [phone]  Physical Therapy Treatment and Progress Note  Patient Details  Name: Molly [name] MRN: [id number] Date of Birth: 09/08/1955 Referring Provider (PT): Alroy Dust, L.Marlou Sa, MD   Encounter Date: 04/15/2021   PT End of Session - 04/15/21 0937     Visit Number 10    Number of Visits 17    Date for PT Re-Evaluation 04/26/21    Authorization Type Primary-UHC MEDICARE, Secondary-MEDICAID [place] ACCESS    Progress Note Due on Visit 2    PT Start Time 0935    PT Stop Time 1015    PT Time Calculation (min) 40 min    Equipment Utilized During Treatment Gait belt    Activity Tolerance Patient tolerated treatment well    Behavior During Therapy WFL for tasks assessed/performed             Past Medical History:  Diagnosis Date   Anxiety    COPD (chronic obstructive pulmonary disease) ([place])    Heart murmur    dx at age 65   Hypertension     Past Surgical History:  Procedure Laterality Date   NECK SURGERY      There were no vitals filed for this visit.   Subjective Assessment - 04/15/21 0937     Subjective No new complaints. No falls.    Limitations Standing;Walking    Patient Stated Goals To get around better    Currently in Pain? No/denies    Pain Score 0-No pain                    [facility] Adult PT Treatment/Exercise - 04/15/21 0938       Transfers   Transfers Sit to Stand;Stand to Sit    Sit to Stand 6: Modified independent (Device/Increase time)    Stand to Sit 6: Modified independent (Device/Increase time)      Ambulation/Gait   Ambulation/Gait Yes    Ambulation/Gait Assistance 4: Min guard;4: Min assist    Ambulation/Gait Assistance Details use of quad cane to enter/exit session. no device used in session.    Ambulation Distance (Feet) --   around clinic with session   Assistive device Small  based quad cane;None    Gait Pattern Step-through pattern;Ataxic;Decreased stride length    Ambulation Surface Level;Indoor      Neuro Re-ed    Neuro Re-ed Details  for balance/muscle re-ed/coordination: gait around track working on speed changes, scanning all directions randomly, sudden stops/starts, and direction changes from forward<>retro<>forward. min guard to min assist.      Knee/Hip Exercises: Aerobic   Other Aerobic Scifit 4 extremities level 3.0 x 6 minutes with goal >/=70 steps per minute, then 2 minutes, BLEs only, >70-80 steps/min for strengthening and activity tolerance.                 Balance Exercises - 04/15/21 0942       Balance Exercises: Standing   Standing Eyes Closed Foam/compliant surface;Narrow base of support (BOS);Wide (BOA)    Standing Eyes Closed Limitations on airex with hip width apart for EC 30 sec's x 3 reps, progressing to wider base of support for EC head movements left<>right, up<>down. min guard to min assist for balance.    Rockerboard Anterior/posterior;Other time (comment);EO;Other reps (comment);Intermittent UE support;Limitations;Lateral;EC    Rockerboard Limitations performed both ways on balance board with intermittent touch to walls/chair for balance assitance-  rocking the board with emphasis on tall posture with EO, progressing to EC; then holding the board steady with EC for ~30 seconds for 2-3 reps. min guard to min assist for balance with cues on technique/weight shifting.                  PT Short Term Goals - 03/25/21 1033       PT SHORT TERM GOAL #1   Title Pt will report uderstanding of safety equipment recommendation to decreased risk of falling    Baseline Goal met. No falls reported 03/25/21    Status Achieved    Target Date 03/01/21      PT SHORT TERM GOAL #2   Title Patient will be able to ambulate 100 feet on grass with or without AD and SBA    Baseline Not attempted    Time 4    Period Weeks    Status  Revised    Target Date 04/22/21               PT Long Term Goals - 04/08/21 1507       PT LONG TERM GOAL #1   Title Pt will be Ind in a HEP to address cordination, strength, and ambualtion deficits (Target Date: 04/26/21)    Status New      PT LONG TERM GOAL #2   Title Increase bilat hip strength to 4/5 or >, knee strength to 5/5, and ankle strength to 4/5 or > for improved funcitonal mobility and safety    Status New      PT LONG TERM GOAL #3   Title Pt's safety will improve to supervion or less for community ambulation of 325f with or without and AD for greater safety with functional mobility    Baseline min guard 1035fwithout AD, 230 feet without AD and CGA (03/25/21)    Status New      PT LONG TERM GOAL #4   Title Pt will report improved safety with mobility with her experiencing no falls over the course of PT.    Status New               Progress Note Reporting Period 02/18/21 to 04/15/21  See note below for Objective Data and Assessment of Progress/Goals.   Progress note entered by:  EmCameron SprangPT, MPT CoS[facility] Associates[address]hone: [phone] Fax:  [phone]/27/22, 9:15 AM        Plan - 04/15/21 [id number]   Clinical Impression Statement Today's skilled session continued to focus on dynamic gait with no device and balance training on compliant surfaces. Up to min assist needed for balance at times. Very few rest breaks needed today. The pt is making steady progress toward goals and should benefit from continued PT to progress toward unmet goals.    Personal Factors and Comorbidities Age;Comorbidity 1;Comorbidity 2;Comorbidity 3+;Time since onset of injury/illness/exacerbation;Past/Current Experience    Comorbidities COPD, HTN, anxiety    Examination-Activity Limitations Locomotion Level;Squat;Stand;Stairs;Transfers    Stability/Clinical Decision Making Evolving/Moderate  complexity    Rehab Potential Good    PT Frequency 2x / week    PT Duration 8 weeks    PT Treatment/Interventions ADLs/Self Care Home Management;Moist Heat;Gait training;Stair training;Functional mobility training;Therapeutic activities;Therapeutic exercise;Balance training;Neuromuscular re-education;Patient/family education;Manual techniques;Passive range of motion;Taping;Dry needling    PT Next Visit Plan Continue to work on dynamic gait and balance without AD, balance on compliant  surfaces, resisted gait in parallal bars to address ataxia with gait.    PT Home Exercise Plan [phone]; Access Code [email or web address]    Consulted and Agree with Plan of Care Patient             Patient will benefit from skilled therapeutic intervention in order to improve the following deficits and impairments:  Abnormal gait, Difficulty walking, Obesity, Decreased activity tolerance, Pain, Decreased balance, Decreased strength, Postural dysfunction  Visit Diagnosis: Muscle weakness (generalized)  Other abnormalities of gait and mobility  Difficulty in walking, not elsewhere classified     Problem List Patient Active Problem List   Diagnosis Date Noted   Gastroesophageal reflux disease    COPD mixed type ([place])    Hypertension 05/14/2017   Depression with anxiety 05/14/2017   Multifocal pneumonia 05/14/2017   Current smoker 05/14/2017    Willow Ora, PTA, [facility] [address] [place], [place] [address] [phone] 04/16/21, 1:10 PM   Name: Molly [name] MRN: [id number] Date of Birth: Oct 07, 1955

## 2021-04-17 ENCOUNTER — Other Ambulatory Visit: Payer: Self-pay

## 2021-04-17 ENCOUNTER — Ambulatory Visit: Payer: Medicare Other | Admitting: Rehabilitation

## 2021-04-17 ENCOUNTER — Encounter: Payer: Self-pay | Admitting: Rehabilitation

## 2021-04-17 DIAGNOSIS — R278 Other lack of coordination: Secondary | ICD-10-CM | POA: Diagnosis not present

## 2021-04-17 DIAGNOSIS — R262 Difficulty in walking, not elsewhere classified: Secondary | ICD-10-CM

## 2021-04-17 DIAGNOSIS — R2689 Other abnormalities of gait and mobility: Secondary | ICD-10-CM

## 2021-04-17 DIAGNOSIS — M6281 Muscle weakness (generalized): Secondary | ICD-10-CM | POA: Diagnosis not present

## 2021-04-17 NOTE — Therapy (Signed)
[facility] [address] [place], [place], [address] Phone: [phone]   Fax:  [phone]  Physical Therapy Treatment  Patient Details  Name: Molly [name] MRN: [id number] Date of Birth: December 10, 1955 Referring Provider (PT): Alroy Dust, L.Marlou Sa, MD   Encounter Date: 04/17/2021   PT End of Session - 04/17/21 1020     Visit Number 11    Number of Visits 17    Date for PT Re-Evaluation 04/26/21    Authorization Type Primary-[employer]HC MEDICARE, Secondary-MEDICAID [place] ACCESS    Progress Note Due on Visit 69    PT Start Time 0936    PT Stop Time 1016    PT Time Calculation (min) 40 min    Equipment Utilized During Treatment Gait belt    Activity Tolerance Patient tolerated treatment well    Behavior During Therapy WFL for tasks assessed/performed             Past Medical History:  Diagnosis Date   Anxiety    COPD (chronic obstructive pulmonary disease) ([place])    Heart murmur    dx at age 85   Hypertension     Past Surgical History:  Procedure Laterality Date   NECK SURGERY      There were no vitals filed for this visit.   Subjective Assessment - 04/17/21 0940     Subjective Reports some new onset pain in L elbow, has appt with ortho MD tomorrow.    Patient Stated Goals To get around better    Currently in Pain? Yes    Pain Score 7     Pain Location Elbow    Pain Orientation Left    Pain Descriptors / Indicators Sore;Aching    Pain Type Acute pain    Pain Onset More than a month ago    Pain Frequency Intermittent    Aggravating Factors  moving    Pain Relieving Factors not moving                [facility] PT Assessment - 04/17/21 0943       Transfers   Five time sit to stand comments  14.06 secs without UE support      Functional Gait  Assessment   Gait assessed  Yes    Gait Level Surface Walks 20 ft, slow speed, abnormal gait pattern, evidence for imbalance or deviates 10-15 in outside of  the 12 in walkway width. Requires more than 7 sec to ambulate 20 ft.   8.03   Change in Gait Speed Able to change speed, demonstrates mild gait deviations, deviates 6-10 in outside of the 12 in walkway width, or no gait deviations, unable to achieve a major change in velocity, or uses a change in velocity, or uses an assistive device.    Gait with Horizontal Head Turns Performs head turns smoothly with slight change in gait velocity (eg, minor disruption to smooth gait path), deviates 6-10 in outside 12 in walkway width, or uses an assistive device.    Gait with Vertical Head Turns Performs task with moderate change in gait velocity, slows down, deviates 10-15 in outside 12 in walkway width but recovers, can continue to walk.    Gait and Pivot Turn Pivot turns safely in greater than 3 sec and stops with no loss of balance, or pivot turns safely within 3 sec and stops with mild imbalance, requires small steps to catch balance.    Step Over Obstacle Is able to step over one shoe  box (4.5 in total height) but must slow down and adjust steps to clear box safely. May require verbal cueing.    Gait with Narrow Base of Support Ambulates less than 4 steps heel to toe or cannot perform without assistance.    Gait with Eyes Closed Cannot walk 20 ft without assistance, severe gait deviations or imbalance, deviates greater than 15 in outside 12 in walkway width or will not attempt task.    Ambulating Backwards Walks 20 ft, uses assistive device, slower speed, mild gait deviations, deviates 6-10 in outside 12 in walkway width.    Steps Alternating feet, must use rail.    Total Score 13                           [facility] Adult PT Treatment/Exercise - 04/17/21 0943       Ambulation/Gait   Ambulation/Gait Yes    Ambulation/Gait Assistance 5: Supervision;4: Min guard;4: Min assist    Ambulation/Gait Assistance Details Varying levels of assist depending on balance challenge.    Ambulation Distance  (Feet) 300 Feet    Assistive device None    Gait Pattern Step-through pattern;Ataxic;Decreased stride length    Ambulation Surface Level;Indoor      Neuro Re-ed    Neuro Re-ed Details  Spent session reviewing and updating HEP to reflect challenges found in FGA testing today.  See pt instructions for exercises performed.  Performed 1 set of all exercises.  Did perform 2 sets of tandem stance and feet narrowed with eyes closed.              Access Code: [email or web address] URL: [email or web address] Date: 04/17/2021 Prepared by: Cameron Sprang   Exercises Sit to Stand Without Arm Support - 2 x daily - 5 x weekly - 2 sets - 10 reps Seated Piriformis Stretch - 2 x daily - 5 x weekly - 1 sets - 2-3 reps - 30 secs hold Heel Walking - 2 x daily - 5 x weekly - 1 sets - 3 reps Toe Walking - 2 x daily - 5 x weekly - 1 sets - 3 reps Tandem Stance with Support - 1 x daily - 5 x weekly - 1 sets - 2 reps - 20 secs hold Romberg Stance with Eyes Closed - 2 x daily - 7 x weekly - 1 sets - 3 reps - 20 secs hold       PT Education - 04/17/21 1020     Education Details FGA findings, updated HEP    Person(s) Educated Patient    Methods Explanation;Demonstration;Handout    Comprehension Verbalized understanding;Returned demonstration              PT Short Term Goals - 03/25/21 1033       PT SHORT TERM GOAL #1   Title Pt will report uderstanding of safety equipment recommendation to decreased risk of falling    Baseline Goal met. No falls reported 03/25/21    Status Achieved    Target Date 03/01/21      PT SHORT TERM GOAL #2   Title Patient will be able to ambulate 100 feet on grass with or without AD and SBA    Baseline Not attempted    Time 4    Period Weeks    Status Revised    Target Date 04/22/21               PT Long Term Goals - 04/17/21 1023  PT LONG TERM GOAL #1   Title Pt will be Ind in a HEP to address cordination, strength, and ambualtion deficits  (Target Date: 04/26/21)    Time 8    Period Weeks    Status New      PT LONG TERM GOAL #2   Title Pt will improve FGA to >/=18/30 in order to indicate dec fall risk.    Time 8    Status New      PT LONG TERM GOAL #3   Title Pt will ambulate x 300' over level indoor and unlevel outdoor paved surfaces with LRAD at S level in order to indicate safe community mobility.    Baseline min guard 117f without AD, 230 feet without AD and CGA (03/25/21)    Time 8    Period Weeks    Status New      PT LONG TERM GOAL #4   Title Pt will improve 5TSS to </=11.5 secs without UE support in order to indicate improved functional strength.    Time 8    Period Weeks    Status New                   Plan - 04/17/21 1021     Clinical Impression Statement Skilled session focused on reassessment of balance to re-establish new LTGs.  She scored a 13/30 on FGA which is indicative of high fall risk but she has improved from a 10/30 the end of Sept.  She completed 5TSS in 14.06 secs which is about the same as when previously tested.  Based on deficits on FGA, PT reviewed and updated HEP during session.  Pt tolerated well.    Personal Factors and Comorbidities Age;Comorbidity 1;Comorbidity 2;Comorbidity 3+;Time since onset of injury/illness/exacerbation;Past/Current Experience    Comorbidities COPD, HTN, anxiety    Examination-Activity Limitations Locomotion Level;Squat;Stand;Stairs;Transfers    Stability/Clinical Decision Making Evolving/Moderate complexity    Rehab Potential Good    PT Frequency 2x / week    PT Duration 8 weeks    PT Treatment/Interventions ADLs/Self Care Home Management;Moist Heat;Gait training;Stair training;Functional mobility training;Therapeutic activities;Therapeutic exercise;Balance training;Neuromuscular re-education;Patient/family education;Manual techniques;Passive range of motion;Taping;Dry needling    PT Next Visit Plan Have her schedule more visits!! How is new HEP going??  She continues to be a high fall risk based on FGA however I don't feel like she uses cane appropriately, maybe we could try a quad tip cane?? Continue to work on dynamic gait and balance without AD, balance on compliant surfaces, resisted gait in parallal bars to address ataxia with gait.    PT Home Exercise Plan [phone] Access Code [email or web address]    Consulted and Agree with Plan of Care Patient             Patient will benefit from skilled therapeutic intervention in order to improve the following deficits and impairments:  Abnormal gait, Difficulty walking, Obesity, Decreased activity tolerance, Pain, Decreased balance, Decreased strength, Postural dysfunction  Visit Diagnosis: Muscle weakness (generalized)  Other abnormalities of gait and mobility  Difficulty in walking, not elsewhere classified     Problem List Patient Active Problem List   Diagnosis Date Noted   Gastroesophageal reflux disease    COPD mixed type ([facility]    Hypertension 05/14/2017   Depression with anxiety 05/14/2017   Multifocal pneumonia 05/14/2017   Current smoker 05/14/2017    ECameron Sprang PT, MPT [facility] StreetSHaleburgGPortage [place] [address]Phone: [phone]  Fax:  [phone] 04/17/21, 1:02 PM   Name: KEYLIN FERRYMAN MRN: [id number] Date of Birth: 1956-01-13

## 2021-04-17 NOTE — Patient Instructions (Signed)
Access Code: [email or web address] URL: [email or web address] Date: 04/17/2021 Prepared by: Harriet Butte  Exercises Sit to Stand Without Arm Support - 2 x daily - 5 x weekly - 2 sets - 10 reps Seated Piriformis Stretch - 2 x daily - 5 x weekly - 1 sets - 2-3 reps - 30 secs hold Heel Walking - 2 x daily - 5 x weekly - 1 sets - 3 reps Toe Walking - 2 x daily - 5 x weekly - 1 sets - 3 reps Tandem Stance with Support - 1 x daily - 5 x weekly - 1 sets - 2 reps - 20 secs hold Romberg Stance with Eyes Closed - 2 x daily - 7 x weekly - 1 sets - 3 reps - 20 secs hold

## 2021-04-18 DIAGNOSIS — M7702 Medial epicondylitis, left elbow: Secondary | ICD-10-CM | POA: Diagnosis not present

## 2021-04-22 ENCOUNTER — Ambulatory Visit: Payer: Medicare Other | Attending: Family Medicine

## 2021-04-22 ENCOUNTER — Other Ambulatory Visit: Payer: Self-pay

## 2021-04-22 DIAGNOSIS — R2689 Other abnormalities of gait and mobility: Secondary | ICD-10-CM | POA: Diagnosis not present

## 2021-04-22 DIAGNOSIS — R262 Difficulty in walking, not elsewhere classified: Secondary | ICD-10-CM | POA: Diagnosis not present

## 2021-04-22 DIAGNOSIS — R278 Other lack of coordination: Secondary | ICD-10-CM | POA: Insufficient documentation

## 2021-04-22 DIAGNOSIS — M6281 Muscle weakness (generalized): Secondary | ICD-10-CM | POA: Diagnosis not present

## 2021-04-22 NOTE — Therapy (Signed)
[facility] [address] [place], [place], [address] Phone: [phone]   Fax:  [phone]  Physical Therapy Treatment  Patient Details  Name: Molly [name] MRN: [id number] Date of Birth: 1956-06-19 Referring Provider (PT): Alroy Dust, L.Marlou Sa, MD   Encounter Date: 04/22/2021   PT End of Session - 04/22/21 0931     Visit Number 12    Number of Visits 17    Date for PT Re-Evaluation 04/26/21    Authorization Type Primary-UHC MEDICARE, Secondary-MEDICAID [place] ACCESS    Progress Note Due on Visit 20    PT Start Time 0931    PT Stop Time 1014    PT Time Calculation (min) 43 min    Equipment Utilized During Treatment Gait belt    Activity Tolerance Patient tolerated treatment well    Behavior During Therapy WFL for tasks assessed/performed             Past Medical History:  Diagnosis Date   Anxiety    COPD (chronic obstructive pulmonary disease) ([place])    Heart murmur    dx at age 65   Hypertension     Past Surgical History:  Procedure Laterality Date   NECK SURGERY      There were no vitals filed for this visit.   Subjective Assessment - 04/22/21 0935     Subjective Patient brought in referral for lateral epicondylitis. Reports no new changes or complaints. No pain currently. No falls. Reports updated HEP is going well.    Patient Stated Goals To get around better    Currently in Pain? No/denies    Pain Onset More than a month ago               [facility] Adult PT Treatment/Exercise - 04/22/21 0001       Transfers   Transfers Sit to Stand;Stand to Sit    Sit to Stand 6: Modified independent (Device/Increase time)    Stand to Sit 6: Modified independent (Device/Increase time)      Ambulation/Gait   Ambulation/Gait Yes    Ambulation/Gait Assistance 5: Supervision;4: Min guard    Ambulation/Gait Assistance Details Completed ambulation with SBQC x 200 ft, then followed up with SPC with quad  tip x 200 ft. More difficulty sequencing and firm contact with ground with SBQC. Patient demo improved balance also with SPC with quad tip. Patient reports she prefers the [facility] with quad tip and would like to continue to practice with this device.    Ambulation Distance (Feet) 200 Feet   x 2   Assistive device Straight cane;Small based quad cane    Gait Pattern Step-through pattern;Ataxic;Decreased stride length    Ambulation Surface Level;Indoor    Gait Comments Pt asking about purchase options for SPC with quad tip. PT educating to allow [place] to continue to practice with new AD prior to [place]. Patient in agreement.      High Level Balance   High Level Balance Activities Negotitating around obstacles    High Level Balance Comments Completed ambulation with SPC with quad tip working on negotiating around obstacles (cones), completed x 3 laps down and back with CGA. Cues for step length and contind proper use of AD at times. Patient require intermittent cues to widen BOS as patient has preference for narrow BOS further challenging balance.              Balance Exercises - 04/22/21 0001       Balance Exercises: Standing  Standing Eyes Opened Head turns;Wide (BOA);Foam/compliant surface;Limitations    Standing Eyes Opened Limitations horizontal/vertical head turns x 10 reps each direction, more imbalance noted with horizontal.    Rockerboard Anterior/posterior;EO;Intermittent UE support;Limitations    Rockerboard Limitations worked on maintaining A/P board steady 3 x 30 seconds with EO, then progressed to no UE support and working on alternating UE raises x 10 reps bilat, intermittent CGA and touch A required.    Other Standing Exercises standing on airex pad with single UE support completed forward steps fwd/backwards alternating BLE x 10 reps. more challenge noted without UE support and require CGA from PT.                  PT Short Term Goals - 04/22/21 1021       PT SHORT  TERM GOAL #1   Title Pt will report uderstanding of safety equipment recommendation to decreased risk of falling    Baseline Goal met. No falls reported 03/25/21    Status Achieved    Target Date 03/01/21      PT SHORT TERM GOAL #2   Title Patient will be able to ambulate 100 feet on grass with or without AD and SBA    Baseline Not attempted; unable to assess on 04/22/21 due to unsafe (wet grass)    Time 4    Period Weeks    Status On-going    Target Date 04/22/21               PT Long Term Goals - 04/17/21 1023       PT LONG TERM GOAL #1   Title Pt will be Ind in a HEP to address cordination, strength, and ambualtion deficits (Target Date: 04/26/21)    Time 8    Period Weeks    Status New      PT LONG TERM GOAL #2   Title Pt will improve FGA to >/=18/30 in order to indicate dec fall risk.    Time 8    Status New      PT LONG TERM GOAL #3   Title Pt will ambulate x 300' over level indoor and unlevel outdoor paved surfaces with LRAD at S level in order to indicate safe community mobility.    Baseline min guard 126f without AD, 230 feet without AD and CGA (03/25/21)    Time 8    Period Weeks    Status New      PT LONG TERM GOAL #4   Title Pt will improve 5TSS to </=11.5 secs without UE support in order to indicate improved functional strength.    Time 8    Period Weeks    Status New                   Plan - 04/22/21 1017     Clinical Impression Statement Completed gait training with SBQC vs. SPC with quad tip to determine most beneficial AD for paitent. Less instability noted with SPC with quad tip today, and also improved sequencing noted. Rest of session focused on continued balance on complaint surfaces and negotiation around obstacles. Will continue to progress toward all LTGs.    Personal Factors and Comorbidities Age;Comorbidity 1;Comorbidity 2;Comorbidity 3+;Time since onset of injury/illness/exacerbation;Past/Current Experience    Comorbidities COPD,  HTN, anxiety    Examination-Activity Limitations Locomotion Level;Squat;Stand;Stairs;Transfers    Stability/Clinical Decision Making Evolving/Moderate complexity    Rehab Potential Good    PT Frequency 2x / week  PT Duration 8 weeks    PT Treatment/Interventions ADLs/Self Care Home Management;Moist Heat;Gait training;Stair training;Functional mobility training;Therapeutic activities;Therapeutic exercise;Balance training;Neuromuscular re-education;Patient/family education;Manual techniques;Passive range of motion;Taping;Dry needling    PT Next Visit Plan Will need to check goals + Re-cert? She does have a new order for lateral epicondylitis. Have her schedule more visits!! We trialed new SPC with quad tip, I beleive she looked better with this AD vs. SBQC.  Continue to work on dynamic gait and balance without AD, balance on compliant surfaces, resisted gait in parallal bars to address ataxia with gait.    PT Home Exercise Plan [phone]; Access Code [email or web address]    Consulted and Agree with Plan of Care Patient             Patient will benefit from skilled therapeutic intervention in order to improve the following deficits and impairments:  Abnormal gait, Difficulty walking, Obesity, Decreased activity tolerance, Pain, Decreased balance, Decreased strength, Postural dysfunction  Visit Diagnosis: Muscle weakness (generalized)  Other abnormalities of gait and mobility  Difficulty in walking, not elsewhere classified     Problem List Patient Active Problem List   Diagnosis Date Noted   Gastroesophageal reflux disease    COPD mixed type ([place])    Hypertension 05/14/2017   Depression with anxiety 05/14/2017   Multifocal pneumonia 05/14/2017   Current smoker 05/14/2017    Jones Bales, PT, DPT 04/22/2021, 11:09 AM  [facility] [address] [place], [place], [address] Phone: [phone]   Fax:  [phone]  Name:  Molly [name] MRN: [id number] Date of Birth: 12-29-1955

## 2021-04-24 ENCOUNTER — Other Ambulatory Visit: Payer: Self-pay

## 2021-04-24 ENCOUNTER — Ambulatory Visit: Payer: Medicare Other | Admitting: Physical Therapy

## 2021-04-24 ENCOUNTER — Encounter: Payer: Self-pay | Admitting: Physical Therapy

## 2021-04-24 DIAGNOSIS — M6281 Muscle weakness (generalized): Secondary | ICD-10-CM

## 2021-04-24 DIAGNOSIS — R278 Other lack of coordination: Secondary | ICD-10-CM | POA: Diagnosis not present

## 2021-04-24 DIAGNOSIS — R262 Difficulty in walking, not elsewhere classified: Secondary | ICD-10-CM

## 2021-04-24 DIAGNOSIS — R2689 Other abnormalities of gait and mobility: Secondary | ICD-10-CM | POA: Diagnosis not present

## 2021-04-24 NOTE — Therapy (Signed)
[facility] [address] [place], [place], [address] Phone: [phone]   Fax:  [phone]  Physical Therapy Treatment  Patient Details  Name: Molly [name] MRN: [id number] Date of Birth: 1956-04-14 Referring Provider (PT): Alroy Dust, L.Marlou Sa, MD   Encounter Date: 04/24/2021   PT End of Session - 04/24/21 1027     Visit Number 13    Number of Visits 17    Date for PT Re-Evaluation 04/26/21    Authorization Type Primary-UHC MEDICARE, Secondary-MEDICAID [place] ACCESS    Progress Note Due on Visit 20    PT Start Time 0932    PT Stop Time 1012    PT Time Calculation (min) 40 min    Equipment Utilized During Treatment Gait belt    Activity Tolerance Patient tolerated treatment well    Behavior During Therapy WFL for tasks assessed/performed             Past Medical History:  Diagnosis Date   Anxiety    COPD (chronic obstructive pulmonary disease) ([facility])    Heart murmur    dx at age 65   Hypertension     Past Surgical History:  Procedure Laterality Date   NECK SURGERY      There were no vitals filed for this visit.   Subjective Assessment - 04/24/21 0934     Subjective Patient has falls or new complaints. The pt expressed questions about how thearapy for her left elbow would go.    Limitations Standing;Walking    Patient Stated Goals To get around better    Currently in Pain? No/denies    Pain Onset More than a month ago                      [facility] Adult PT Treatment/Exercise - 04/24/21 0944       Transfers   Transfers Sit to Stand;Stand to Sit    Sit to Stand 6: Modified independent (Device/Increase time)    Stand to Sit 6: Modified independent (Device/Increase time)      Ambulation/Gait   Ambulation/Gait Yes    Ambulation/Gait Assistance 5: Supervision;4: Min guard    Ambulation/Gait Assistance Details Pt demonstrated difficulty maintaing gait sequence with AD. Requires cues to  regain proper sequence when walking straight, and unable to maintain sequence and placement of straight cane when turning. Pt ambulated on level indoor and unlevel outdoor surfaces, and demonstrated more frequent LOB on unlevel surfaces.    Ambulation Distance (Feet) 300 Feet   230 ft around track, 300 ft out on sidewalk   Assistive device Straight cane   Straight tip cane with quad tip   Gait Pattern Step-through pattern;Ataxic;Decreased stride length    Ambulation Surface Level;Indoor;Unlevel;Outdoor      High Level Balance   High Level Balance Activities Negotitating around obstacles    High Level Balance Comments Completed ambulation with SPC with quad tip working on negotiating around obstacles (cones), completed x 3 laps down and back with CGA. Cues for proper sequence and placement of SPC when turning around cones, however pt was unable to maintain sequencing/placement with cues.      Neuro Re-ed    Neuro Re-ed Details  For LE strengthening and nuero re-ed: Pt performed forward and backwards step ups in // bars using the 6" step and one UE support using two fingers. Pt required min guard/assist for safety and cues for technique. The pt performed 1 set of 10 reps for each LE  for both forward/backwards step ups.                 Balance Exercises - 04/24/21 0953       Balance Exercises: Standing   Other Standing Exercises Standing at steps using two finger support from 1 UE. The pt performed 10 alternating toe taps on first step, then 10 alternating toe taps up first and second step, then stepping back down. The pt demonstrated dificulty maintaining sequencing of tapping up first and second step, but was able to maintain sequencing for tapping first step only. The pt also demonstrated lateral pelvic instability on the stance leg when tapping with B LE's.    Other Standing Exercises Comments --                  PT Short Term Goals - 04/24/21 1103       PT SHORT TERM GOAL  #1   Title Pt will report uderstanding of safety equipment recommendation to decreased risk of falling    Baseline Goal met. No falls reported 03/25/21    Status Achieved    Target Date 03/01/21      PT SHORT TERM GOAL #2   Title Patient will be able to ambulate 100 feet on grass with or without AD and SBA    Baseline Not attempted; unable to assess on 04/22/21 due to unsafe (wet grass)    Time 4    Period Weeks    Status On-going    Target Date 04/22/21              PT Long Term Goals - 04/24/21 1105       PT LONG TERM GOAL #1   Title Pt will be Ind in a HEP to address cordination, strength, and ambualtion deficits (Target Date: 04/26/21)    Baseline Pt has HEP, and can be advanced as required.    Time 8    Period Weeks    Status Achieved      PT LONG TERM GOAL #2   Title Pt will improve FGA to >/=18/30 in order to indicate dec fall risk.    Baseline 03/12/2021 FGA 10/30; 04/17/2021 FGA 13/30    Time --    Status Partially Met      PT LONG TERM GOAL #3   Title Pt will ambulate x 300' over level indoor and unlevel outdoor paved surfaces with LRAD at S level in order to indicate safe community mobility.    Baseline min guard 164f without AD, 230 feet without AD and CGA (03/25/21); 04/24/2021 min guard 3037funlevel outdoors paved surface with SPC and quad tip, 230 ft level indoor surfaces with min guard and SPC with quad tip    Time --    Period Weeks    Status Partially Met      PT LONG TERM GOAL #4   Title Pt will improve 5TSS to </=11.5 secs without UE support in order to indicate improved functional strength.    Baseline 03/12/2021 14 sec without UE support; 04/17/2021 14.6 sec without UE support    Time --    Period Weeks    Status Not Met                      Plan - 04/24/21 1052     Clinical Impression Statement Part of today's session was assessing progress towards goals, and then [facility]. Pt continues to  demonstrate dificulty with sequencing and occaisonaly instability with gait, and requires more gait training with SPC with quad tip. The pt has made some progress towards LTGs, however could continue to benefit from skilled PT to address all functional limitations.    Personal Factors and Comorbidities Age;Comorbidity 1;Comorbidity 2;Comorbidity 3+;Time since onset of injury/illness/exacerbation;Past/Current Experience    Comorbidities COPD, HTN, anxiety    Examination-Activity Limitations Locomotion Level;Squat;Stand;Stairs;Transfers    Stability/Clinical Decision Making Evolving/Moderate complexity    Rehab Potential Good    PT Frequency 2x / week    PT Duration 8 weeks    PT Treatment/Interventions ADLs/Self Care Home Management;Moist Heat;Gait training;Stair training;Functional mobility training;Therapeutic activities;Therapeutic exercise;Balance training;Neuromuscular re-education;Patient/family education;Manual techniques;Passive range of motion;Taping;Dry needling    PT Next Visit Plan Primary PT to complete recert. Continue with gait training with SPC, resisted gait in // bars, LE strength, and dynamic balance.    PT Home Exercise Plan [phone]; Access Code [email or web address]    Consulted and Agree with Plan of Care Patient             Patient will benefit from skilled therapeutic intervention in order to improve the following deficits and impairments:  Abnormal gait, Difficulty walking, Obesity, Decreased activity tolerance, Pain, Decreased balance, Decreased strength, Postural dysfunction  Visit Diagnosis: Muscle weakness (generalized)  Other abnormalities of gait and mobility  Difficulty in walking, not elsewhere classified     Problem List Patient Active Problem List   Diagnosis Date Noted   Gastroesophageal reflux disease    COPD mixed type ([place])    Hypertension 05/14/2017   Depression with anxiety 05/14/2017   Multifocal pneumonia 05/14/2017   Current smoker 05/14/2017     Rondel Baton, SPTA 04/24/2021, 11:41 AM  [facility] [address] [place], [place], [address] Phone: [phone]   Fax:  [phone]  Name: SHALAYNA ORNSTEIN MRN: [id number] Date of Birth: January 01, 1956  This note has been reviewed and edited by supervising CI.   Willow Ora, PTA, [facility] [address], [place], [place] [address] [phone] 04/24/21, 2:39 PM

## 2021-05-01 ENCOUNTER — Telehealth: Payer: Self-pay | Admitting: Rehabilitation

## 2021-05-01 ENCOUNTER — Encounter: Payer: Self-pay | Admitting: Rehabilitation

## 2021-05-01 ENCOUNTER — Other Ambulatory Visit: Payer: Self-pay

## 2021-05-01 ENCOUNTER — Ambulatory Visit: Payer: Medicare Other | Admitting: Rehabilitation

## 2021-05-01 DIAGNOSIS — R2689 Other abnormalities of gait and mobility: Secondary | ICD-10-CM | POA: Diagnosis not present

## 2021-05-01 DIAGNOSIS — M6281 Muscle weakness (generalized): Secondary | ICD-10-CM

## 2021-05-01 DIAGNOSIS — R262 Difficulty in walking, not elsewhere classified: Secondary | ICD-10-CM | POA: Diagnosis not present

## 2021-05-01 DIAGNOSIS — R278 Other lack of coordination: Secondary | ICD-10-CM | POA: Diagnosis not present

## 2021-05-01 NOTE — Therapy (Signed)
[facility] [address] [place], [place], [address] Phone: [phone]   Fax:  [phone]  Physical Therapy Treatment  Patient Details  Name: Molly [name] MRN: [id number] Date of Birth: 09-Jan-1956 Referring Provider (PT): Clovis Riley, L.August Saucer, MD   Encounter Date: 05/01/2021   PT End of Session - 05/01/21 1309     Visit Number 14    Number of Visits 29    Date for PT Re-Evaluation 06/15/21   updated POC   Authorization Type Primary-UHC MEDICARE, Secondary-MEDICAID [place] ACCESS    Progress Note Due on Visit 20    PT Start Time 0848    PT Stop Time 0930    PT Time Calculation (min) 42 min    Equipment Utilized During Treatment Gait belt    Activity Tolerance Patient tolerated treatment well    Behavior During Therapy WFL for tasks assessed/performed             Past Medical History:  Diagnosis Date   Anxiety    COPD (chronic obstructive pulmonary disease) (HCC)    Heart murmur    dx at age 56   Hypertension     Past Surgical History:  Procedure Laterality Date   NECK SURGERY      There were no vitals filed for this visit.   Subjective Assessment - 05/01/21 0853     Subjective Pt reports no changes since last visit, no pain.  Elbow is doing well.    Limitations Standing;Walking    Patient Stated Goals To get around better    Currently in Pain? No/denies                               [facility] Adult PT Treatment/Exercise - 05/01/21 0857       Transfers   Transfers Sit to Stand;Stand to Sit    Sit to Stand 6: Modified independent (Device/Increase time)    Stand to Sit 6: Modified independent (Device/Increase time)      Ambulation/Gait   Ambulation/Gait Yes    Ambulation/Gait Assistance 5: Supervision;4: Min guard    Ambulation/Gait Assistance Details Gait during session focused on use of quad tip cane both around track and traversing around obstacles, over and stepping  up/down step all with quad tip cane.  She continues to require demo, verbal and auditory cues for landing cane at each step due to tendency to place too far ahead and cane is in tipped position.  She also tends to do better with repetition so we ambulated several reps during session.  She still intermittently IR L foot causing some LOB but is able to correct with cuing.    Ambulation Distance (Feet) 700 Feet    Assistive device Straight cane    Gait Pattern Step-through pattern;Ataxic;Decreased stride length    Ambulation Surface Level;Indoor      High Level Balance   High Level Balance Activities Negotitating around obstacles    High Level Balance Comments With use of quad tip cane around obstacles, side stepping through tight spaces, over obstacles and stepping up to 4" step and down.  Intermittent min/guard needed with cues for stepping sequence and safety with cane placement.      Knee/Hip Exercises: Aerobic   Stepper Scifit stepper x 5 mins at level 2 resistance with BUEs/LEs for generalized strengthening and endurance.  Pt able to maintain 80-90's rpms throughout.  Tolerated well.  PT Education - 05/01/21 1308     Education Details Recommended that she keep ortho follow up and if needed, request OT order for lateral epicondylitis.    Person(s) Educated Patient    Methods Explanation    Comprehension Verbalized understanding              PT Short Term Goals - 05/01/21 1316       PT SHORT TERM GOAL #1   Title Pt will be IND with HEP in order to indicate improved functional mobility and dec fall risk.  (Target Date: 05/22/21)    Time 3    Period Weeks    Status Revised    Target Date 05/22/21      PT SHORT TERM GOAL #2   Title Pt will improve FGA to >/=17/30 in order to indicate decreased fall risk.    Baseline Not attempted; unable to assess on 04/22/21 due to unsafe (wet grass)    Time 4    Period Weeks    Status On-going    Target Date  04/22/21      PT SHORT TERM GOAL #3   Title Pt will perform TUG and update goal to reflect dec fall risk.    Time 3    Period Weeks    Status New               PT Long Term Goals - 05/01/21 1323       PT LONG TERM GOAL #1   Title Pt will be Ind in a HEP to address cordination, strength, and ambualtion deficits (Target Date: 06/15/21)    Baseline ongoing goal    Time 6    Period Weeks    Status On-going    Target Date 06/15/21      PT LONG TERM GOAL #2   Title Pt will improve FGA to >/=21/30 in order to indicate dec fall risk.    Baseline 04/17/2021 FGA 13/30    Time 6    Period Weeks    Status Revised      PT LONG TERM GOAL #3   Title Pt will ambulate x 300' over level indoor and unlevel outdoor paved surfaces with LRAD at S level in order to indicate safe community mobility.    Time 6    Period Weeks    Status On-going      PT LONG TERM GOAL #4   Title Pt will improve 5TSS to </=11.5 secs without UE support in order to indicate improved functional strength.    Baseline 04/17/2021 14.6 sec without UE support    Time 6    Period Weeks    Status On-going                   Plan - 05/01/21 1311     Clinical Impression Statement Skilled session focused on repeated gait training with quad tip cane.  She reports that her old cane was not covered by insurance therefore PT will send request for order to MD for cane and she can then purchase quad tip from [employer].  Pt verbalized understanding.  Feel that pt would benefit from continued PT therefore will recert pt for 2x/wk for 6 more weeks.    Personal Factors and Comorbidities Age;Comorbidity 1;Comorbidity 2;Comorbidity 3+;Time since onset of injury/illness/exacerbation;Past/Current Experience    Comorbidities COPD, HTN, anxiety    Examination-Activity Limitations Locomotion Level;Squat;Stand;Stairs;Transfers    Stability/Clinical Decision Making Evolving/Moderate complexity    Rehab Potential Good  PT  Frequency 2x / week    PT Duration 8 weeks    PT Treatment/Interventions ADLs/Self Care Home Management;Moist Heat;Gait training;Stair training;Functional mobility training;Therapeutic activities;Therapeutic exercise;Balance training;Neuromuscular re-education;Patient/family education;Manual techniques;Passive range of motion;Taping;Dry needling    PT Next Visit Plan TUG-update goal! Continue with gait training with quad tip cane, obstacles, stairs, curb/ramp with quad tip cane, LE strength, and dynamic balance.    PT Home Exercise Plan [phone]; Access Code [email or web address]    Consulted and Agree with Plan of Care Patient             Patient will benefit from skilled therapeutic intervention in order to improve the following deficits and impairments:  Abnormal gait, Difficulty walking, Obesity, Decreased activity tolerance, Pain, Decreased balance, Decreased strength, Postural dysfunction  Visit Diagnosis: Muscle weakness (generalized)  Other abnormalities of gait and mobility  Difficulty in walking, not elsewhere classified     Problem List Patient Active Problem List   Diagnosis Date Noted   Gastroesophageal reflux disease    COPD mixed type (HCC)    Hypertension 05/14/2017   Depression with anxiety 05/14/2017   Multifocal pneumonia 05/14/2017   Current smoker 05/14/2017    Remy Dia, Meribeth Mattes, PT 05/01/2021, 1:30 PM  [facility] [address] [place], [place], [address] Phone: [phone]   Fax:  [phone]  Name: Molly [name] MRN: [id number] Date of Birth: 02/16/1956

## 2021-05-01 NOTE — Telephone Encounter (Signed)
Dr. Clovis Riley,   I am seeing Molly [name] here at [facility] for PT and she would greatly benefit from a [facility] with quad tip (rubber tip not quad cane).  If you agree, could you please write order in Epic work que or fax to [phone] for [facility] with quad tip and we will make sure she gets this.    Thanks,  Harriet Butte, PT, MPT [facility] [address] [place], [place], [address] Phone: [phone]   Fax:  [phone] 05/01/21, 3:35 PM

## 2021-05-01 NOTE — Addendum Note (Signed)
Addended by: Harriet Butte A on: 05/01/2021 01:33 PM   Modules accepted: Orders

## 2021-05-07 ENCOUNTER — Ambulatory Visit: Payer: Medicare Other | Admitting: Rehabilitation

## 2021-05-07 ENCOUNTER — Other Ambulatory Visit: Payer: Self-pay

## 2021-05-07 ENCOUNTER — Encounter: Payer: Self-pay | Admitting: Rehabilitation

## 2021-05-07 DIAGNOSIS — R2689 Other abnormalities of gait and mobility: Secondary | ICD-10-CM | POA: Diagnosis not present

## 2021-05-07 DIAGNOSIS — R278 Other lack of coordination: Secondary | ICD-10-CM

## 2021-05-07 DIAGNOSIS — R262 Difficulty in walking, not elsewhere classified: Secondary | ICD-10-CM | POA: Diagnosis not present

## 2021-05-07 DIAGNOSIS — M6281 Muscle weakness (generalized): Secondary | ICD-10-CM

## 2021-05-07 NOTE — Therapy (Signed)
[facility] [address] [place], [place], [address] Phone: [phone]   Fax:  [phone]  Physical Therapy Treatment  Patient Details  Name: Molly [name] MRN: [id number] Date of Birth: 07/29/1955 Referring Provider (PT): Clovis Riley, L.August Saucer, MD   Encounter Date: 05/07/2021   PT End of Session - 05/07/21 1224     Visit Number 15    Number of Visits 29    Date for PT Re-Evaluation 06/15/21   updated POC   Authorization Type Primary-UHC MEDICARE, Secondary-MEDICAID [place] ACCESS    Progress Note Due on Visit 20    PT Start Time 0935    PT Stop Time 1016    PT Time Calculation (min) 41 min    Equipment Utilized During Treatment Gait belt    Activity Tolerance Patient tolerated treatment well    Behavior During Therapy WFL for tasks assessed/performed             Past Medical History:  Diagnosis Date   Anxiety    COPD (chronic obstructive pulmonary disease) (HCC)    Heart murmur    dx at age 37   Hypertension     Past Surgical History:  Procedure Laterality Date   NECK SURGERY      There were no vitals filed for this visit.   Subjective Assessment - 05/07/21 0937     Subjective Reports no changes, goes to ortho MD on Nov 28    Limitations Standing;Walking    Patient Stated Goals To get around better    Currently in Pain? No/denies                               [facility] Adult PT Treatment/Exercise - 05/07/21 0948       Transfers   Transfers Sit to Stand;Stand to Sit    Sit to Stand 6: Modified independent (Device/Increase time)    Stand to Sit 6: Modified independent (Device/Increase time)      Ambulation/Gait   Ambulation/Gait Yes    Ambulation/Gait Assistance 5: Supervision    Ambulation/Gait Assistance Details Gait during session from task to task without AD at min/guard level due to intermittent narrow BOS/LE IR.    Ambulation Distance (Feet) 40 Feet   x2-3 reps    Assistive device None    Gait Pattern Step-through pattern;Ataxic;Decreased stride length    Ambulation Surface Level;Indoor      Standardized Balance Assessment   Standardized Balance Assessment Timed Up and Go Test      Timed Up and Go Test   TUG Normal TUG    Normal TUG (seconds) 14.97   with cane, 11.37 secs without cane     Neuro Re-ed    Neuro Re-ed Details  Continued with high level stepping strategy/reactionary balance tasks stepping to targets called out by PT in varying direction including posterior and crossing midline. Progressed to standing laterally on ramp, laterally stepping off ramp and back x 10 reps on each side.  Standing downhill on blue mat on ramp performing alt forward stepping x 10 reps on each side, standing uphill on ramp with feet in modified tandem x 20 sec each direction (2 sets).  Min A needed intermittently throughout for poor step strategy and postural stability.      Exercises   Other Exercises  High intensity circuit training with sit<>stand without UE assist, step ups, and lateral stepping over obstacle x 1 min each with  30 sec rest in between.  Target HR 65-85% of HR max (107-137).  She did go slightly above this during step ups which it go to 150's but quickly recovered.      Knee/Hip Exercises: Aerobic   Stepper Scifit stepper x 5 mins at level 2 resistance with BUEs/LEs for generalized strengthening and endurance.  Pt able to maintain 90's rpms throughout.  Tolerated well.                       PT Short Term Goals - 05/01/21 1316       PT SHORT TERM GOAL #1   Title Pt will be IND with HEP in order to indicate improved functional mobility and dec fall risk.  (Target Date: 05/22/21)    Time 3    Period Weeks    Status Revised    Target Date 05/22/21      PT SHORT TERM GOAL #2   Title Pt will improve FGA to >/=17/30 in order to indicate decreased fall risk.    Baseline Not attempted; unable to assess on 04/22/21 due to unsafe (wet  grass)    Time 4    Period Weeks    Status On-going    Target Date 04/22/21      PT SHORT TERM GOAL #3   Title Pt will perform TUG and update goal to reflect dec fall risk.    Time 3    Period Weeks    Status New               PT Long Term Goals - 05/01/21 1323       PT LONG TERM GOAL #1   Title Pt will be Ind in a HEP to address cordination, strength, and ambualtion deficits (Target Date: 06/15/21)    Baseline ongoing goal    Time 6    Period Weeks    Status On-going    Target Date 06/15/21      PT LONG TERM GOAL #2   Title Pt will improve FGA to >/=21/30 in order to indicate dec fall risk.    Baseline 04/17/2021 FGA 13/30    Time 6    Period Weeks    Status Revised      PT LONG TERM GOAL #3   Title Pt will ambulate x 300' over level indoor and unlevel outdoor paved surfaces with LRAD at S level in order to indicate safe community mobility.    Time 6    Period Weeks    Status On-going      PT LONG TERM GOAL #4   Title Pt will improve 5TSS to </=11.5 secs without UE support in order to indicate improved functional strength.    Baseline 04/17/2021 14.6 sec without UE support    Time 6    Period Weeks    Status On-going                   Plan - 05/07/21 1225     Clinical Impression Statement Skilled session focused on high intensity functional circuit training for improved cardiovascular endurance.  Also continue to work on high level balance with emphasis on stepping strategy, taking larger steps and crossing midline.  Pt with moderate fatigue throughout needing several rest breaks.    Personal Factors and Comorbidities Age;Comorbidity 1;Comorbidity 2;Comorbidity 3+;Time since onset of injury/illness/exacerbation;Past/Current Experience    Comorbidities COPD, HTN, anxiety    Examination-Activity Limitations Locomotion Level;Squat;Stand;Stairs;Transfers    Stability/Clinical Decision  Making Evolving/Moderate complexity    Rehab Potential Good    PT  Frequency 2x / week    PT Duration 6 weeks    PT Treatment/Interventions ADLs/Self Care Home Management;Moist Heat;Gait training;Stair training;Functional mobility training;Therapeutic activities;Therapeutic exercise;Balance training;Neuromuscular re-education;Patient/family education;Manual techniques;Passive range of motion;Taping;Dry needling    PT Next Visit Plan Continue with gait training with quad tip cane (give her order for cane if it came in), obstacles, stairs, curb/ramp with quad tip cane, LE strength, and dynamic balance.    PT Home Exercise Plan [phone]; Access Code [email or web address]    Consulted and Agree with Plan of Care Patient             Patient will benefit from skilled therapeutic intervention in order to improve the following deficits and impairments:  Abnormal gait, Difficulty walking, Obesity, Decreased activity tolerance, Pain, Decreased balance, Decreased strength, Postural dysfunction  Visit Diagnosis: Muscle weakness (generalized)  Other abnormalities of gait and mobility  Difficulty in walking, not elsewhere classified  Other lack of coordination     Problem List Patient Active Problem List   Diagnosis Date Noted   Gastroesophageal reflux disease    COPD mixed type (HCC)    Hypertension 05/14/2017   Depression with anxiety 05/14/2017   Multifocal pneumonia 05/14/2017   Current smoker 05/14/2017    Harriet Butte, PT, MPT [facility] [address] [place], [place], [address] Phone: [phone]   Fax:  [phone] 05/07/21, 12:30 PM   Name: Molly [name] MRN: [id number] Date of Birth: [birth year]

## 2021-05-09 ENCOUNTER — Ambulatory Visit: Payer: Medicare Other

## 2021-05-09 ENCOUNTER — Telehealth: Payer: Self-pay

## 2021-05-09 ENCOUNTER — Other Ambulatory Visit: Payer: Self-pay

## 2021-05-09 DIAGNOSIS — R262 Difficulty in walking, not elsewhere classified: Secondary | ICD-10-CM | POA: Diagnosis not present

## 2021-05-09 DIAGNOSIS — R2689 Other abnormalities of gait and mobility: Secondary | ICD-10-CM | POA: Diagnosis not present

## 2021-05-09 DIAGNOSIS — M6281 Muscle weakness (generalized): Secondary | ICD-10-CM

## 2021-05-09 DIAGNOSIS — R278 Other lack of coordination: Secondary | ICD-10-CM

## 2021-05-09 NOTE — Therapy (Signed)
[facility] [address] [place], [place], [address] Phone: [phone]   Fax:  [phone]  Physical Therapy Treatment  Patient Details  Name: Molly [name] MRN: [id number] Date of Birth: 29-Oct-1955 Referring Provider (PT): Clovis Riley, L.August Saucer, MD   Encounter Date: 05/09/2021   PT End of Session - 05/09/21 0937     Visit Number 16    Number of Visits 29    Date for PT Re-Evaluation 06/15/21   updated POC   Authorization Type Primary-[employer]HC MEDICARE, Secondary-MEDICAID [place] ACCESS    Progress Note Due on Visit 20    PT Start Time 0932    PT Stop Time 1014    PT Time Calculation (min) 42 min    Equipment Utilized During Treatment Gait belt    Activity Tolerance Patient tolerated treatment well    Behavior During Therapy WFL for tasks assessed/performed             Past Medical History:  Diagnosis Date   Anxiety    COPD (chronic obstructive pulmonary disease) (HCC)    Heart murmur    dx at age 36   Hypertension     Past Surgical History:  Procedure Laterality Date   NECK SURGERY      There were no vitals filed for this visit.   Subjective Assessment - 05/09/21 0935     Subjective Patient reports no new changes/complaints. Reports elbow is getting better but will see MD at end of November. No falls.    Limitations Standing;Walking    Patient Stated Goals To get around better    Currently in Pain? Yes    Pain Score 5     Pain Location Back    Pain Orientation Lower    Pain Descriptors / Indicators Aching    Pain Type Chronic pain              [facility] Adult PT Treatment/Exercise - 05/09/21 0001       Transfers   Transfers Sit to Stand;Stand to Sit    Sit to Stand 6: Modified independent (Device/Increase time)    Stand to Sit 6: Modified independent (Device/Increase time)      Ambulation/Gait   Ambulation/Gait Yes    Ambulation/Gait Assistance 5: Supervision;4: Min guard     Ambulation/Gait Assistance Details completed gait training with SPC with quad tip attahchment x 500 ft. with intermitent cues for widen BOS.    Ambulation Distance (Feet) 500 Feet    Assistive device Straight cane   with quad tip attachment   Gait Pattern Step-through pattern;Ataxic;Decreased stride length    Ambulation Surface Level;Indoor    Stairs Yes    Stairs Assistance 5: Supervision    Stairs Assistance Details (indicate cue type and reason) completed ascend/descend stairs initially with B rails progressing to single rail, reciprocal pattern    Stair Management Technique One rail Right;Two rails;Alternating pattern;Forwards    Number of Stairs 12    Height of Stairs 6    Curb 5: Supervision;4: Min assist    Curb Details (indicate cue type and reason) completed ascend/descent curb x 6 reps with use of SPC w/ quad tip. cues for sequencing required CGA intermittent.    Gait Comments PT provided patient with MD order for [facility]. Educating that she can take this to medical supply store.               Balance Exercises - 05/09/21 0001       Balance Exercises: Standing  Rockerboard Anterior/posterior;EO;Head turns;Intermittent UE support;Limitations    Rockerboard Limitations worked on maintaining A/P board steady 3 x 30 seconds with EO, then progressed to no UE support and working on horizontal/vertical head trns x 10 repsbilat, intermittent CGA and touch A required. then trialed A/P weight shift x 15 reps working on control.             PT educating patient that she will benefit from evaluation/assessment from a neurologist. Patient reports she has never been evaluated by a Neurologist, only Orthopedic. PT to reach out to PCP regarding a referral.    PT Education - 05/09/21 1015     Education Details MD Order for [facility]; Quad Tip Attachment    Person(s) Educated Patient    Methods Explanation;Handout    Comprehension Verbalized understanding              PT Short Term  Goals - 05/01/21 1316       PT SHORT TERM GOAL #1   Title Pt will be IND with HEP in order to indicate improved functional mobility and dec fall risk.  (Target Date: 05/22/21)    Time 3    Period Weeks    Status Revised    Target Date 05/22/21      PT SHORT TERM GOAL #2   Title Pt will improve FGA to >/=17/30 in order to indicate decreased fall risk.    Baseline Not attempted; unable to assess on 04/22/21 due to unsafe (wet grass)    Time 4    Period Weeks    Status On-going    Target Date 04/22/21      PT SHORT TERM GOAL #3   Title Pt will perform TUG and update goal to reflect dec fall risk.    Time 3    Period Weeks    Status New               PT Long Term Goals - 05/01/21 1323       PT LONG TERM GOAL #1   Title Pt will be Ind in a HEP to address cordination, strength, and ambualtion deficits (Target Date: 06/15/21)    Baseline ongoing goal    Time 6    Period Weeks    Status On-going    Target Date 06/15/21      PT LONG TERM GOAL #2   Title Pt will improve FGA to >/=21/30 in order to indicate dec fall risk.    Baseline 04/17/2021 FGA 13/30    Time 6    Period Weeks    Status Revised      PT LONG TERM GOAL #3   Title Pt will ambulate x 300' over level indoor and unlevel outdoor paved surfaces with LRAD at S level in order to indicate safe community mobility.    Time 6    Period Weeks    Status On-going      PT LONG TERM GOAL #4   Title Pt will improve 5TSS to </=11.5 secs without UE support in order to indicate improved functional strength.    Baseline 04/17/2021 14.6 sec without UE support    Time 6    Period Weeks    Status On-going                   Plan - 05/09/21 1016     Clinical Impression Statement Today's session focused on continued gait training, stair training and curb training with SPC with quad tip. We recieved  MD order for [facility] therefore provided to patient for her to obtain cane. Patient verbalize understanding. Rest of  session focused on high level balance, including balance strategies and weight shift. Patient tolerating well with intermittent rest breaks. Will continue per POC.    Personal Factors and Comorbidities Age;Comorbidity 1;Comorbidity 2;Comorbidity 3+;Time since onset of injury/illness/exacerbation;Past/Current Experience    Comorbidities COPD, HTN, anxiety    Examination-Activity Limitations Locomotion Level;Squat;Stand;Stairs;Transfers    Stability/Clinical Decision Making Evolving/Moderate complexity    Rehab Potential Good    PT Frequency 2x / week    PT Duration 6 weeks    PT Treatment/Interventions ADLs/Self Care Home Management;Moist Heat;Gait training;Stair training;Functional mobility training;Therapeutic activities;Therapeutic exercise;Balance training;Neuromuscular re-education;Patient/family education;Manual techniques;Passive range of motion;Taping;Dry needling    PT Next Visit Plan Continue with gait training with quad tip cane, obstacles, stairs, curb/ramp with quad tip cane, LE strength, and dynamic balance. Teach gastroc stretch and add to HEP.    PT Home Exercise Plan [phone]; Access Code [email or web address]    Consulted and Agree with Plan of Care Patient             Patient will benefit from skilled therapeutic intervention in order to improve the following deficits and impairments:  Abnormal gait, Difficulty walking, Obesity, Decreased activity tolerance, Pain, Decreased balance, Decreased strength, Postural dysfunction  Visit Diagnosis: Muscle weakness (generalized)  Other abnormalities of gait and mobility  Difficulty in walking, not elsewhere classified  Other lack of coordination     Problem List Patient Active Problem List   Diagnosis Date Noted   Gastroesophageal reflux disease    COPD mixed type (HCC)    Hypertension 05/14/2017   Depression with anxiety 05/14/2017   Multifocal pneumonia 05/14/2017   Current smoker 05/14/2017    Tempie Donning, PT,  DPT 05/09/2021, 10:19 AM  [facility] [address] [place], [place], [address] Phone: [phone]   Fax:  [phone]  Name: Molly [name] MRN: [id number] Date of Birth: August 01, 1955

## 2021-05-09 NOTE — Telephone Encounter (Signed)
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 I believe the patient would benefit from further a referral to Neurology for further evaluation for imbalance and ataxic gait.   If you agree, please place a referral and send fax to appropriate office.  Thank you, Adelfa Koh, PT, DPT  [facility] [address] [place], [place]  [address] Phone:  [phone] Fax:  [phone]   Faxed to Dr. Clovis Riley office on 05/09/21.

## 2021-05-13 ENCOUNTER — Ambulatory Visit: Payer: Medicare Other | Admitting: Physical Therapy

## 2021-05-13 ENCOUNTER — Encounter: Payer: Self-pay | Admitting: Physical Therapy

## 2021-05-13 ENCOUNTER — Other Ambulatory Visit: Payer: Self-pay

## 2021-05-13 DIAGNOSIS — M6281 Muscle weakness (generalized): Secondary | ICD-10-CM

## 2021-05-13 DIAGNOSIS — R262 Difficulty in walking, not elsewhere classified: Secondary | ICD-10-CM | POA: Diagnosis not present

## 2021-05-13 DIAGNOSIS — R278 Other lack of coordination: Secondary | ICD-10-CM | POA: Diagnosis not present

## 2021-05-13 DIAGNOSIS — R2689 Other abnormalities of gait and mobility: Secondary | ICD-10-CM | POA: Diagnosis not present

## 2021-05-13 NOTE — Therapy (Signed)
[facility] [address] [place], [place], [address] Phone: [phone]   Fax:  [phone]  Physical Therapy Treatment  Patient Details  Name: Molly [name] MRN: [id number] Date of Birth: June 10, 1956 Referring Provider (PT): Clovis Riley, L.August Saucer, MD   Encounter Date: 05/13/2021   PT End of Session - 05/13/21 0947     Visit Number 17    Number of Visits 29    Date for PT Re-Evaluation 06/15/21   updated POC   Authorization Type Primary-[employer]HC MEDICARE, Secondary-MEDICAID [facility] ACCESS    Progress Note Due on Visit 20    PT Start Time 0802    PT Stop Time 0840    PT Time Calculation (min) 38 min    Equipment Utilized During Treatment Gait belt    Activity Tolerance Patient tolerated treatment well    Behavior During Therapy WFL for tasks assessed/performed             Past Medical History:  Diagnosis Date   Anxiety    COPD (chronic obstructive pulmonary disease) (HCC)    Heart murmur    dx at age 53   Hypertension     Past Surgical History:  Procedure Laterality Date   NECK SURGERY      There were no vitals filed for this visit.   Subjective Assessment - 05/13/21 0804     Subjective Patient reports no new complaints, falls, or pain today. Pt received new straight cane with quad tip last week. Pt has been using it since.    Limitations Standing;Walking    Patient Stated Goals To get around better    Currently in Pain? No/denies    Pain Score 0-No pain                 [facility] Adult PT Treatment/Exercise - 05/13/21 0810       Transfers   Transfers Sit to Stand;Stand to Sit    Sit to Stand 6: Modified independent (Device/Increase time)    Stand to Sit 6: Modified independent (Device/Increase time)      Ambulation/Gait   Ambulation/Gait Yes    Ambulation/Gait Assistance 5: Supervision;4: Min guard    Ambulation Distance (Feet) --   Throughout session   Assistive device Straight cane    straight cane/quad tip   Gait Pattern Step-through pattern;Ataxic;Decreased stride length    Ambulation Surface Level;Indoor      Neuro Re-ed    Neuro Re-ed Details  Pt performed walking on a solid surface with straight cane for ~40 ft around cones and stepping over bolsters  x 2 down and back. Pt required min guard and verbal cues for maintaining gait pattern with straight cane. Pt demonstrated intermittent hesitation before navigating around obsticles. The pt then performed side stepping and alternating taps then double taps on red/blue mats with min HHA. Pt performed 2x each down and back. Pt denstrated occaisional "dragging" of foot off cone and knocking over of cones. The pt required intermittent cues to slow down and standing tall while tapping.      Knee/Hip Exercises: Stretches   Theme park manager Both;2 reps;20 seconds    Gastroc Stretch Limitations Performed seated calf stretch with strap 2 x 20 sec each side. Added same to HEP.                 Balance Exercises - 05/13/21 0839       Balance Exercises: Standing   Standing Eyes Closed Narrow base of support (BOS);Foam/compliant surface;3 reps;30  secs    Standing Eyes Closed Limitations Pt performed in // bars on airex with narrow BOS and EC for 3 x 30 sec's. min guard/assist and intermittent use of UE's for balance. The pt demonstrated significant lateral LOB in both directions.                PT Education - 05/13/21 0946     Education Details Had pt perform calf stretch, and added to HEP    Person(s) Educated Patient    Methods Explanation;Demonstration;Verbal cues;Handout    Comprehension Verbalized understanding;Returned demonstration              PT Short Term Goals - 05/01/21 1316       PT SHORT TERM GOAL #1   Title Pt will be IND with HEP in order to indicate improved functional mobility and dec fall risk.  (Target Date: 05/22/21)    Time 3    Period Weeks    Status Revised    Target Date 05/22/21       PT SHORT TERM GOAL #2   Title Pt will improve FGA to >/=17/30 in order to indicate decreased fall risk.    Baseline Not attempted; unable to assess on 04/22/21 due to unsafe (wet grass)    Time 4    Period Weeks    Status On-going    Target Date 04/22/21      PT SHORT TERM GOAL #3   Title Pt will perform TUG and update goal to reflect dec fall risk.    Time 3    Period Weeks    Status New               PT Long Term Goals - 05/01/21 1323       PT LONG TERM GOAL #1   Title Pt will be Ind in a HEP to address cordination, strength, and ambualtion deficits (Target Date: 06/15/21)    Baseline ongoing goal    Time 6    Period Weeks    Status On-going    Target Date 06/15/21      PT LONG TERM GOAL #2   Title Pt will improve FGA to >/=21/30 in order to indicate dec fall risk.    Baseline 04/17/2021 FGA 13/30    Time 6    Period Weeks    Status Revised      PT LONG TERM GOAL #3   Title Pt will ambulate x 300' over level indoor and unlevel outdoor paved surfaces with LRAD at S level in order to indicate safe community mobility.    Time 6    Period Weeks    Status On-going      PT LONG TERM GOAL #4   Title Pt will improve 5TSS to </=11.5 secs without UE support in order to indicate improved functional strength.    Baseline 04/17/2021 14.6 sec without UE support    Time 6    Period Weeks    Status On-going                   Plan - 05/13/21 0948     Clinical Impression Statement Today's skilled session was focused on practicing gait with straight cane around/over obsticles, along with dynamic/static balance challenges on compliant surfaces. The pt has received her straight cane with quad tip, and has been using it at home since last week without issue. The pt performed interventions with rest breaks as needed. The pt could continue to benefit from  further skilled PT to address functional deficits.    Personal Factors and Comorbidities Age;Comorbidity  1;Comorbidity 2;Comorbidity 3+;Time since onset of injury/illness/exacerbation;Past/Current Experience    Comorbidities COPD, HTN, anxiety    Examination-Activity Limitations Locomotion Level;Squat;Stand;Stairs;Transfers    Stability/Clinical Decision Making Evolving/Moderate complexity    Rehab Potential Good    PT Frequency 2x / week    PT Duration 6 weeks    PT Treatment/Interventions ADLs/Self Care Home Management;Moist Heat;Gait training;Stair training;Functional mobility training;Therapeutic activities;Therapeutic exercise;Balance training;Neuromuscular re-education;Patient/family education;Manual techniques;Passive range of motion;Taping;Dry needling    PT Next Visit Plan Continue with gait training with quad tip cane, obstacles, stairs, curb/ramp with quad tip cane, LE strength, and dynamic/static balance on compliant surfaces balance.    PT Home Exercise Plan [phone]; Access Code [email or web address]    Consulted and Agree with Plan of Care Patient             Patient will benefit from skilled therapeutic intervention in order to improve the following deficits and impairments:  Abnormal gait, Difficulty walking, Obesity, Decreased activity tolerance, Pain, Decreased balance, Decreased strength, Postural dysfunction  Visit Diagnosis: Muscle weakness (generalized)  Other abnormalities of gait and mobility  Difficulty in walking, not elsewhere classified     Problem List Patient Active Problem List   Diagnosis Date Noted   Gastroesophageal reflux disease    COPD mixed type (HCC)    Hypertension 05/14/2017   Depression with anxiety 05/14/2017   Multifocal pneumonia 05/14/2017   Current smoker 05/14/2017    Ruben Gottron, SPTA 05/13/2021, 10:01 AM  [facility] [address] [place], [place], [address] Phone: [phone]   Fax:  [phone]  Name: Molly [name] MRN: [id number] Date of Birth: 1955-10-21

## 2021-05-13 NOTE — Patient Instructions (Signed)
Access Code: [email or web address] URL: [email or web address] Date: 05/13/2021 Prepared by: Sallyanne Kuster  Exercises Sit to Stand Without Arm Support - 2 x daily - 5 x weekly - 2 sets - 10 reps Seated Piriformis Stretch - 2 x daily - 5 x weekly - 1 sets - 2-3 reps - 30 secs hold Heel Walking - 2 x daily - 5 x weekly - 1 sets - 3 reps Toe Walking - 2 x daily - 5 x weekly - 1 sets - 3 reps Tandem Stance with Support - 1 x daily - 5 x weekly - 1 sets - 2 reps - 20 secs hold Romberg Stance with Eyes Closed - 2 x daily - 7 x weekly - 1 sets - 3 reps - 20 secs hold  Added exercise to HEP: Seated Calf Stretch with Strap - 1 x daily - 5 x weekly - 1 sets - 2-3 reps - 30 hold

## 2021-05-19 ENCOUNTER — Ambulatory Visit: Payer: Medicare Other

## 2021-05-19 DIAGNOSIS — M7702 Medial epicondylitis, left elbow: Secondary | ICD-10-CM | POA: Diagnosis not present

## 2021-05-22 ENCOUNTER — Ambulatory Visit: Payer: Medicare Other | Attending: Family Medicine

## 2021-05-22 ENCOUNTER — Other Ambulatory Visit: Payer: Self-pay

## 2021-05-22 ENCOUNTER — Encounter: Payer: Self-pay | Admitting: Neurology

## 2021-05-22 DIAGNOSIS — R262 Difficulty in walking, not elsewhere classified: Secondary | ICD-10-CM | POA: Diagnosis not present

## 2021-05-22 DIAGNOSIS — R278 Other lack of coordination: Secondary | ICD-10-CM | POA: Insufficient documentation

## 2021-05-22 DIAGNOSIS — R2689 Other abnormalities of gait and mobility: Secondary | ICD-10-CM | POA: Insufficient documentation

## 2021-05-22 DIAGNOSIS — M6281 Muscle weakness (generalized): Secondary | ICD-10-CM | POA: Insufficient documentation

## 2021-05-22 NOTE — Therapy (Signed)
[facility] [address] [place], [place], [address] Phone: [phone]   Fax:  [phone]  Physical Therapy Treatment  Patient Details  Name: Molly [name] MRN: [id number] Date of Birth: 1955-07-19 Referring Provider (PT): Clovis Riley, L.August Saucer, MD   Encounter Date: 05/22/2021   PT End of Session - 05/22/21 1200     Visit Number 18    Number of Visits 29    Date for PT Re-Evaluation 06/15/21   updated POC   Authorization Type Primary-[employer] MEDICARE, Secondary-MEDICAID [employer] ACCESS    Progress Note Due on Visit 20    PT Start Time 0930    PT Stop Time 1015    PT Time Calculation (min) 45 min    Equipment Utilized During Treatment Gait belt    Activity Tolerance Patient tolerated treatment well    Behavior During Therapy WFL for tasks assessed/performed             Past Medical History:  Diagnosis Date   Anxiety    COPD (chronic obstructive pulmonary disease) (HCC)    Heart murmur    dx at age 76   Hypertension     Past Surgical History:  Procedure Laterality Date   NECK SURGERY      There were no vitals filed for this visit.   Subjective Assessment - 05/22/21 0933     Subjective Pt states she is feeling okay since the last visit and denies any changes. Pt reports no falls.    Limitations Standing;Walking    Patient Stated Goals To get around better    Currently in Pain? No/denies                [facility] PT Assessment - 05/22/21 0935       Functional Gait  Assessment   Gait assessed  Yes    Gait Level Surface Walks 20 ft, slow speed, abnormal gait pattern, evidence for imbalance or deviates 10-15 in outside of the 12 in walkway width. Requires more than 7 sec to ambulate 20 ft.    Change in Gait Speed Able to change speed, demonstrates mild gait deviations, deviates 6-10 in outside of the 12 in walkway width, or no gait deviations, unable to achieve a major change in velocity, or uses a  change in velocity, or uses an assistive device.    Gait with Horizontal Head Turns Performs head turns smoothly with slight change in gait velocity (eg, minor disruption to smooth gait path), deviates 6-10 in outside 12 in walkway width, or uses an assistive device.    Gait with Vertical Head Turns Performs task with slight change in gait velocity (eg, minor disruption to smooth gait path), deviates 6 - 10 in outside 12 in walkway width or uses assistive device    Gait and Pivot Turn Pivot turns safely within 3 sec and stops quickly with no loss of balance.    Step Over Obstacle Is able to step over one shoe box (4.5 in total height) but must slow down and adjust steps to clear box safely. May require verbal cueing.    Gait with Narrow Base of Support Ambulates less than 4 steps heel to toe or cannot perform without assistance.    Gait with Eyes Closed Walks 20 ft, uses assistive device, slower speed, mild gait deviations, deviates 6-10 in outside 12 in walkway width. Ambulates 20 ft in less than 9 sec but greater than 7 sec.    Ambulating Backwards Walks 20 ft,  uses assistive device, slower speed, mild gait deviations, deviates 6-10 in outside 12 in walkway width.    Steps Alternating feet, must use rail.    Total Score 17                           [facility] Adult PT Treatment/Exercise - 05/22/21 0935       Transfers   Transfers Sit to Stand;Stand to Sit    Sit to Stand 6: Modified independent (Device/Increase time)    Stand to Sit 6: Modified independent (Device/Increase time)      Ambulation/Gait   Ambulation/Gait Yes    Ambulation/Gait Assistance 5: Supervision;4: Min guard    Ambulation/Gait Assistance Details In between exercises during session    Assistive device None    Gait Pattern Step-through pattern;Ataxic;Decreased stride length    Ambulation Surface Level;Indoor      Balance   Balance Assessed Yes      Timed Up and Go Test   TUG Normal TUG    Normal TUG  (seconds) 13.22   13.22 seconds w/ cane and 10.63 seconds w/o cane     Neuro Re-ed    Neuro Re-ed Details  In // pt performed 2x30" of tandem stance each BLE w/ UE support. Pt provided min guard and verbal cues for technique. In // pt performed x30" of rhomberg stanc EC. PT provided min guard. PT progressed pt to rhomberg on two pillows in corner w/ chair in front for safety. Pt performed x30" of rhomberg stance on pillow w/ EO and demonstrated minmal sway. Pt performed 2x30" of rhomberg on pillow w/ chair in front w/ EC. Pt demonstrated increased sway and required two finger single UE assist to maintain balance. PT provided min guard.      Exercises   Other Exercises  PT reviewed HEP w/ pt. Pt performed 2x30" each BLE seated priformis stretch. Pt perfromed 2x10 sit <> w/o UE from mat. PT provided verbal cues for technique. In // pt performed 2 laps of heel walking and 2 laps of toe walking. PT provided verbal cues to move in a slow and controlled manner.                     PT Education - 05/22/21 1159     Education Details PT updated and educated pt on new addition on HEP to help w/ balance on compliant surface.    Person(s) Educated Patient    Methods Explanation;Demonstration;Handout    Comprehension Verbalized understanding;Returned demonstration              PT Short Term Goals - 05/22/21 1023       PT SHORT TERM GOAL #1   Title Pt will be IND with HEP in order to indicate improved functional mobility and dec fall risk.  (Target Date: 05/22/21)    Baseline 05/22/21- PT updated pt's HEP to include rhomberg on pillow in corner w/ chair in front to further improve balance    Time 3    Period Weeks    Status Achieved    Target Date 05/22/21      PT SHORT TERM GOAL #2   Title Pt will improve FGA to >/=17/30 in order to indicate decreased fall risk.    Baseline Not attempted; unable to assess on 04/22/21 due to unsafe (wet grass) 05/22/21- 17/30    Time 4    Period Weeks     Status Achieved  Target Date 04/22/21      PT SHORT TERM GOAL #3   Title Pt will perform TUG and update goal to reflect dec fall risk.    Baseline 05/22/21- pt performed TUG 13.22 seconds w/ SPC improving from 14.97 seconds and 10.63 seconds w/o SPC improving from 11.37 seconds    Time 3    Period Weeks    Status Achieved               PT Long Term Goals - 05/01/21 1323       PT LONG TERM GOAL #1   Title Pt will be Ind in a HEP to address cordination, strength, and ambualtion deficits (Target Date: 06/15/21)    Baseline ongoing goal    Time 6    Period Weeks    Status On-going    Target Date 06/15/21      PT LONG TERM GOAL #2   Title Pt will improve FGA to >/=21/30 in order to indicate dec fall risk.    Baseline 04/17/2021 FGA 13/30    Time 6    Period Weeks    Status Revised      PT LONG TERM GOAL #3   Title Pt will ambulate x 300' over level indoor and unlevel outdoor paved surfaces with LRAD at S level in order to indicate safe community mobility.    Time 6    Period Weeks    Status On-going      PT LONG TERM GOAL #4   Title Pt will improve 5TSS to </=11.5 secs without UE support in order to indicate improved functional strength.    Baseline 04/17/2021 14.6 sec without UE support    Time 6    Period Weeks    Status On-going                   Plan - 05/22/21 1201     Clinical Impression Statement Today's skilled PT session focused on checking pt's STG. Pt scored a 17/30 on FGA demonstrating a lower fall risk but pt is still denoted as a high fall risk due to this score. Pt was able to peform the TUG w/ a cane in 13.22 seconds and in 10.63 seconds w/o a cane also demonstrating a decreased fall risk. Pt still demonstrates deficits in balance specifically w/ smaller base of support, on compliant surfaces, and w/ eyes closed. PT updated and educated pt on new addition to HEP to improve pt's balance on complaint surface and w/ EC. Pt tolerated today's  session well w/ no adverse s/s and appropriate fatigue and soreness at the conclusion of treatment. PT will continue to progress pt as tolerated w/ activities towards improving balance and BLE strength toward pt achieving LTG.    Personal Factors and Comorbidities Age;Comorbidity 1;Comorbidity 2;Comorbidity 3+;Time since onset of injury/illness/exacerbation;Past/Current Experience    Comorbidities COPD, HTN, anxiety    Examination-Activity Limitations Locomotion Level;Squat;Stand;Stairs;Transfers    Stability/Clinical Decision Making Evolving/Moderate complexity    Rehab Potential Good    PT Frequency 2x / week    PT Duration 6 weeks    PT Treatment/Interventions ADLs/Self Care Home Management;Moist Heat;Gait training;Stair training;Functional mobility training;Therapeutic activities;Therapeutic exercise;Balance training;Neuromuscular re-education;Patient/family education;Manual techniques;Passive range of motion;Taping;Dry needling    PT Next Visit Plan Continue with gait training with quad tip cane, obstacles, stairs, curb/ramp with quad tip cane, LE strength, and dynamic/static balance on compliant surfaces balance.    PT Home Exercise Plan [id number]; Access Code [email or web address]    Consulted  and Agree with Plan of Care Patient             Patient will benefit from skilled therapeutic intervention in order to improve the following deficits and impairments:  Abnormal gait, Difficulty walking, Obesity, Decreased activity tolerance, Pain, Decreased balance, Decreased strength, Postural dysfunction  Visit Diagnosis: Muscle weakness (generalized)  Other abnormalities of gait and mobility  Difficulty in walking, not elsewhere classified     Problem List Patient Active Problem List   Diagnosis Date Noted   Gastroesophageal reflux disease    COPD mixed type (HCC)    Hypertension 05/14/2017   Depression with anxiety 05/14/2017   Multifocal pneumonia 05/14/2017   Current smoker 05/14/2017     Margie Ege, Student-PT 05/22/2021, 12:06 PM  [facility] [address] [place], [place], [address] Phone: [phone]   Fax:  [phone]  Name: Molly [name] MRN: [id number] Date of Birth: September 05, 1955

## 2021-05-22 NOTE — Patient Instructions (Signed)
Access Code: [email or web address] URL: [email or web address] Date: 05/22/2021 Prepared by: Elmer Bales  Exercises Sit to Stand Without Arm Support - 2 x daily - 5 x weekly - 2 sets - 10 reps Seated Piriformis Stretch - 2 x daily - 5 x weekly - 1 sets - 2-3 reps - 30 secs hold Heel Walking - 2 x daily - 5 x weekly - 1 sets - 3 reps Toe Walking - 2 x daily - 5 x weekly - 1 sets - 3 reps Tandem Stance with Support - 1 x daily - 5 x weekly - 1 sets - 2 reps - 20 secs hold Seated Calf Stretch with Strap - 1 x daily - 5 x weekly - 1 sets - 2-3 reps - 30 hold Romberg Stance Eyes Closed on Foam Pad - 1 x daily - 5 x weekly - 1 sets - 3 reps - 30 sec hold

## 2021-05-23 ENCOUNTER — Ambulatory Visit: Payer: Medicare Other | Admitting: Physical Therapy

## 2021-05-23 ENCOUNTER — Encounter: Payer: Self-pay | Admitting: Physical Therapy

## 2021-05-23 DIAGNOSIS — R2689 Other abnormalities of gait and mobility: Secondary | ICD-10-CM | POA: Diagnosis not present

## 2021-05-23 DIAGNOSIS — R278 Other lack of coordination: Secondary | ICD-10-CM | POA: Diagnosis not present

## 2021-05-23 DIAGNOSIS — R262 Difficulty in walking, not elsewhere classified: Secondary | ICD-10-CM

## 2021-05-23 DIAGNOSIS — M6281 Muscle weakness (generalized): Secondary | ICD-10-CM | POA: Diagnosis not present

## 2021-05-23 NOTE — Therapy (Signed)
[facility] [address] [place], [place], [address] Phone: [phone]   Fax:  [phone]  Physical Therapy Treatment  Patient Details  Name: Molly [name] MRN: [id number] Date of Birth: [birth year] Referring Provider (PT): Clovis Riley, L.August Saucer, MD   Encounter Date: 05/23/2021   PT End of Session - 05/23/21 1209     Visit Number 19    Number of Visits 29    Date for PT Re-Evaluation 06/15/21   updated POC   Authorization Type Primary-[employer] MEDICARE, Secondary-MEDICAID [employer] ACCESS    Progress Note Due on Visit 20    PT Start Time 0932    PT Stop Time 1015    PT Time Calculation (min) 43 min    Equipment Utilized During Treatment Gait belt    Activity Tolerance Patient tolerated treatment well;Patient limited by pain    Behavior During Therapy WFL for tasks assessed/performed             Past Medical History:  Diagnosis Date   Anxiety    COPD (chronic obstructive pulmonary disease) (HCC)    Heart murmur    dx at age 21   Hypertension     Past Surgical History:  Procedure Laterality Date   NECK SURGERY      There were no vitals filed for this visit.   Subjective Assessment - 05/23/21 0933     Subjective No few complaints, falls, or pain today."My legs are just feeling a little tired from yesterday.    Limitations Standing;Walking    Patient Stated Goals To get around better    Currently in Pain? No/denies    Pain Score 0-No pain               [facility] Adult PT Treatment/Exercise - 05/23/21 0934       Transfers   Transfers Sit to Stand;Stand to Sit    Sit to Stand 6: Modified independent (Device/Increase time)    Stand to Sit 6: Modified independent (Device/Increase time)      Ambulation/Gait   Ambulation/Gait Yes    Ambulation/Gait Assistance 5: Supervision    Ambulation Distance (Feet) --   throughout session   Assistive device None    Gait Pattern Step-through  pattern;Ataxic;Decreased stride length    Ambulation Surface Level;Indoor      Self-Care   Self-Care Other Self-Care Comments    Other Self-Care Comments  foam rolling of plantar surface of B feet x 2 min. Educated pt about performing at home with frozen water bottle as needed for cramping of feet.      Knee/Hip Exercises: Stretches   Theme park manager Both;1 rep;20 seconds    Gastroc Stretch Limitations Performed standing calf stretch at bars x 20 sec each side      Knee/Hip Exercises: Seated   Clamshell with TheraBand Red   x 10 with cues for technique   Marching Strengthening;Both;1 set;10 reps;Limitations    Marching Limitations marching with red theraband around thighs x 10 each side. Pt required cues for technique.               Balance Exercises - 05/23/21 0944       Balance Exercises: Standing   Standing Eyes Opened Narrow base of support (BOS);Foam/compliant surface;1 rep;30 secs;Limitations    Standing Eyes Opened Limitations Pt performed standing with EO and narrow BOS on airex in // bars. The pt demonstrated postural sway in all directions, but was able to maintain balance with min guard and intermittent  UE support.    Standing Eyes Closed Narrow base of support (BOS);Foam/compliant surface;3 reps;30 secs;Limitations    Standing Eyes Closed Limitations Pt performed in // bars on airex with narrow BOS and EC for 3 x 30 sec's. min guard/assist and 2 finger UE assist. The pt demonstrated significant postural sway and frequent LOB in all directions.    Rockerboard Anterior/posterior;Lateral;EO;EC;Other time (comment);Other reps (comment)    Rockerboard Limitations worked on [employer] in both directions with EO then progressing to EC. The pt demonstrated lateral hip instability in both LE's when performing lateral rocking, requiring cues to engage glutes. The pt then progressed maintaining A/P and lateral board steady 3 x 30 seconds with EC and 2 finger support. The pt  required min guard/assist to maintain balance. During balance exercises, the pt c/o pain/cramping in B feet and calves. Pt transitioned to stretching/foam rolling to relive cramping/pain.    Sit to Stand Foam/compliant surface;Limitations    Sit to Stand Limitations Pt performed sit<>stands x 10 from standard height table with LE's on airex and no UE support. A chair was placed in front of pt for safety, however pt was able to perform with min guard and no UE support. Pt demonstrated occaisional LOB, however was able to self correct. The pt required cues for slow/controlled movements.    Other Standing Exercises Standing at steps on airex using two finger support from 1 UE: The pt performed 10 alternating toe taps on first step, then 10 alternating toe taps up first and second step, then stepping back down. The pt also demonstrated lateral pelvic instability on the stance leg when tapping with B LE's, requiring frequent cues for glute activation.                PT Education - 05/23/21 1208     Education Details Educated on using frozen water bottle at home to roll on plantar surface of feet to assist with pain/cramping.    Person(s) Educated Patient    Methods Explanation;Demonstration    Comprehension Verbalized understanding              PT Short Term Goals - 05/22/21 1023       PT SHORT TERM GOAL #1   Title Pt will be IND with HEP in order to indicate improved functional mobility and dec fall risk.  (Target Date: 05/22/21)    Baseline 05/22/21- PT updated pt's HEP to include rhomberg on pillow in corner w/ chair in front to further improve balance    Time 3    Period Weeks    Status Achieved    Target Date 05/22/21      PT SHORT TERM GOAL #2   Title Pt will improve FGA to >/=17/30 in order to indicate decreased fall risk.    Baseline Not attempted; unable to assess on 04/22/21 due to unsafe (wet grass) 05/22/21- 17/30    Time 4    Period Weeks    Status Achieved    Target  Date 04/22/21      PT SHORT TERM GOAL #3   Title Pt will perform TUG and update goal to reflect dec fall risk.    Baseline 05/22/21- pt performed TUG 13.22 seconds w/ SPC improving from 14.97 seconds and 10.63 seconds w/o SPC improving from 11.37 seconds    Time 3    Period Weeks    Status Achieved               PT Long Term Goals -  05/01/21 1323       PT LONG TERM GOAL #1   Title Pt will be Ind in a HEP to address cordination, strength, and ambualtion deficits (Target Date: 06/15/21)    Baseline ongoing goal    Time 6    Period Weeks    Status On-going    Target Date 06/15/21      PT LONG TERM GOAL #2   Title Pt will improve FGA to >/=21/30 in order to indicate dec fall risk.    Baseline 04/17/2021 FGA 13/30    Time 6    Period Weeks    Status Revised      PT LONG TERM GOAL #3   Title Pt will ambulate x 300' over level indoor and unlevel outdoor paved surfaces with LRAD at S level in order to indicate safe community mobility.    Time 6    Period Weeks    Status On-going      PT LONG TERM GOAL #4   Title Pt will improve 5TSS to </=11.5 secs without UE support in order to indicate improved functional strength.    Baseline 04/17/2021 14.6 sec without UE support    Time 6    Period Weeks    Status On-going               Plan - 05/23/21 1210     Clinical Impression Statement Today's skilled session was focused on static/dynamic exercises on compliant surfaces and LE strengthening/stretching exercises. The pt performed interventions with rest breaks as needed, and responded well to foam rolling of B feet to manage cramping. The pt could continue to benefit from further skilled PT to address functional deficits.    Personal Factors and Comorbidities Age;Comorbidity 1;Comorbidity 2;Comorbidity 3+;Time since onset of injury/illness/exacerbation;Past/Current Experience    Comorbidities COPD, HTN, anxiety    Examination-Activity Limitations Locomotion  Level;Squat;Stand;Stairs;Transfers    Stability/Clinical Decision Making Evolving/Moderate complexity    Rehab Potential Good    PT Frequency 2x / week    PT Duration 6 weeks    PT Treatment/Interventions ADLs/Self Care Home Management;Moist Heat;Gait training;Stair training;Functional mobility training;Therapeutic activities;Therapeutic exercise;Balance training;Neuromuscular re-education;Patient/family education;Manual techniques;Passive range of motion;Taping;Dry needling    PT Next Visit Plan Continue with gait training with quad tip cane, obstacles, stairs, curb/ramp with quad tip cane, LE/hip strength, and dynamic/static balance on compliant surfaces with narrow BOS and EO/EC.    PT Home Exercise Plan [phone]; Access Code [email or web address]    Consulted and Agree with Plan of Care Patient             Patient will benefit from skilled therapeutic intervention in order to improve the following deficits and impairments:  Abnormal gait, Difficulty walking, Obesity, Decreased activity tolerance, Pain, Decreased balance, Decreased strength, Postural dysfunction  Visit Diagnosis: Muscle weakness (generalized)  Other abnormalities of gait and mobility  Difficulty in walking, not elsewhere classified     Problem List Patient Active Problem List   Diagnosis Date Noted   Gastroesophageal reflux disease    COPD mixed type (HCC)    Hypertension 05/14/2017   Depression with anxiety 05/14/2017   Multifocal pneumonia 05/14/2017   Current smoker 05/14/2017    Ruben Gottron, SPTA 05/23/2021, 12:19 PM  [facility] [address] [place], [place], [address] Phone: [phone]   Fax:  [phone]  Name: Molly [name] MRN: [id number] Date of Birth: 24-Mar-1956

## 2021-05-27 ENCOUNTER — Ambulatory Visit: Payer: Medicare Other | Admitting: Rehabilitation

## 2021-05-30 ENCOUNTER — Encounter: Payer: Self-pay | Admitting: Physical Therapy

## 2021-05-30 ENCOUNTER — Other Ambulatory Visit: Payer: Self-pay

## 2021-05-30 ENCOUNTER — Ambulatory Visit: Payer: Medicare Other | Admitting: Physical Therapy

## 2021-05-30 DIAGNOSIS — R262 Difficulty in walking, not elsewhere classified: Secondary | ICD-10-CM

## 2021-05-30 DIAGNOSIS — R2689 Other abnormalities of gait and mobility: Secondary | ICD-10-CM | POA: Diagnosis not present

## 2021-05-30 DIAGNOSIS — R278 Other lack of coordination: Secondary | ICD-10-CM | POA: Diagnosis not present

## 2021-05-30 DIAGNOSIS — M6281 Muscle weakness (generalized): Secondary | ICD-10-CM | POA: Diagnosis not present

## 2021-05-30 NOTE — Therapy (Addendum)
[facility] [address] [place], [place], [address] Phone: [phone]   Fax:  [phone]  Physical Therapy Treatment/Progress note  Patient Details  Name: Molly [name] MRN: [id number] Date of Birth: 1956/01/03 Referring Provider (PT): Clovis Riley, L.August Saucer, MD   Encounter Date: 05/30/2021   PT End of Session - 05/30/21 0934     Visit Number 20    Number of Visits 29    Date for PT Re-Evaluation 06/15/21   updated POC   Authorization Type Primary-[employer] MEDICARE, Secondary-MEDICAID [place] ACCESS    Progress Note Due on Visit 30    PT Start Time 0932    PT Stop Time 1014    PT Time Calculation (min) 42 min    Equipment Utilized During Treatment Gait belt    Activity Tolerance Patient tolerated treatment well;Patient limited by pain    Behavior During Therapy WFL for tasks assessed/performed             Past Medical History:  Diagnosis Date   Anxiety    COPD (chronic obstructive pulmonary disease) (HCC)    Heart murmur    dx at age 36   Hypertension     Past Surgical History:  Procedure Laterality Date   NECK SURGERY      There were no vitals filed for this visit.   Subjective Assessment - 05/30/21 0934     Subjective No new complaints. No falls or pain to report.    Limitations Standing;Walking    Patient Stated Goals To get around better    Currently in Pain? No/denies    Pain Score 0-No pain                    [facility] Adult PT Treatment/Exercise - 05/30/21 0936       Transfers   Transfers Sit to Stand;Stand to Sit    Sit to Stand 6: Modified independent (Device/Increase time)    Five time sit to stand comments  10.03 sec's with no UE support from standard height chair.    Stand to Sit 6: Modified independent (Device/Increase time)      Ambulation/Gait   Ambulation/Gait Yes    Ambulation/Gait Assistance 5: Supervision;4: Min guard    Ambulation Distance (Feet) --   around clinic  with session   Assistive device Straight cane   with rubber quad tip   Gait Pattern Step-through pattern;Ataxic;Decreased stride length    Ambulation Surface Level;Indoor      Neuro Re-ed    Neuro Re-ed Details  for balance/muscle re-ed: gait around track with cane while scanning all directions randomly with min guard assist for safety with veering/staggering noted at times. cues to keep cane out to side so not ot step on it with right LE.      Knee/Hip Exercises: Aerobic   Other Aerobic Scifit UE/LE's level 3.5 x 6 minutes with goal >/= 70 steps per minute for strengthening and activity tolerance.                 Balance Exercises - 05/30/21 0951       Balance Exercises: Standing   Standing Eyes Closed Narrow base of support (BOS);Wide (BOA);Foam/compliant surface;Head turns;Other reps (comment);30 secs;Limitations    Standing Eyes Closed Limitations on airex with no UE support, min guard to min assist for balance for the following: feet together for EC 30 sec's x 3 reps, progressing to feet hip width apart for EC head movements left<>right and up<>down for ~  10 reps each. min guard to min assist for balance with cues on weight shifting to assist with balance.    Balance Beam standing across blue foam beam with light to no UE support on bars: alternating stepping forward to floor/back onto beam, then alternating backward stepping to floor/back onto beam for ~10 reps each. cues for increased step length/height and weight shifitng.    Tandem Gait Forward;Retro;Upper extremity support;Foam/compliant surface;3 reps;Limitations    Tandem Gait Limitations on blue foam beam with light UE support for 3 laps each way with cues on posture and step placement on beam. min guard to min assist for balance.    Partial Tandem Stance Foam/compliant surface;Eyes closed;2 reps;20 secs;Limitations    Partial Tandem Stance Limitations on airex with no UE support    Sidestepping Foam/compliant support;Upper  extremity support;3 reps;Limitations    Sidestepping Limitations on blue foam beam with light UE support for 3 laps each way with cues on posture, step length and to lift foot each time vs sliding it on beam. min guard to min assist for balance.                  PT Short Term Goals - 05/22/21 1023       PT SHORT TERM GOAL #1   Title Pt will be IND with HEP in order to indicate improved functional mobility and dec fall risk.  (Target Date: 05/22/21)    Baseline 05/22/21- PT updated pt's HEP to include rhomberg on pillow in corner w/ chair in front to further improve balance    Time 3    Period Weeks    Status Achieved    Target Date 05/22/21      PT SHORT TERM GOAL #2   Title Pt will improve FGA to >/=17/30 in order to indicate decreased fall risk.    Baseline Not attempted; unable to assess on 04/22/21 due to unsafe (wet grass) 05/22/21- 17/30    Time 4    Period Weeks    Status Achieved    Target Date 04/22/21      PT SHORT TERM GOAL #3   Title Pt will perform TUG and update goal to reflect dec fall risk.    Baseline 05/22/21- pt performed TUG 13.22 seconds w/ SPC improving from 14.97 seconds and 10.63 seconds w/o SPC improving from 11.37 seconds    Time 3    Period Weeks    Status Achieved               PT Long Term Goals - 05/30/21 1131       PT LONG TERM GOAL #1   Title Pt will be Ind in a HEP to address cordination, strength, and ambualtion deficits (Target Date: 06/15/21)    Baseline ongoing goal    Time 6    Period Weeks    Status On-going    Target Date 06/15/21      PT LONG TERM GOAL #2   Title Pt will improve FGA to >/=21/30 in order to indicate dec fall risk.    Baseline 04/17/2021 FGA 13/30    Time 6    Period Weeks    Status On-going      PT LONG TERM GOAL #3   Title Pt will ambulate x 300' over level indoor and unlevel outdoor paved surfaces with LRAD at S level in order to indicate safe community mobility.    Time 6    Period Weeks     Status On-going  PT LONG TERM GOAL #4   Title Pt will improve 5TSS to </=11.5 secs without UE support in order to indicate improved functional strength.    Baseline 05/30/21: 10.03 sec's no UE support from standard height surface    Time --    Period --    Status Achieved                   Plan - 05/30/21 0934     Clinical Impression Statement Today's skilled session continued to focus on strengthening and balance training with no issues noted. Recheck pt's 5 time sit to stand with pt improving to 10.03 sec's no UE support from standard height surface, meeting her LTG. The pt is making steady progress and should benefit from continued PT to progress toward unmet goals.    Personal Factors and Comorbidities Age;Comorbidity 1;Comorbidity 2;Comorbidity 3+;Time since onset of injury/illness/exacerbation;Past/Current Experience    Comorbidities COPD, HTN, anxiety    Examination-Activity Limitations Locomotion Level;Squat;Stand;Stairs;Transfers    Stability/Clinical Decision Making Evolving/Moderate complexity    Rehab Potential Good    PT Frequency 2x / week    PT Duration 6 weeks    PT Treatment/Interventions ADLs/Self Care Home Management;Moist Heat;Gait training;Stair training;Functional mobility training;Therapeutic activities;Therapeutic exercise;Balance training;Neuromuscular re-education;Patient/family education;Manual techniques;Passive range of motion;Taping;Dry needling    PT Next Visit Plan Continue with gait training with quad tip cane, obstacles, stairs, curb/ramp with quad tip cane, LE/hip strength, and dynamic/static balance on compliant surfaces with narrow BOS and EO/EC.    PT Home Exercise Plan [id number]; Access Code [email or web address]    Consulted and Agree with Plan of Care Patient             Patient will benefit from skilled therapeutic intervention in order to improve the following deficits and impairments:  Abnormal gait, Difficulty walking, Obesity, Decreased  activity tolerance, Pain, Decreased balance, Decreased strength, Postural dysfunction  Visit Diagnosis: Muscle weakness (generalized)  Other abnormalities of gait and mobility  Difficulty in walking, not elsewhere classified     Problem List Patient Active Problem List   Diagnosis Date Noted   Gastroesophageal reflux disease    COPD mixed type (HCC)    Hypertension 05/14/2017   Depression with anxiety 05/14/2017   Multifocal pneumonia 05/14/2017   Current smoker 05/14/2017    Progress Note  Reporting period 04/17/21 to 05/30/21  See Note above for Objective Data and Assessment of Progress/Goals   Sallyanne Kuster, PTA, [facility] [address], [address] [place], [place] [address] [phone] 05/30/21, 11:37 AM  Elmer Bales, PT, DPT, NCS   Name: Molly [name] MRN: [id number] Date of Birth: 1956/06/08

## 2021-06-02 ENCOUNTER — Other Ambulatory Visit: Payer: Self-pay

## 2021-06-02 ENCOUNTER — Ambulatory Visit: Payer: Medicare Other

## 2021-06-02 DIAGNOSIS — R262 Difficulty in walking, not elsewhere classified: Secondary | ICD-10-CM

## 2021-06-02 DIAGNOSIS — R278 Other lack of coordination: Secondary | ICD-10-CM | POA: Diagnosis not present

## 2021-06-02 DIAGNOSIS — R2689 Other abnormalities of gait and mobility: Secondary | ICD-10-CM

## 2021-06-02 DIAGNOSIS — M6281 Muscle weakness (generalized): Secondary | ICD-10-CM

## 2021-06-02 NOTE — Therapy (Signed)
[facility] [address] [place], [place], [address] Phone: [phone]   Fax:  [phone]  Physical Therapy Treatment  Patient Details  Name: Molly [name] MRN: [id number] Date of Birth: Jul 13, 1955 Referring Provider (PT): Clovis Riley, L.August Saucer, MD   Encounter Date: 06/02/2021   PT End of Session - 06/02/21 1032     Visit Number 21    Number of Visits 29    Date for PT Re-Evaluation 06/15/21   updated POC   Authorization Type Primary-[employer]HC MEDICARE, Secondary-MEDICAID [place] ACCESS    Progress Note Due on Visit 30    PT Start Time 0932    PT Stop Time 1015    PT Time Calculation (min) 43 min    Equipment Utilized During Treatment Gait belt    Activity Tolerance Patient tolerated treatment well;Patient limited by pain    Behavior During Therapy WFL for tasks assessed/performed             Past Medical History:  Diagnosis Date   Anxiety    COPD (chronic obstructive pulmonary disease) (HCC)    Heart murmur    dx at age 57   Hypertension     Past Surgical History:  Procedure Laterality Date   NECK SURGERY      There were no vitals filed for this visit.   Subjective Assessment - 06/02/21 0934     Subjective Pt reports no falls and no new complaints,.    Limitations Standing;Walking    Patient Stated Goals To get around better    Currently in Pain? No/denies                               [facility] Adult PT Treatment/Exercise - 06/02/21 0934       Transfers   Transfers Sit to Stand;Stand to Sit    Sit to Stand 6: Modified independent (Device/Increase time)    Stand to Sit 6: Modified independent (Device/Increase time)      Ambulation/Gait   Ambulation/Gait Yes    Ambulation/Gait Assistance 5: Supervision;4: Min guard   Needed reminders throughout session to use cane   Ambulation/Gait Assistance Details around clinic between activities    Assistive device Straight cane   w/  rubber quad tip   Gait Pattern Step-through pattern;Ataxic;Decreased stride length    Ambulation Surface Level;Indoor      Neuro Re-ed    Neuro Re-ed Details  Pt performed 3 laps of obstacle course w/ reciprocal gait over 4 hurdles on blue mat and 4 cone slalom w/ use of quad tip SPC. Pt had several loss of balances requiring CGA/min assist from PT to correct. PT provided verbal cues to maintain cane on the ground to assist w/ balance when stepping over hurdles. In //: pt performed steps towards target while standing on balance beam 3x10 each BLE. PT cued pt to maintain upright posture and to squeeze glutes for balance. Pt required intermittent single UE assist and pt min guard for balance. Pt performed 3x10 step ups on 4inch step w/ airex on top LLE up and RLE down. Pt required RUE assist for balance and verbal cues for technqiue. PT provided min guard. Pt performed 3x30 seconds EC rhomberg on airex. Pt demonstrates increased sway requiring min guard/CGA from PT to maintain balance. Pt performed 2x10 HT w/ EC vertical/horizontal each on airex. Pt demonstrated slight sway. PT provided min guard. Pt performed 2x30 seconds tandem stance on airex.  Pt required intermittent UE assist to maintain balance. PT provided min guard/CGA for safety.      Knee/Hip Exercises: Aerobic   Other Aerobic Scifit UE/LE's level 4.0 x 6 minutes with goal >/= 70 steps per minute for strengthening and activity tolerance.                       PT Short Term Goals - 05/22/21 1023       PT SHORT TERM GOAL #1   Title Pt will be IND with HEP in order to indicate improved functional mobility and dec fall risk.  (Target Date: 05/22/21)    Baseline 05/22/21- PT updated pt's HEP to include rhomberg on pillow in corner w/ chair in front to further improve balance    Time 3    Period Weeks    Status Achieved    Target Date 05/22/21      PT SHORT TERM GOAL #2   Title Pt will improve FGA to >/=17/30 in order to indicate  decreased fall risk.    Baseline Not attempted; unable to assess on 04/22/21 due to unsafe (wet grass) 05/22/21- 17/30    Time 4    Period Weeks    Status Achieved    Target Date 04/22/21      PT SHORT TERM GOAL #3   Title Pt will perform TUG and update goal to reflect dec fall risk.    Baseline 05/22/21- pt performed TUG 13.22 seconds w/ SPC improving from 14.97 seconds and 10.63 seconds w/o SPC improving from 11.37 seconds    Time 3    Period Weeks    Status Achieved               PT Long Term Goals - 05/30/21 1131       PT LONG TERM GOAL #1   Title Pt will be Ind in a HEP to address cordination, strength, and ambualtion deficits (Target Date: 06/15/21)    Baseline ongoing goal    Time 6    Period Weeks    Status On-going    Target Date 06/15/21      PT LONG TERM GOAL #2   Title Pt will improve FGA to >/=21/30 in order to indicate dec fall risk.    Baseline 04/17/2021 FGA 13/30    Time 6    Period Weeks    Status On-going      PT LONG TERM GOAL #3   Title Pt will ambulate x 300' over level indoor and unlevel outdoor paved surfaces with LRAD at S level in order to indicate safe community mobility.    Time 6    Period Weeks    Status On-going      PT LONG TERM GOAL #4   Title Pt will improve 5TSS to </=11.5 secs without UE support in order to indicate improved functional strength.    Baseline 05/30/21: 10.03 sec's no UE support from standard height surface    Time --    Period --    Status Achieved                   Plan - 06/02/21 1034     Clinical Impression Statement Today's session focused on continued strengthening, gait, and balance training. Pt continues to present w/ deficits in balance on compliant surfaces, w/ EC, and w/ narrow base of support. PT will continue to progress pt as tolerated w/ POC towards pt achievement of LTG.  Personal Factors and Comorbidities Age;Comorbidity 1;Comorbidity 2;Comorbidity 3+;Time since onset of  injury/illness/exacerbation;Past/Current Experience    Comorbidities COPD, HTN, anxiety    Examination-Activity Limitations Locomotion Level;Squat;Stand;Stairs;Transfers    Stability/Clinical Decision Making Evolving/Moderate complexity    Rehab Potential Good    PT Frequency 2x / week    PT Duration 6 weeks    PT Treatment/Interventions ADLs/Self Care Home Management;Moist Heat;Gait training;Stair training;Functional mobility training;Therapeutic activities;Therapeutic exercise;Balance training;Neuromuscular re-education;Patient/family education;Manual techniques;Passive range of motion;Taping;Dry needling    PT Next Visit Plan Continue with gait training with quad tip cane, obstacles, stairs, curb/ramp with quad tip cane, LE/hip strength, and dynamic/static balance on compliant surfaces with narrow BOS and EO/EC.    PT Home Exercise Plan [phone]; Access Code [email or web address]    Consulted and Agree with Plan of Care Patient             Patient will benefit from skilled therapeutic intervention in order to improve the following deficits and impairments:  Abnormal gait, Difficulty walking, Obesity, Decreased activity tolerance, Pain, Decreased balance, Decreased strength, Postural dysfunction  Visit Diagnosis: Muscle weakness (generalized)  Other abnormalities of gait and mobility  Difficulty in walking, not elsewhere classified     Problem List Patient Active Problem List   Diagnosis Date Noted   Gastroesophageal reflux disease    COPD mixed type (HCC)    Hypertension 05/14/2017   Depression with anxiety 05/14/2017   Multifocal pneumonia 05/14/2017   Current smoker 05/14/2017    Margie Ege, Student-PT 06/02/2021, 10:37 AM  [facility] [address] [place], [place], [address] Phone: [phone]   Fax:  [phone]  Name: SHAKETTA RILL MRN: [id number] Date of Birth: Sep 10, 1955

## 2021-06-06 ENCOUNTER — Ambulatory Visit: Payer: Medicare Other

## 2021-06-06 ENCOUNTER — Other Ambulatory Visit: Payer: Self-pay

## 2021-06-06 DIAGNOSIS — R262 Difficulty in walking, not elsewhere classified: Secondary | ICD-10-CM | POA: Diagnosis not present

## 2021-06-06 DIAGNOSIS — R278 Other lack of coordination: Secondary | ICD-10-CM | POA: Diagnosis not present

## 2021-06-06 DIAGNOSIS — M6281 Muscle weakness (generalized): Secondary | ICD-10-CM

## 2021-06-06 DIAGNOSIS — R2689 Other abnormalities of gait and mobility: Secondary | ICD-10-CM

## 2021-06-06 NOTE — Therapy (Signed)
[facility] [address] [place], [place], [address] Phone: [phone]   Fax:  [phone]  Physical Therapy Treatment  Patient Details  Name: Molly [name] MRN: [id number] Date of Birth: [birth year] Referring Provider (PT): Clovis Riley, L.August Saucer, MD   Encounter Date: 06/06/2021   PT End of Session - 06/06/21 0935     Visit Number 22    Number of Visits 29    Date for PT Re-Evaluation 06/15/21   updated POC   Authorization Type Primary-[employer] MEDICARE, Secondary-MEDICAID [place] ACCESS    Progress Note Due on Visit 30    PT Start Time 0932    PT Stop Time 1013    PT Time Calculation (min) 41 min    Equipment Utilized During Treatment Gait belt    Activity Tolerance Patient tolerated treatment well;Patient limited by pain    Behavior During Therapy WFL for tasks assessed/performed             Past Medical History:  Diagnosis Date   Anxiety    COPD (chronic obstructive pulmonary disease) (HCC)    Heart murmur    dx at age 65   Hypertension     Past Surgical History:  Procedure Laterality Date   NECK SURGERY      There were no vitals filed for this visit.   Subjective Assessment - 06/06/21 0935     Subjective Denies new changes/complaints. No falls. No pain. I like this cane (SPC w/ quad tip) better    Limitations Standing;Walking    Patient Stated Goals To get around better    Currently in Pain? No/denies               [facility] Adult PT Treatment/Exercise - 06/06/21 0001       Ambulation/Gait   Ambulation/Gait Yes    Ambulation/Gait Assistance 5: Supervision;4: Min guard    Assistive device Straight cane   w/ quad tip   Gait Pattern Step-through pattern;Ataxic;Decreased stride length    Ambulation Surface Level;Indoor      Neuro Re-ed    Neuro Re-ed Details  Completed bending activites working on reaching for obstacle on ground and coming back up to upright standing, completed x 10 reps  working on picking up cones. pt initially going into deep squat and unable to return to standing withotu assistance. PT educatingon proper form/technique and patient able to return demo w/ improved ability      Exercises   Exercises Knee/Hip;Other Exercises    Other Exercises  With light UE support at chair: completed squats and return to upright, cues to sit back vs squat forwad, 2 x 10 reps.      Knee/Hip Exercises: Aerobic   Other Aerobic Scifit with BUE/BLE's level 4.0 x 8 minutes with goal >/= 70 steps per minute for strengthening and activity tolerance. Pt tolerating increased time well.      Knee/Hip Exercises: Standing   Heel Raises Both;10 reps;Limitations;2 sets    Heel Raises Limitations w/ light UE support from chair    Forward Step Up Both;1 set;15 reps;Hand Hold: 1;Step Height: 6"    Forward Step Up Limitations slow progression of reducing UE support from BUE > single UE. stepping back down onto airex for further balance challenge              Balance Exercises - 06/06/21 0001       Balance Exercises: Standing   Standing Eyes Opened Wide (BOA);Narrow base of support (BOS);Head turns;Foam/compliant surface;Limitations  Standing Eyes Opened Limitations EO with horizontal/vertical head turns x 10 reps each. increased sway noted required CGA from PT    Standing Eyes Closed Wide (BOA);Narrow base of support (BOS);Foam/compliant surface;3 reps;30 secs;Limitations    Standing Eyes Closed Limitations on airex progressing from wide BOS to more narrow BOS.    SLS with Vectors Foam/compliant surface;Intermittent upper extremity assist;Limitations    SLS with Vectors Limitations alternating toe taps to 1st step standing on airex, x 10 reps bilat. cues to stand tall to promote improved SLS. CGA and intermittent UE support                  PT Short Term Goals - 05/22/21 1023       PT SHORT TERM GOAL #1   Title Pt will be IND with HEP in order to indicate improved  functional mobility and dec fall risk.  (Target Date: 05/22/21)    Baseline 05/22/21- PT updated pt's HEP to include rhomberg on pillow in corner w/ chair in front to further improve balance    Time 3    Period Weeks    Status Achieved    Target Date 05/22/21      PT SHORT TERM GOAL #2   Title Pt will improve FGA to >/=17/30 in order to indicate decreased fall risk.    Baseline Not attempted; unable to assess on 04/22/21 due to unsafe (wet grass) 05/22/21- 17/30    Time 4    Period Weeks    Status Achieved    Target Date 04/22/21      PT SHORT TERM GOAL #3   Title Pt will perform TUG and update goal to reflect dec fall risk.    Baseline 05/22/21- pt performed TUG 13.22 seconds w/ SPC improving from 14.97 seconds and 10.63 seconds w/o SPC improving from 11.37 seconds    Time 3    Period Weeks    Status Achieved               PT Long Term Goals - 05/30/21 1131       PT LONG TERM GOAL #1   Title Pt will be Ind in a HEP to address cordination, strength, and ambualtion deficits (Target Date: 06/15/21)    Baseline ongoing goal    Time 6    Period Weeks    Status On-going    Target Date 06/15/21      PT LONG TERM GOAL #2   Title Pt will improve FGA to >/=21/30 in order to indicate dec fall risk.    Baseline 04/17/2021 FGA 13/30    Time 6    Period Weeks    Status On-going      PT LONG TERM GOAL #3   Title Pt will ambulate x 300' over level indoor and unlevel outdoor paved surfaces with LRAD at S level in order to indicate safe community mobility.    Time 6    Period Weeks    Status On-going      PT LONG TERM GOAL #4   Title Pt will improve 5TSS to </=11.5 secs without UE support in order to indicate improved functional strength.    Baseline 05/30/21: 10.03 sec's no UE support from standard height surface    Time --    Period --    Status Achieved                   Plan - 06/06/21 0942     Clinical Impression Statement Continued  focus on balance training and  functional strengthening. Pt reporting increased challenge with bending activities therefore focused on this during session to promote improved safety within the home. Pt tolerating activites well today, will continue per POC.    Personal Factors and Comorbidities Age;Comorbidity 1;Comorbidity 2;Comorbidity 3+;Time since onset of injury/illness/exacerbation;Past/Current Experience    Comorbidities COPD, HTN, anxiety    Examination-Activity Limitations Locomotion Level;Squat;Stand;Stairs;Transfers    Stability/Clinical Decision Making Evolving/Moderate complexity    Rehab Potential Good    PT Frequency 2x / week    PT Duration 6 weeks    PT Treatment/Interventions ADLs/Self Care Home Management;Moist Heat;Gait training;Stair training;Functional mobility training;Therapeutic activities;Therapeutic exercise;Balance training;Neuromuscular re-education;Patient/family education;Manual techniques;Passive range of motion;Taping;Dry needling    PT Next Visit Plan Will need re-cert next week to cover remaining visits. Continue with gait training with quad tip cane, obstacles, stairs, curb/ramp with quad tip cane, LE/hip strength, and dynamic/static balance on compliant surfaces with narrow BOS and EO/EC.    PT Home Exercise Plan [phone]; Access Code [email or web address]    Consulted and Agree with Plan of Care Patient             Patient will benefit from skilled therapeutic intervention in order to improve the following deficits and impairments:  Abnormal gait, Difficulty walking, Obesity, Decreased activity tolerance, Pain, Decreased balance, Decreased strength, Postural dysfunction  Visit Diagnosis: Muscle weakness (generalized)  Other abnormalities of gait and mobility  Difficulty in walking, not elsewhere classified  Other lack of coordination     Problem List Patient Active Problem List   Diagnosis Date Noted   Gastroesophageal reflux disease    COPD mixed type (HCC)    Hypertension 05/14/2017    Depression with anxiety 05/14/2017   Multifocal pneumonia 05/14/2017   Current smoker 05/14/2017    Tempie Donning, PT, DPT 06/06/2021, 11:31 AM  [facility] [address] [place], [place], [address] Phone: [phone]   Fax:  [phone]  Name: ASHIKA APUZZO MRN: [id number] Date of Birth: [birth year]

## 2021-06-06 NOTE — Patient Instructions (Signed)
Access Code: [email or web address] URL: [email or web address] Date: 06/06/2021 Prepared by: Jethro Bastos  Exercises Sit to Stand Without Arm Support - 2 x daily - 5 x weekly - 2 sets - 10 reps Seated Piriformis Stretch - 2 x daily - 5 x weekly - 1 sets - 2-3 reps - 30 secs hold Heel Walking - 2 x daily - 5 x weekly - 1 sets - 3 reps Toe Walking - 2 x daily - 5 x weekly - 1 sets - 3 reps Tandem Stance with Support - 1 x daily - 5 x weekly - 1 sets - 2 reps - 20 secs hold Seated Calf Stretch with Strap - 1 x daily - 5 x weekly - 1 sets - 2-3 reps - 30 hold Romberg Stance Eyes Closed on Foam Pad - 1 x daily - 5 x weekly - 1 sets - 3 reps - 30 sec hold Mini Squat with Counter Support - 1 x daily - 5 x weekly - 2 sets - 10 reps

## 2021-06-09 ENCOUNTER — Ambulatory Visit: Payer: Medicare Other

## 2021-06-09 ENCOUNTER — Other Ambulatory Visit: Payer: Self-pay

## 2021-06-09 DIAGNOSIS — M6281 Muscle weakness (generalized): Secondary | ICD-10-CM

## 2021-06-09 DIAGNOSIS — R278 Other lack of coordination: Secondary | ICD-10-CM

## 2021-06-09 DIAGNOSIS — R262 Difficulty in walking, not elsewhere classified: Secondary | ICD-10-CM | POA: Diagnosis not present

## 2021-06-09 DIAGNOSIS — R2689 Other abnormalities of gait and mobility: Secondary | ICD-10-CM | POA: Diagnosis not present

## 2021-06-09 NOTE — Therapy (Addendum)
[facility] [address] [place], [place], [address] Phone: [phone]   Fax:  [phone]  Physical Therapy Treatment  Patient Details  Name: Molly [name] MRN: [id number] Date of Birth: 26-Jan-1956 Referring Provider (PT): Clovis Riley, L.August Saucer, MD   Encounter Date: 06/09/2021   PT End of Session - 06/09/21 1029     Visit Number 23    Number of Visits 29    Date for PT Re-Evaluation 06/15/21   updated POC   Authorization Type Primary-[employer] MEDICARE, Secondary-MEDICAID [place] ACCESS    Progress Note Due on Visit 30    PT Start Time 0931    PT Stop Time 1012    PT Time Calculation (min) 41 min    Equipment Utilized During Treatment Gait belt    Activity Tolerance Patient tolerated treatment well;Patient limited by pain    Behavior During Therapy WFL for tasks assessed/performed             Past Medical History:  Diagnosis Date   Anxiety    COPD (chronic obstructive pulmonary disease) (HCC)    Heart murmur    dx at age 40   Hypertension     Past Surgical History:  Procedure Laterality Date   NECK SURGERY      There were no vitals filed for this visit.   Subjective Assessment - 06/09/21 0934     Subjective No falls or new changes/complaints. No pain.    Limitations Standing;Walking    Patient Stated Goals To get around better    Currently in Pain? No/denies               [facility] Adult PT Treatment/Exercise - 06/09/21 0001       Transfers   Transfers Sit to Stand;Stand to Sit    Sit to Stand 6: Modified independent (Device/Increase time)    Stand to Sit 6: Modified independent (Device/Increase time)      Ambulation/Gait   Ambulation/Gait Yes    Ambulation/Gait Assistance 5: Supervision;4: Min guard    Ambulation/Gait Assistance Details around clinic with activities, intermittent cues for sequencing with SPC w/ quad tip    Ambulation Distance (Feet) --   clinic distances   Assistive device  Straight cane    Gait Pattern Step-through pattern;Ataxic;Decreased stride length    Ambulation Surface Level;Indoor      High Level Balance   High Level Balance Activities Negotitating around obstacles;Negotiating over obstacles    High Level Balance Comments completed ambulation forward with use of SPC w/ quad tip working on negotiating over obstalces and around cones, x 345 ft. Cues for use of SPC with negotiating over and sequencign to promote safety. Pt continue to demo mild unsteadiness with steps over obstacles. then on blue mat at countertop completd side stepping, backwards walkig and forward marching x 3 laps down and back of each of the following. increased challenge with marching due to imbalance with SLS and step length with backwards walking, requiring intermittent cues and UE support from counter      Exercises   Exercises Knee/Hip      Knee/Hip Exercises: Aerobic   Other Aerobic Scifit with BUE/BLE's level 4.0 x 8 minutes with goal >/= 70 steps per minute for strengthening and activity tolerance. Pt tolerating increased time well.      Knee/Hip Exercises: Standing   Hip Abduction Stengthening;Both;1 set;10 reps;Knee straight;Limitations    Abduction Limitations cues to keep knee straight, tactile cues intermittently required    Functional Squat  1 set;10 reps;Limitations    Functional Squat Limitations Cues for technique and slowed pace; progressed to squat touch down to mat working on improved control.            Access Code: [email or web address] URL: [email or web address] Date: 06/09/2021 Prepared by: Jethro Bastos  Exercises Sit to Stand Without Arm Support - 2 x daily - 5 x weekly - 2 sets - 10 reps Seated Piriformis Stretch - 2 x daily - 5 x weekly - 1 sets - 2-3 reps - 30 secs hold Tandem Stance with Support - 1 x daily - 5 x weekly - 1 sets - 2 reps - 20 secs hold Seated Calf Stretch with Strap - 1 x daily - 5 x weekly - 1 sets - 2-3 reps - 30 hold Romberg  Stance Eyes Closed on Foam Pad - 1 x daily - 5 x weekly - 1 sets - 3 reps - 30 sec hold Mini Squat with Counter Support - 1 x daily - 5 x weekly - 2 sets - 10 reps Heel Toe Raises with Counter Support - 1 x daily - 5 x weekly - 2 sets - 10 reps    PT Education - 06/09/21 1027     Education Details Updated HEP (Replaced heel/toe walking with heel/toe raises for improved form)    Person(s) Educated Patient    Methods Explanation;Demonstration;Handout    Comprehension Verbalized understanding;Returned demonstration              PT Short Term Goals - 05/22/21 1023       PT SHORT TERM GOAL #1   Title Pt will be IND with HEP in order to indicate improved functional mobility and dec fall risk.  (Target Date: 05/22/21)    Baseline 05/22/21- PT updated pt's HEP to include rhomberg on pillow in corner w/ chair in front to further improve balance    Time 3    Period Weeks    Status Achieved    Target Date 05/22/21      PT SHORT TERM GOAL #2   Title Pt will improve FGA to >/=17/30 in order to indicate decreased fall risk.    Baseline Not attempted; unable to assess on 04/22/21 due to unsafe (wet grass) 05/22/21- 17/30    Time 4    Period Weeks    Status Achieved    Target Date 04/22/21      PT SHORT TERM GOAL #3   Title Pt will perform TUG and update goal to reflect dec fall risk.    Baseline 05/22/21- pt performed TUG 13.22 seconds w/ SPC improving from 14.97 seconds and 10.63 seconds w/o SPC improving from 11.37 seconds    Time 3    Period Weeks    Status Achieved               PT Long Term Goals - 05/30/21 1131       PT LONG TERM GOAL #1   Title Pt will be Ind in a HEP to address cordination, strength, and ambualtion deficits (Target Date: 06/15/21)    Baseline ongoing goal    Time 6    Period Weeks    Status On-going    Target Date 06/15/21      PT LONG TERM GOAL #2   Title Pt will improve FGA to >/=21/30 in order to indicate dec fall risk.    Baseline 04/17/2021  FGA 13/30    Time 6    Period Weeks    Status  On-going      PT LONG TERM GOAL #3   Title Pt will ambulate x 300' over level indoor and unlevel outdoor paved surfaces with LRAD at S level in order to indicate safe community mobility.    Time 6    Period Weeks    Status On-going      PT LONG TERM GOAL #4   Title Pt will improve 5TSS to </=11.5 secs without UE support in order to indicate improved functional strength.    Baseline 05/30/21: 10.03 sec's no UE support from standard height surface    Time --    Period --    Status Achieved                   Plan - 06/09/21 1030     Clinical Impression Statement Continued strengthening and dynamic balance activities working on improved controlled movements. Pt continues to demo increased challenge with high level balance requiring intermittent UE support for stability. Will continue per POC. Potential plan to place PT services on hold until further evaluated by Neurologist.    Personal Factors and Comorbidities Age;Comorbidity 1;Comorbidity 2;Comorbidity 3+;Time since onset of injury/illness/exacerbation;Past/Current Experience    Comorbidities COPD, HTN, anxiety    Examination-Activity Limitations Locomotion Level;Squat;Stand;Stairs;Transfers    Stability/Clinical Decision Making Evolving/Moderate complexity    Rehab Potential Good    PT Frequency 2x / week    PT Duration 6 weeks    PT Treatment/Interventions ADLs/Self Care Home Management;Moist Heat;Gait training;Stair training;Functional mobility training;Therapeutic activities;Therapeutic exercise;Balance training;Neuromuscular re-education;Patient/family education;Manual techniques;Passive range of motion;Taping;Dry needling    PT Next Visit Plan Potential plan to place PT on hold until sees neurologist. Continue with gait training with quad tip cane, obstacles, stairs, curb/ramp with quad tip cane, LE/hip strength, and dynamic/static balance on compliant surfaces with narrow BOS  and EO/EC.    PT Home Exercise Plan [phone]; Access Code [email or web address]    Consulted and Agree with Plan of Care Patient             Patient will benefit from skilled therapeutic intervention in order to improve the following deficits and impairments:  Abnormal gait, Difficulty walking, Obesity, Decreased activity tolerance, Pain, Decreased balance, Decreased strength, Postural dysfunction  Visit Diagnosis: Muscle weakness (generalized)  Other abnormalities of gait and mobility  Difficulty in walking, not elsewhere classified  Other lack of coordination     Problem List Patient Active Problem List   Diagnosis Date Noted   Gastroesophageal reflux disease    COPD mixed type (HCC)    Hypertension 05/14/2017   Depression with anxiety 05/14/2017   Multifocal pneumonia 05/14/2017   Current smoker 05/14/2017    Tempie Donning, PT, DPT 06/09/2021, 10:41 AM  [facility] [address] [place], [place], [address] Phone: [phone]   Fax:  [phone]  Name: Molly [name] MRN: [id number] Date of Birth: 16-Apr-1956

## 2021-06-09 NOTE — Patient Instructions (Signed)
Access Code: [email or web address] URL: [email or web address] Date: 06/09/2021 Prepared by: Jethro Bastos  Exercises Sit to Stand Without Arm Support - 2 x daily - 5 x weekly - 2 sets - 10 reps Seated Piriformis Stretch - 2 x daily - 5 x weekly - 1 sets - 2-3 reps - 30 secs hold Tandem Stance with Support - 1 x daily - 5 x weekly - 1 sets - 2 reps - 20 secs hold Seated Calf Stretch with Strap - 1 x daily - 5 x weekly - 1 sets - 2-3 reps - 30 hold Romberg Stance Eyes Closed on Foam Pad - 1 x daily - 5 x weekly - 1 sets - 3 reps - 30 sec hold Mini Squat with Counter Support - 1 x daily - 5 x weekly - 2 sets - 10 reps Heel Toe Raises with Counter Support - 1 x daily - 5 x weekly - 2 sets - 10 reps

## 2021-06-13 ENCOUNTER — Ambulatory Visit: Payer: Medicare Other

## 2021-06-13 DIAGNOSIS — R278 Other lack of coordination: Secondary | ICD-10-CM | POA: Diagnosis not present

## 2021-06-13 DIAGNOSIS — R2689 Other abnormalities of gait and mobility: Secondary | ICD-10-CM

## 2021-06-13 DIAGNOSIS — R262 Difficulty in walking, not elsewhere classified: Secondary | ICD-10-CM

## 2021-06-13 DIAGNOSIS — M6281 Muscle weakness (generalized): Secondary | ICD-10-CM | POA: Diagnosis not present

## 2021-06-13 NOTE — Therapy (Signed)
[facility] [address] [place], [place], [address] Phone: [phone]   Fax:  [phone]  Physical Therapy Treatment/Re-Certification  Patient Details  Name: Molly [name] MRN: [id number] Date of Birth: Oct 06, 1955 Referring Provider (PT): Alroy Dust, L.Marlou Sa, MD   Encounter Date: 06/13/2021   PT End of Session - 06/13/21 0932     Visit Number 24    Number of Visits 32    Date for PT Re-Evaluation 07/18/21   updated POC   Authorization Type Primary-[employer] MEDICARE, Secondary-MEDICAID [place] ACCESS    Progress Note Due on Visit 14    PT Start Time 0931    PT Stop Time 1012    PT Time Calculation (min) 41 min    Equipment Utilized During Treatment Gait belt    Activity Tolerance Patient tolerated treatment well    Behavior During Therapy WFL for tasks assessed/performed             Past Medical History:  Diagnosis Date   Anxiety    COPD (chronic obstructive pulmonary disease) ([place])    Heart murmur    dx at age 61   Hypertension     Past Surgical History:  Procedure Laterality Date   NECK SURGERY      There were no vitals filed for this visit.   Subjective Assessment - 06/13/21 0933     Subjective No new changes/complaints. Mild tenderness in calf noted. No falls.    Limitations Standing;Walking    Patient Stated Goals To get around better    Currently in Pain? Yes    Pain Score 1     Pain Location Leg    Pain Orientation Right;Left    Pain Descriptors / Indicators Tender    Pain Type Acute pain    Pain Frequency Intermittent                [facility] PT Assessment - 06/13/21 0001       Assessment   Medical Diagnosis Other abnormalities of gait and mobility    Referring Provider (PT) Alroy Dust, L.Marlou Sa, MD      Functional Gait  Assessment   Gait assessed  Yes    Gait Level Surface Walks 20 ft, slow speed, abnormal gait pattern, evidence for imbalance or deviates 10-15 in outside of the  12 in walkway width. Requires more than 7 sec to ambulate 20 ft.    Change in Gait Speed Able to change speed, demonstrates mild gait deviations, deviates 6-10 in outside of the 12 in walkway width, or no gait deviations, unable to achieve a major change in velocity, or uses a change in velocity, or uses an assistive device.    Gait with Horizontal Head Turns Performs head turns smoothly with slight change in gait velocity (eg, minor disruption to smooth gait path), deviates 6-10 in outside 12 in walkway width, or uses an assistive device.    Gait with Vertical Head Turns Performs task with slight change in gait velocity (eg, minor disruption to smooth gait path), deviates 6 - 10 in outside 12 in walkway width or uses assistive device    Gait and Pivot Turn Pivot turns safely within 3 sec and stops quickly with no loss of balance.    Step Over Obstacle Is able to step over one shoe box (4.5 in total height) but must slow down and adjust steps to clear box safely. May require verbal cueing.    Gait with Narrow Base of Support Ambulates less than  4 steps heel to toe or cannot perform without assistance.    Gait with Eyes Closed Walks 20 ft, slow speed, abnormal gait pattern, evidence for imbalance, deviates 10-15 in outside 12 in walkway width. Requires more than 9 sec to ambulate 20 ft.    Ambulating Backwards Walks 20 ft, uses assistive device, slower speed, mild gait deviations, deviates 6-10 in outside 12 in walkway width.    Steps Alternating feet, must use rail.    Total Score 16    FGA comment: 16/30               [facility] Adult PT Treatment/Exercise - 06/13/21 0001       Transfers   Transfers Sit to Stand;Stand to Sit    Sit to Stand 6: Modified independent (Device/Increase time)    Five time sit to stand comments  10.23 secs without UE Support    Stand to Sit 6: Modified independent (Device/Increase time)      Ambulation/Gait   Ambulation/Gait Yes    Ambulation/Gait Assistance 5:  Supervision;4: Min guard    Ambulation/Gait Assistance Details completed ambulation indoors (unable to ambulat eoutdoors due to weather), completed ambulation x 400 ft on indoor level and unlevel (blue/red mat). Supervision on level surfaces, CGA on unlevel surfaces with use of SPC.    Ambulation Distance (Feet) 400 Feet    Assistive device Straight cane    Gait Pattern Step-through pattern;Ataxic;Decreased stride length    Ambulation Surface Level;Indoor;Unlevel      Exercises   Exercises Knee/Hip      Knee/Hip Exercises: Stretches   Gastroc Stretch Both;1 rep;30 seconds    Gastroc Stretch Limitations Performed standing calf stretch at countertop x 30 seconds, teaching patient how to proper complete with good form.             Balance Exercises - 06/13/21 0001       Balance Exercises: Standing   SLS with Vectors Intermittent upper extremity assist;Limitations;Solid surface    SLS with Vectors Limitations completed alteranting toe taps to cones bilaterally followed by step over with forward ambulation at countertop, x 4 laps, single UE support required. Cues for slowed/control pace.                PT Education - 06/13/21 1001     Education Details HEP update; Updated POC    Person(s) Educated Patient    Methods Explanation;Demonstration;Handout    Comprehension Returned demonstration;Verbalized understanding              PT Short Term Goals - 05/22/21 1023       PT SHORT TERM GOAL #1   Title Pt will be IND with HEP in order to indicate improved functional mobility and dec fall risk.  (Target Date: 05/22/21)    Baseline 05/22/21- PT updated pt's HEP to include rhomberg on pillow in corner w/ chair in front to further improve balance    Time 3    Period Weeks    Status Achieved    Target Date 05/22/21      PT SHORT TERM GOAL #2   Title Pt will improve FGA to >/=17/30 in order to indicate decreased fall risk.    Baseline Not attempted; unable to assess on  04/22/21 due to unsafe (wet grass) 05/22/21- 17/30    Time 4    Period Weeks    Status Achieved    Target Date 04/22/21      PT SHORT TERM GOAL #3   Title Pt  will perform TUG and update goal to reflect dec fall risk.    Baseline 05/22/21- pt performed TUG 13.22 seconds w/ SPC improving from 14.97 seconds and 10.63 seconds w/o SPC improving from 11.37 seconds    Time 3    Period Weeks    Status Achieved               PT Long Term Goals - 06/13/21 4827       PT LONG TERM GOAL #1   Title Pt will be Ind in a HEP to address cordination, strength, and ambualtion deficits (Target Date: 06/15/21)    Baseline reports independence with HEP, completing 4x/weekly    Time 6    Period Weeks    Status Achieved    Target Date 06/15/21      PT LONG TERM GOAL #2   Title Pt will improve FGA to >/=21/30 in order to indicate dec fall risk.    Baseline 04/17/2021 FGA 13/30; 16/30 on 12/23    Time 6    Period Weeks    Status Not Met      PT LONG TERM GOAL #3   Title Pt will ambulate x 300' over level indoor and unlevel outdoor paved surfaces with LRAD at S level in order to indicate safe community mobility.    Baseline 400 ft supervision level indoors, unable to ambulate outdoors due to weather, CGA on unlevel indoors    Time 6    Period Weeks    Status Partially Met      PT LONG TERM GOAL #4   Title Pt will improve 5TSS to </=11.5 secs without UE support in order to indicate improved functional strength.    Baseline 05/30/21: 10.03 sec's no UE support from standard height surface    Status Achieved            Updated Short Term Goals:   PT Short Term Goals - 06/13/21 1024       PT SHORT TERM GOAL #1   Title = LTGs               Updated Long Term Goals:    PT Long Term Goals - 06/13/21 1024       PT LONG TERM GOAL #1   Title Pt will be independent with HEP to address cordination, strength, and ambualtion deficits (Target Date: 07/18/21)    Baseline reports  independence with HEP, completing 4x/weekly; will benefit from progressive HEP    Time 4    Period Weeks    Status On-going    Target Date 07/18/21      PT LONG TERM GOAL #2   Title Pt will improve FGA to >/=21/30 in order to indicate dec fall risk.    Baseline 04/17/2021 FGA 13/30; 16/30 on 12/23    Time 4    Period Weeks    Status On-going      PT LONG TERM GOAL #3   Title Pt will ambulate x 500' outdoors on unlevel surfaces with LRAD at S level to indicate safe community mobility    Baseline 400 ft supervision level indoors, unable to ambulate outdoors due to weather, CGA on unlevel indoors    Time 4    Period Weeks    Status Revised      PT LONG TERM GOAL #4   Title LTG to be set for TUG as applicable    Baseline TBA    Time 4    Period Weeks  Status New                  Plan - 06/13/21 1019     Clinical Impression Statement Today's skilled PT session focused on assesment of patient's progress toward LTGs. Patient able to make progress toward LTGs, able to meet/partially meet LTG #1, 3 and 4. Patient is still at increased risk for falls with FGA score of 16/30. Cotninue to demo unsteadiness with SPC on unlevel surfaces. Unable to complete outdoors today due to weather.  Patient is making slow steady progress with PT services and Will continue to progress toward all unmet goals.    Personal Factors and Comorbidities Age;Comorbidity 1;Comorbidity 2;Comorbidity 3+;Time since onset of injury/illness/exacerbation;Past/Current Experience    Comorbidities COPD, HTN, anxiety    Examination-Activity Limitations Locomotion Level;Squat;Stand;Stairs;Transfers    Stability/Clinical Decision Making Evolving/Moderate complexity    Rehab Potential Good    PT Frequency 2x / week    PT Duration 4 weeks    PT Treatment/Interventions ADLs/Self Care Home Management;Moist Heat;Gait training;Stair training;Functional mobility training;Therapeutic activities;Therapeutic exercise;Balance  training;Neuromuscular re-education;Patient/family education;Manual techniques;Passive range of motion;Taping;Dry needling    PT Next Visit Plan Check TUG and determine if goal is needed. Continue with gait training with quad tip cane, obstacles, stairs, curb/ramp with quad tip cane, LE/hip strength, and dynamic/static balance on compliant surfaces with narrow BOS and EO/EC.    PT Home Exercise Plan [phone]; Access Code [email or web address]    Consulted and Agree with Plan of Care Patient             Patient will benefit from skilled therapeutic intervention in order to improve the following deficits and impairments:  Abnormal gait, Difficulty walking, Obesity, Decreased activity tolerance, Pain, Decreased balance, Decreased strength, Postural dysfunction  Visit Diagnosis: Muscle weakness (generalized)  Other abnormalities of gait and mobility  Difficulty in walking, not elsewhere classified  Other lack of coordination     Problem List Patient Active Problem List   Diagnosis Date Noted   Gastroesophageal reflux disease    COPD mixed type ([place])    Hypertension 05/14/2017   Depression with anxiety 05/14/2017   Multifocal pneumonia 05/14/2017   Current smoker 05/14/2017    Jones Bales, PT, DPT 06/13/2021, 10:23 AM  [facility] [address] [place], [place], [address] Phone: [phone]   Fax:  [phone]  Name: Molly [name] MRN: [id number] Date of Birth: 1956-06-13

## 2021-06-13 NOTE — Patient Instructions (Signed)
Access Code: [email or web address] URL: [email or web address] Date: 06/13/2021 Prepared by: Jethro Bastos  Exercises Sit to Stand Without Arm Support - 2 x daily - 5 x weekly - 2 sets - 10 reps Seated Piriformis Stretch - 2 x daily - 5 x weekly - 1 sets - 2-3 reps - 30 secs hold Tandem Stance with Support - 1 x daily - 5 x weekly - 1 sets - 2 reps - 20 secs hold Seated Calf Stretch with Strap - 1 x daily - 5 x weekly - 1 sets - 2-3 reps - 30 hold Romberg Stance Eyes Closed on Foam Pad - 1 x daily - 5 x weekly - 1 sets - 3 reps - 30 sec hold Mini Squat with Counter Support - 1 x daily - 5 x weekly - 2 sets - 10 reps Heel Toe Raises with Counter Support - 1 x daily - 5 x weekly - 2 sets - 10 reps Standing Gastroc Stretch at Counter - 1 x daily - 5 x weekly - 1 sets - 2-3 reps - 30 seconds hold

## 2021-06-17 ENCOUNTER — Ambulatory Visit: Payer: Medicare Other | Admitting: Physical Therapy

## 2021-06-20 ENCOUNTER — Other Ambulatory Visit: Payer: Self-pay

## 2021-06-20 ENCOUNTER — Ambulatory Visit: Payer: Medicare Other

## 2021-06-20 DIAGNOSIS — R278 Other lack of coordination: Secondary | ICD-10-CM | POA: Diagnosis not present

## 2021-06-20 DIAGNOSIS — R262 Difficulty in walking, not elsewhere classified: Secondary | ICD-10-CM | POA: Diagnosis not present

## 2021-06-20 DIAGNOSIS — M6281 Muscle weakness (generalized): Secondary | ICD-10-CM | POA: Diagnosis not present

## 2021-06-20 DIAGNOSIS — R2689 Other abnormalities of gait and mobility: Secondary | ICD-10-CM

## 2021-06-20 NOTE — Therapy (Signed)
[facility] [address] [place], [place], [address] Phone: [phone]   Fax:  [phone]  Physical Therapy Treatment  Patient Details  Name: Molly [name] MRN: [id number] Date of Birth: [birth year] Referring Provider (PT): Clovis Riley, L.August Saucer, MD   Encounter Date: 06/20/2021   PT End of Session - 06/20/21 0933     Visit Number 25    Number of Visits 32    Date for PT Re-Evaluation 07/18/21   updated POC   Authorization Type Primary-[employer] MEDICARE, Secondary-MEDICAID [place] ACCESS    Progress Note Due on Visit 30    PT Start Time 0933    PT Stop Time 1013    PT Time Calculation (min) 40 min    Equipment Utilized During Treatment Gait belt    Activity Tolerance Patient tolerated treatment well    Behavior During Therapy WFL for tasks assessed/performed             Past Medical History:  Diagnosis Date   Anxiety    COPD (chronic obstructive pulmonary disease) ([facility])    Heart murmur    dx at age 65   Hypertension     Past Surgical History:  Procedure Laterality Date   NECK SURGERY      There were no vitals filed for this visit.   Subjective Assessment - 06/20/21 0935     Subjective Patient reports calf muscles are feeling better, tenderness subsided. No other new changes. No falls.    Limitations Standing;Walking    Patient Stated Goals To get around better    Currently in Pain? No/denies                [facility] PT Assessment - 06/20/21 0001       Ambulation/Gait   Ambulation/Gait Assistance Details continued ambulation throughout therapy session with use of SPC, completed gait outdoors on unlevel surfaces with SPC w/ quad tip, increased challenge with unlevel surfaces noted. CGA and one instance of Min A required on grass surfaces. pt had increased challenge with dual tasking and further balance challenge when began to wave at family member in parking lot.    Ambulation Distance (Feet) 500  Feet      High Level Balance   High Level Balance Activities Marching forwards;Backward walking    High Level Balance Comments in // bars: on blue mat completed marching forwards followed by backwards walking x 5 laps, with progression from BUE support to no UE support, CGA and cues for slowed control paced. then completed lateral sidesteppingwithout UE support x 3 laps.      Standardized Balance Assessment   Standardized Balance Assessment Timed Up and Go Test      Timed Up and Go Test   TUG Normal TUG    Normal TUG (seconds) 13.87    TUG Comments with use of SPC               [facility] Adult PT Treatment/Exercise - 06/20/21 0001       Transfers   Transfers Sit to Stand;Stand to Sit    Sit to Stand 6: Modified independent (Device/Increase time)    Stand to Sit 6: Modified independent (Device/Increase time)      Ambulation/Gait   Ambulation/Gait Yes    Ambulation/Gait Assistance 5: Supervision;4: Min guard    Assistive device Straight cane   with quad tip attachment   Gait Pattern Step-through pattern;Ataxic;Decreased stride length    Ambulation Surface Level;Indoor      Exercises  Exercises Knee/Hip      Knee/Hip Exercises: Aerobic   Other Aerobic Scifit with BUE/BLE's level 4.0 x 5 minutes with goal >/= 70 steps per minute for strengthening and activity tolerance.              Balance Exercises - 06/20/21 0001       Balance Exercises: Standing   Standing Eyes Opened Wide (BOA);Narrow base of support (BOS);Head turns;Foam/compliant surface;Limitations    Standing Eyes Opened Limitations EO with horizontal/vertical head turns x 10 reps each. progressing from wide BOS to narrow BOS.    Standing Eyes Closed Wide (BOA);Foam/compliant surface;3 reps;30 secs;Limitations    Standing Eyes Closed Limitations increased sway noted, often with posterior retropulsion, require tactile cues from PT to correct.    Sit to Stand Without upper extremity support;Foam/compliant  surface;Limitations    Sit to Stand Limitations completed x 10 reps with BLE on airex, CGA. cues for forward lean to promote improved technique.                  PT Short Term Goals - 06/13/21 1024       PT SHORT TERM GOAL #1   Title = LTGs               PT Long Term Goals - 06/20/21 1017       PT LONG TERM GOAL #1   Title Pt will be independent with HEP to address cordination, strength, and ambualtion deficits (Target Date: 07/18/21)    Baseline reports independence with HEP, completing 4x/weekly; will benefit from progressive HEP    Time 4    Period Weeks    Status On-going    Target Date 07/18/21      PT LONG TERM GOAL #2   Title Pt will improve FGA to >/=21/30 in order to indicate dec fall risk.    Baseline 04/17/2021 FGA 13/30; 16/30 on 12/23    Time 4    Period Weeks    Status On-going      PT LONG TERM GOAL #3   Title Pt will ambulate x 500' outdoors on unlevel surfaces with LRAD at S level to indicate safe community mobility    Baseline 400 ft supervision level indoors, unable to ambulate outdoors due to weather, CGA on unlevel indoors    Time 4    Period Weeks    Status Revised      PT LONG TERM GOAL #4   Title Patient will improve TUG with SPC to </= 12 seconds    Baseline 13.87    Time 4    Period Weeks    Status Revised                   Plan - 06/20/21 0942     Clinical Impression Statement Assessed TUG today with patient completing in 13.87 secs with SPC. Trialed gait outdoors on unlevel surfaces with unsteadiness noted requiring CGA continued cues for reminders to use [facility] for improved balance, as well as increased challenge with dual tasking. Rest of session focused on continued balance training. Will continue per POC    Personal Factors and Comorbidities Age;Comorbidity 1;Comorbidity 2;Comorbidity 3+;Time since onset of injury/illness/exacerbation;Past/Current Experience    Comorbidities COPD, HTN, anxiety     Examination-Activity Limitations Locomotion Level;Squat;Stand;Stairs;Transfers    Stability/Clinical Decision Making Evolving/Moderate complexity    Rehab Potential Good    PT Frequency 2x / week    PT Duration 4 weeks    PT Treatment/Interventions ADLs/Self  Care Home Management;Moist Heat;Gait training;Stair training;Functional mobility training;Therapeutic activities;Therapeutic exercise;Balance training;Neuromuscular re-education;Patient/family education;Manual techniques;Passive range of motion;Taping;Dry needling    PT Next Visit Plan Continue with gait training with quad tip cane, obstacles, stairs, curb/ramp with quad tip cane, LE/hip strength, and dynamic/static balance on compliant surfaces with narrow BOS and EO/EC.    PT Home Exercise Plan [phone]; Access Code [email or web address]    Consulted and Agree with Plan of Care Patient             Patient will benefit from skilled therapeutic intervention in order to improve the following deficits and impairments:  Abnormal gait, Difficulty walking, Obesity, Decreased activity tolerance, Pain, Decreased balance, Decreased strength, Postural dysfunction  Visit Diagnosis: Muscle weakness (generalized)  Other abnormalities of gait and mobility  Difficulty in walking, not elsewhere classified  Other lack of coordination     Problem List Patient Active Problem List   Diagnosis Date Noted   Gastroesophageal reflux disease    COPD mixed type (HCC)    Hypertension 05/14/2017   Depression with anxiety 05/14/2017   Multifocal pneumonia 05/14/2017   Current smoker 05/14/2017    Tempie Donning, PT, DPT 06/20/2021, 10:18 AM  [facility] [address] [place], [place], [address] Phone: [phone]   Fax:  [phone]  Name: CAYDEN RAUTIO MRN: [id number] Date of Birth: 1955/07/18

## 2021-06-24 ENCOUNTER — Other Ambulatory Visit: Payer: Self-pay

## 2021-06-24 ENCOUNTER — Encounter: Payer: Self-pay | Admitting: Physical Therapy

## 2021-06-24 ENCOUNTER — Ambulatory Visit: Payer: Medicare Other | Attending: Family Medicine | Admitting: Physical Therapy

## 2021-06-24 DIAGNOSIS — R262 Difficulty in walking, not elsewhere classified: Secondary | ICD-10-CM | POA: Insufficient documentation

## 2021-06-24 DIAGNOSIS — R278 Other lack of coordination: Secondary | ICD-10-CM | POA: Diagnosis not present

## 2021-06-24 DIAGNOSIS — M6281 Muscle weakness (generalized): Secondary | ICD-10-CM | POA: Diagnosis not present

## 2021-06-24 DIAGNOSIS — R2689 Other abnormalities of gait and mobility: Secondary | ICD-10-CM | POA: Insufficient documentation

## 2021-06-24 NOTE — Therapy (Signed)
[facility] [address] [place], [place], [address] Phone: [phone]   Fax:  [phone]  Physical Therapy Treatment  Patient Details  Name: Molly [name] MRN: [id number] Date of Birth: Jul 12, 1955 Referring Provider (PT): Clovis Riley, L.August Saucer, MD   Encounter Date: 06/24/2021   PT End of Session - 06/24/21 0933     Visit Number 26    Number of Visits 32    Date for PT Re-Evaluation 07/18/21   updated POC   Authorization Type Primary-UHC MEDICARE, Secondary-MEDICAID [place] ACCESS    Progress Note Due on Visit 30    PT Start Time 0931    PT Stop Time 1013    PT Time Calculation (min) 42 min    Equipment Utilized During Treatment Gait belt    Activity Tolerance Patient tolerated treatment well    Behavior During Therapy WFL for tasks assessed/performed             Past Medical History:  Diagnosis Date   Anxiety    COPD (chronic obstructive pulmonary disease) (HCC)    Heart murmur    dx at age 66   Hypertension     Past Surgical History:  Procedure Laterality Date   NECK SURGERY      There were no vitals filed for this visit.   Subjective Assessment - 06/24/21 0932     Subjective No new complaints. No falls or pain to report.    Limitations Standing;Walking    Patient Stated Goals To get around better    Currently in Pain? No/denies                    [facility] Adult PT Treatment/Exercise - 06/24/21 0933       Transfers   Transfers Sit to Stand;Stand to Sit    Sit to Stand 6: Modified independent (Device/Increase time)    Stand to Sit 6: Modified independent (Device/Increase time)      Ambulation/Gait   Ambulation/Gait Yes    Ambulation/Gait Assistance 5: Supervision;4: Min guard    Ambulation/Gait Assistance Details continued to address gait on various surfaces with cane- had pt scanning enviroment randomly on outdoor surfaces- min guard assist for safety with veering noted at times.  supervision for gait indoors around clinic with session    Ambulation Distance (Feet) 500 Feet    Assistive device Straight cane   with rubber quad tip   Gait Pattern Step-through pattern;Ataxic;Decreased stride length    Ambulation Surface Level;Indoor      Knee/Hip Exercises: Aerobic   Nustep BUEs/LEs at level 5 resistance x 8 minutes maintaining steps per minute  in the 50-60's for overall strengthening and endurance.                 Balance Exercises - 06/24/21 0949       Balance Exercises: Standing   SLS with Vectors Foam/compliant surface;Upper extremity assist 2;Other reps (comment);Limitations    SLS with Vectors Limitations on balance board in anterior/posterior direction with 2 tall cones on floor in front of board, light fingertip support both sides on bars-alternating forward foot taps, cross foot taps, forward double foot taps and cross double foot taps for ~10 reps each, min guard to min assist with cues on stance position, hip/knee flexion and weight shfiting.    Rockerboard Anterior/posterior;Lateral;Head turns;EO;EC;30 seconds;Other reps (comment);Intermittent UE support;Limitations    Rockerboard Limitations performed both ways on balance board: rocking the board with emphasis on tall posture with EO, progressing  to [facility]; then holding the board steady for EC 30 sec's x 3 reps, progressing to EC head movements left<>right, up<>down for ~10 reps each. Min guard to min assist for balance.    Sit to Stand Standard surface;Without upper extremity support;Foam/compliant surface;Limitations    Sit to Stand Limitations seated at edge of mat with feet on airex: sit<>stands for 10 reps with no UE assist, cues for tall standing and controlled sitting.                  PT Short Term Goals - 06/13/21 1024       PT SHORT TERM GOAL #1   Title = LTGs               PT Long Term Goals - 06/20/21 1017       PT LONG TERM GOAL #1   Title Pt will be independent with  HEP to address cordination, strength, and ambualtion deficits (Target Date: 07/18/21)    Baseline reports independence with HEP, completing 4x/weekly; will benefit from progressive HEP    Time 4    Period Weeks    Status On-going    Target Date 07/18/21      PT LONG TERM GOAL #2   Title Pt will improve FGA to >/=21/30 in order to indicate dec fall risk.    Baseline 04/17/2021 FGA 13/30; 16/30 on 12/23    Time 4    Period Weeks    Status On-going      PT LONG TERM GOAL #3   Title Pt will ambulate x 500' outdoors on unlevel surfaces with LRAD at S level to indicate safe community mobility    Baseline 400 ft supervision level indoors, unable to ambulate outdoors due to weather, CGA on unlevel indoors    Time 4    Period Weeks    Status Revised      PT LONG TERM GOAL #4   Title Patient will improve TUG with SPC to </= 12 seconds    Baseline 13.87    Time 4    Period Weeks    Status Revised                   Plan - 06/24/21 0933     Clinical Impression Statement Today's skilled session continued to focus on strengthening, gait with cane and balance traning. Pt continues to have unsteadiness on outdoor surfaces needing min guard assist for safety. No other issues noted or reported in session. The pt is making steady progress toward goals and should benefit from continued PT to progress toward unmet goals.    Personal Factors and Comorbidities Age;Comorbidity 1;Comorbidity 2;Comorbidity 3+;Time since onset of injury/illness/exacerbation;Past/Current Experience    Comorbidities COPD, HTN, anxiety    Examination-Activity Limitations Locomotion Level;Squat;Stand;Stairs;Transfers    Stability/Clinical Decision Making Evolving/Moderate complexity    Rehab Potential Good    PT Frequency 2x / week    PT Duration 4 weeks    PT Treatment/Interventions ADLs/Self Care Home Management;Moist Heat;Gait training;Stair training;Functional mobility training;Therapeutic activities;Therapeutic  exercise;Balance training;Neuromuscular re-education;Patient/family education;Manual techniques;Passive range of motion;Taping;Dry needling    PT Next Visit Plan Continue with gait training with quad tip cane, obstacles, stairs, curb/ramp with quad tip cane, LE/hip strength, and dynamic/static balance on compliant surfaces with narrow BOS and EO/EC.    PT Home Exercise Plan [id number]; Access Code [email or web address]    Consulted and Agree with Plan of Care Patient  Patient will benefit from skilled therapeutic intervention in order to improve the following deficits and impairments:  Abnormal gait, Difficulty walking, Obesity, Decreased activity tolerance, Pain, Decreased balance, Decreased strength, Postural dysfunction  Visit Diagnosis: Muscle weakness (generalized)  Other abnormalities of gait and mobility  Difficulty in walking, not elsewhere classified     Problem List Patient Active Problem List   Diagnosis Date Noted   Gastroesophageal reflux disease    COPD mixed type (HCC)    Hypertension 05/14/2017   Depression with anxiety 05/14/2017   Multifocal pneumonia 05/14/2017   Current smoker 05/14/2017    Sallyanne KusterKathy Romolo Sieling, PTA, [facility] [address], [address] [place], [place] [address] [phone] 06/24/21, 3:16 PM   Name: Phebe CollaDeborah A Dutch MRN: [id number] Date of Birth: [birth year]

## 2021-06-26 ENCOUNTER — Encounter: Payer: Self-pay | Admitting: Physical Therapy

## 2021-06-26 ENCOUNTER — Ambulatory Visit: Payer: Medicare Other | Admitting: Cardiovascular Disease

## 2021-06-26 ENCOUNTER — Other Ambulatory Visit: Payer: Self-pay

## 2021-06-26 ENCOUNTER — Ambulatory Visit: Payer: Medicare Other | Admitting: Physical Therapy

## 2021-06-26 DIAGNOSIS — R262 Difficulty in walking, not elsewhere classified: Secondary | ICD-10-CM

## 2021-06-26 DIAGNOSIS — M6281 Muscle weakness (generalized): Secondary | ICD-10-CM | POA: Diagnosis not present

## 2021-06-26 DIAGNOSIS — R2689 Other abnormalities of gait and mobility: Secondary | ICD-10-CM | POA: Diagnosis not present

## 2021-06-26 DIAGNOSIS — R278 Other lack of coordination: Secondary | ICD-10-CM | POA: Diagnosis not present

## 2021-06-26 NOTE — Therapy (Signed)
[facility] [address] [place], [place], [address] Phone: [phone]   Fax:  [phone]  Physical Therapy Treatment  Patient Details  Name: Molly [name] MRN: [id number] Date of Birth: 1955/12/27 Referring Provider (PT): Alroy Dust, L.Marlou Sa, MD   Encounter Date: 06/26/2021   PT End of Session - 06/26/21 0940     Visit Number 27    Number of Visits 32    Date for PT Re-Evaluation 07/18/21   updated POC   Authorization Type Primary-UHC MEDICARE, Secondary-MEDICAID [place] ACCESS    Progress Note Due on Visit 63    PT Start Time 0935    PT Stop Time 1013    PT Time Calculation (min) 38 min    Equipment Utilized During Treatment Gait belt    Activity Tolerance Patient tolerated treatment well    Behavior During Therapy WFL for tasks assessed/performed             Past Medical History:  Diagnosis Date   Anxiety    COPD (chronic obstructive pulmonary disease) ([place])    Heart murmur    dx at age 14   Hypertension     Past Surgical History:  Procedure Laterality Date   NECK SURGERY      There were no vitals filed for this visit.   Subjective Assessment - 06/26/21 0940     Subjective No new complaints. No falls or pain to report. Was tired after last session.    Limitations Standing;Walking    Patient Stated Goals To get around better    Currently in Pain? No/denies    Pain Score 0-No pain                   [facility] Adult PT Treatment/Exercise - 06/26/21 0940       Transfers   Transfers Sit to Stand;Stand to Sit    Sit to Stand 6: Modified independent (Device/Increase time)    Stand to Sit 6: Modified independent (Device/Increase time)      Ambulation/Gait   Ambulation/Gait Yes    Ambulation/Gait Assistance 5: Supervision;4: Min guard    Ambulation Distance (Feet) --   around clinic with session   Assistive device Straight cane   with rubber quad tip   Gait Pattern Step-through  pattern;Ataxic;Decreased stride length    Ambulation Surface Level;Indoor      Neuro Re-ed    Neuro Re-ed Details  for balance/muscle re-ed: gait along ~50 foot hallway with cane working on scanning left<>forward<>right, then up<>forward<>down for 2 lap each with min guard to min assist. veering and decreased gait speed noted.      Knee/Hip Exercises: Aerobic   Other Aerobic Scifit with BUE/BLE's level 4.0 x 8 minutes with goal >/= 70 steps per minute for strengthening and activity tolerance.                 Balance Exercises - 06/26/21 0955       Balance Exercises: Standing   Standing Eyes Opened Wide (BOA);Head turns;Foam/compliant surface;Other reps (comment);Limitations    Standing Eyes Opened Limitations standing across red foam beam with feet hip width apart for head movements left<>right, up<>down for 10 reps each with min assist for balance.    Standing Eyes Closed Narrow base of support (BOS);Wide (BOA);Head turns;Foam/compliant surface;Other reps (comment);30 secs;Limitations    Standing Eyes Closed Limitations on airex with no UE support, increased postural sway noted with posterior bias: feet together for EC 30 sec's x 3 reps, progressing  to feet apart for EC head movements left<>right, up<>down for ~10 reps each. Min guard to min assist for balance with cues on posture and weight shifting for balance assistance.    Wall Bumps Hip;Eyes opened;Other reps (comment);Limitations    Wall Bumps Limitations Floor, progressing to airex with feet hip width apart on each surface for 10 reps each, min guard assist for balance.    Balance Beam standing across red foam beam with light to no UE support on counter: alternating stepping forward to floor/back onto beam, then alternating backward stepping to floor/back onto beam for ~10 reps each. cues for increased step length/height and weight shifitng.                  PT Short Term Goals - 06/13/21 1024       PT SHORT TERM  GOAL #1   Title = LTGs               PT Long Term Goals - 06/20/21 1017       PT LONG TERM GOAL #1   Title Pt will be independent with HEP to address cordination, strength, and ambualtion deficits (Target Date: 07/18/21)    Baseline reports independence with HEP, completing 4x/weekly; will benefit from progressive HEP    Time 4    Period Weeks    Status On-going    Target Date 07/18/21      PT LONG TERM GOAL #2   Title Pt will improve FGA to >/=21/30 in order to indicate dec fall risk.    Baseline 04/17/2021 FGA 13/30; 16/30 on 12/23    Time 4    Period Weeks    Status On-going      PT LONG TERM GOAL #3   Title Pt will ambulate x 500' outdoors on unlevel surfaces with LRAD at S level to indicate safe community mobility    Baseline 400 ft supervision level indoors, unable to ambulate outdoors due to weather, CGA on unlevel indoors    Time 4    Period Weeks    Status Revised      PT LONG TERM GOAL #4   Title Patient will improve TUG with SPC to </= 12 seconds    Baseline 13.87    Time 4    Period Weeks    Status Revised                   Plan - 06/26/21 0940     Clinical Impression Statement Today's skilled session continued to focus on strengthening and balance training on compliant surfaces. Pt continues to need up to min assist for balance with challenges, does demonstrate improved stepping strategies at times. The pt is making steady progress toward goals and should benefit from continued PT to progress toward unmet goals.    Personal Factors and Comorbidities Age;Comorbidity 1;Comorbidity 2;Comorbidity 3+;Time since onset of injury/illness/exacerbation;Past/Current Experience    Comorbidities COPD, HTN, anxiety    Examination-Activity Limitations Locomotion Level;Squat;Stand;Stairs;Transfers    Stability/Clinical Decision Making Evolving/Moderate complexity    Rehab Potential Good    PT Frequency 2x / week    PT Duration 4 weeks    PT  Treatment/Interventions ADLs/Self Care Home Management;Moist Heat;Gait training;Stair training;Functional mobility training;Therapeutic activities;Therapeutic exercise;Balance training;Neuromuscular re-education;Patient/family education;Manual techniques;Passive range of motion;Taping;Dry needling    PT Next Visit Plan Continue with gait training with quad tip cane, obstacles, stairs, curb/ramp with quad tip cane, LE/hip strength, and dynamic/static balance on compliant surfaces with narrow BOS and EO/EC.  PT Home Exercise Plan [id number]; Access Code [email or web address]    Consulted and Agree with Plan of Care Patient             Patient will benefit from skilled therapeutic intervention in order to improve the following deficits and impairments:  Abnormal gait, Difficulty walking, Obesity, Decreased activity tolerance, Pain, Decreased balance, Decreased strength, Postural dysfunction  Visit Diagnosis: Muscle weakness (generalized)  Other abnormalities of gait and mobility  Difficulty in walking, not elsewhere classified     Problem List Patient Active Problem List   Diagnosis Date Noted   Gastroesophageal reflux disease    COPD mixed type ([place])    Hypertension 05/14/2017   Depression with anxiety 05/14/2017   Multifocal pneumonia 05/14/2017   Current smoker 05/14/2017    Willow Ora, PTA, [facility] [address] [place], [place] [address] [phone] 06/26/21, 2:17 PM   Name: Molly [name] MRN: [id number] Date of Birth: 1956-06-08

## 2021-06-30 ENCOUNTER — Ambulatory Visit: Payer: Medicare Other

## 2021-06-30 ENCOUNTER — Other Ambulatory Visit: Payer: Self-pay

## 2021-06-30 DIAGNOSIS — M6281 Muscle weakness (generalized): Secondary | ICD-10-CM

## 2021-06-30 DIAGNOSIS — R262 Difficulty in walking, not elsewhere classified: Secondary | ICD-10-CM

## 2021-06-30 DIAGNOSIS — R278 Other lack of coordination: Secondary | ICD-10-CM

## 2021-06-30 DIAGNOSIS — R2689 Other abnormalities of gait and mobility: Secondary | ICD-10-CM

## 2021-06-30 NOTE — Therapy (Signed)
[facility] [address] [place], [place], [address] Phone: [phone]   Fax:  [phone]  Physical Therapy Treatment  Patient Details  Name: Molly [name] MRN: [id number] Date of Birth: 06/09/1956 Referring Provider (PT): Alroy Dust, L.Marlou Sa, MD   Encounter Date: 06/30/2021   PT End of Session - 06/30/21 0838     Visit Number 28    Number of Visits 32    Date for PT Re-Evaluation 07/18/21   updated POC   Authorization Type Primary-UHC MEDICARE, Secondary-MEDICAID [place] ACCESS    Progress Note Due on Visit 76    PT Start Time 0835    PT Stop Time 0918    PT Time Calculation (min) 43 min    Equipment Utilized During Treatment Gait belt    Activity Tolerance Patient tolerated treatment well    Behavior During Therapy WFL for tasks assessed/performed             Past Medical History:  Diagnosis Date   Anxiety    COPD (chronic obstructive pulmonary disease) ([place])    Heart murmur    dx at age 20   Hypertension     Past Surgical History:  Procedure Laterality Date   NECK SURGERY      There were no vitals filed for this visit.   Subjective Assessment - 06/30/21 0838     Subjective No new changes/complaints. No falls. No recent cramps in the calf, report that the stretching helped.    Limitations Standing;Walking    Patient Stated Goals To get around better    Currently in Pain? No/denies              [facility] Adult PT Treatment/Exercise - 06/30/21 0001       Transfers   Transfers Sit to Stand;Stand to Sit    Sit to Stand 6: Modified independent (Device/Increase time)    Stand to Sit 6: Modified independent (Device/Increase time)      Ambulation/Gait   Ambulation/Gait Yes    Ambulation/Gait Assistance 5: Supervision;4: Min guard    Ambulation/Gait Assistance Details ambulation throughout therapy gym with high level balance    Assistive device Straight cane   with rubber quad tip   Gait  Pattern Step-through pattern;Ataxic;Decreased stride length    Ambulation Surface Level;Indoor      High Level Balance   High Level Balance Activities Negotiating over obstacles;Negotitating around obstacles;Head turns;Turns    High Level Balance Comments Completed ambulation with obstacle course, including head turns/nods, negotiating around/over obstacles (hurdles) and addition of body turns to hcange directions. Completed x 300 ft CGA. Continue to demo increased challenge sequencing with SPC w/ quad tip with high level balance and often demo scissoring due to ataxia.      Exercises   Exercises Knee/Hip      Knee/Hip Exercises: Aerobic   Other Aerobic Scifit with BUE/BLE's level 4.0 x 8 minutes with goal >/= 70 steps per minute for strengthening and activity tolerance. Pt able to maintain conversation throughout.      Knee/Hip Exercises: Standing   Hip Abduction Stengthening;Both;1 set;15 reps;Knee straight;Limitations    Abduction Limitations external cues for toe tap to promote technique and pace, cues to keep knee straigth and toes pointed forward.    Lateral Step Up Both;1 set;10 reps;Hand Hold: 2;Step Height: 6"   light UE support, cues for step length                Balance Exercises - 06/30/21 0001  Balance Exercises: Standing   Standing Eyes Opened Wide (BOA);Head turns;Foam/compliant surface;Limitations    Standing Eyes Opened Limitations standing across airex with feet hip width apart for head movements left<>right, up<>down for 10 reps each with CGA for balance    Standing Eyes Closed Wide (BOA);Foam/compliant surface;3 reps;20 secs    Standing Eyes Closed Limitations on airex with no UE support, increased postural sway noted with posterior sway feet apart > together for EC 30 sec's x 3 reps.    Marching Solid surface;Static;Limitations    Marching Limitations standing on firm surface completed alternating slow march without UE support x 10 reps bilat, increased  challenge with SLS on RLE > LLE.    Other Standing Exercises standing on airex with feet hip width and 2# weighted ball, completed trunk rotation to R/L 2 x 10 reps, CGA. Seated rest break required due to fatigue.                  PT Short Term Goals - 06/13/21 1024       PT SHORT TERM GOAL #1   Title = LTGs               PT Long Term Goals - 06/20/21 1017       PT LONG TERM GOAL #1   Title Pt will be independent with HEP to address cordination, strength, and ambualtion deficits (Target Date: 07/18/21)    Baseline reports independence with HEP, completing 4x/weekly; will benefit from progressive HEP    Time 4    Period Weeks    Status On-going    Target Date 07/18/21      PT LONG TERM GOAL #2   Title Pt will improve FGA to >/=21/30 in order to indicate dec fall risk.    Baseline 04/17/2021 FGA 13/30; 16/30 on 12/23    Time 4    Period Weeks    Status On-going      PT LONG TERM GOAL #3   Title Pt will ambulate x 500' outdoors on unlevel surfaces with LRAD at S level to indicate safe community mobility    Baseline 400 ft supervision level indoors, unable to ambulate outdoors due to weather, CGA on unlevel indoors    Time 4    Period Weeks    Status Revised      PT LONG TERM GOAL #4   Title Patient will improve TUG with SPC to </= 12 seconds    Baseline 13.87    Time 4    Period Weeks    Status Revised                   Plan - 06/30/21 0920     Clinical Impression Statement Continued balance activities with continued focus on complaint surfaces and SLS. Increased challenge with SLS, therefore transitioned to proximal hip strengthening. Patietn continue to require CGA intermittent for balance and intermittent rest breaks due to fatigue in BLE. Will continue per POC.    Personal Factors and Comorbidities Age;Comorbidity 1;Comorbidity 2;Comorbidity 3+;Time since onset of injury/illness/exacerbation;Past/Current Experience    Comorbidities COPD, HTN,  anxiety    Examination-Activity Limitations Locomotion Level;Squat;Stand;Stairs;Transfers    Stability/Clinical Decision Making Evolving/Moderate complexity    Rehab Potential Good    PT Frequency 2x / week    PT Duration 4 weeks    PT Treatment/Interventions ADLs/Self Care Home Management;Moist Heat;Gait training;Stair training;Functional mobility training;Therapeutic activities;Therapeutic exercise;Balance training;Neuromuscular re-education;Patient/family education;Manual techniques;Passive range of motion;Taping;Dry needling    PT Next Visit  Plan Continue with gait training with quad tip cane, obstacles, stairs, curb/ramp with quad tip cane, LE/hip strength, and dynamic/static balance on compliant surfaces with narrow BOS and EO/EC. Maybe try tall kneeling/quadruped?    PT Home Exercise Plan [id number]; Access Code [email or web address]    Consulted and Agree with Plan of Care Patient             Patient will benefit from skilled therapeutic intervention in order to improve the following deficits and impairments:  Abnormal gait, Difficulty walking, Obesity, Decreased activity tolerance, Pain, Decreased balance, Decreased strength, Postural dysfunction  Visit Diagnosis: Muscle weakness (generalized)  Other abnormalities of gait and mobility  Difficulty in walking, not elsewhere classified  Other lack of coordination     Problem List Patient Active Problem List   Diagnosis Date Noted   Gastroesophageal reflux disease    COPD mixed type ([place])    Hypertension 05/14/2017   Depression with anxiety 05/14/2017   Multifocal pneumonia 05/14/2017   Current smoker 05/14/2017    Jones Bales, PT, DPT 06/30/2021, 9:22 AM  [facility] [address] [place], [place], [address] Phone: [phone]   Fax:  [phone]  Name: Molly [name] MRN: [id number] Date of Birth: [birth year]

## 2021-07-04 ENCOUNTER — Ambulatory Visit: Payer: Medicare Other

## 2021-07-04 ENCOUNTER — Other Ambulatory Visit: Payer: Self-pay

## 2021-07-04 DIAGNOSIS — R262 Difficulty in walking, not elsewhere classified: Secondary | ICD-10-CM

## 2021-07-04 DIAGNOSIS — M6281 Muscle weakness (generalized): Secondary | ICD-10-CM | POA: Diagnosis not present

## 2021-07-04 DIAGNOSIS — R278 Other lack of coordination: Secondary | ICD-10-CM

## 2021-07-04 DIAGNOSIS — R2689 Other abnormalities of gait and mobility: Secondary | ICD-10-CM

## 2021-07-04 NOTE — Therapy (Addendum)
[facility] [address] [place], [place], [address] Phone: [phone]   Fax:  [phone]  Physical Therapy Treatment/Discharge  Patient Details  Name: Molly [name] MRN: [id number] Date of Birth: Apr 18, 1956 Referring Provider (PT): Alroy Dust, L.Marlou Sa, MD   Encounter Date: 07/04/2021   PT End of Session - 07/04/21 0930     Visit Number 29    Number of Visits 32    Date for PT Re-Evaluation 07/18/21   updated POC   Authorization Type Primary-[employer] MEDICARE, Secondary-MEDICAID  ACCESS    Progress Note Due on Visit 30    PT Start Time 0930    PT Stop Time 1005    PT Time Calculation (min) 35 min    Equipment Utilized During Treatment Gait belt    Activity Tolerance Patient tolerated treatment well    Behavior During Therapy WFL for tasks assessed/performed             Past Medical History:  Diagnosis Date   Anxiety    COPD (chronic obstructive pulmonary disease) ([place])    Heart murmur    dx at age 66   Hypertension     Past Surgical History:  Procedure Laterality Date   NECK SURGERY      There were no vitals filed for this visit.   Subjective Assessment - 07/04/21 0930     Subjective No new changes/complaints.    Limitations Standing;Walking    Patient Stated Goals To get around better    Currently in Pain? No/denies                [facility] PT Assessment - 07/04/21 0001       Assessment   Medical Diagnosis Other abnormalities of gait and mobility    Referring Provider (PT) Alroy Dust, L.Marlou Sa, MD      Timed Up and Go Test   TUG Normal TUG    Normal TUG (seconds) 13.13    TUG Comments with use of SPC      Functional Gait  Assessment   Gait assessed  Yes    Gait Level Surface Walks 20 ft in less than 7 sec but greater than 5.5 sec, uses assistive device, slower speed, mild gait deviations, or deviates 6-10 in outside of the 12 in walkway width.    Change in Gait Speed Able to change  speed, demonstrates mild gait deviations, deviates 6-10 in outside of the 12 in walkway width, or no gait deviations, unable to achieve a major change in velocity, or uses a change in velocity, or uses an assistive device.    Gait with Horizontal Head Turns Performs head turns smoothly with slight change in gait velocity (eg, minor disruption to smooth gait path), deviates 6-10 in outside 12 in walkway width, or uses an assistive device.    Gait with Vertical Head Turns Performs task with slight change in gait velocity (eg, minor disruption to smooth gait path), deviates 6 - 10 in outside 12 in walkway width or uses assistive device    Gait and Pivot Turn Pivot turns safely within 3 sec and stops quickly with no loss of balance.    Step Over Obstacle Is able to step over one shoe box (4.5 in total height) but must slow down and adjust steps to clear box safely. May require verbal cueing.    Gait with Narrow Base of Support Ambulates less than 4 steps heel to toe or cannot perform without assistance.    Gait with  Eyes Closed Walks 20 ft, slow speed, abnormal gait pattern, evidence for imbalance, deviates 10-15 in outside 12 in walkway width. Requires more than 9 sec to ambulate 20 ft.    Ambulating Backwards Walks 20 ft, uses assistive device, slower speed, mild gait deviations, deviates 6-10 in outside 12 in walkway width.    Steps Alternating feet, must use rail.    Total Score 17    FGA comment: 17/30              [facility] PT Treatment/Exercise - 07/04/21 0001       Ambulation/Gait   Ambulation/Gait Yes    Ambulation/Gait Assistance 5: Supervision;4: Min guard    Ambulation/Gait Assistance Details completed ambulation outdoors on various unlevel surfaces x 600 ft with use of SPC (with quad tip). CGA intermittent due to imbalance with unlevel surface and incline/decline, Supervision otherwise, cues to widen BOS.    Ambulation Distance (Feet) 600 Feet    Assistive device Straight cane    with quad tip   Gait Pattern Step-through pattern;Ataxic;Decreased stride length    Ambulation Surface Level;Indoor;Unlevel;Outdoor      Exercises   Exercises Knee/Hip      Knee/Hip Exercises: Aerobic   Nustep NuStep with BUE/BLE's level 4.0 x 6 minutes with goal >/= 70 steps per minute for strengthening and activity tolerance.               PT Education - 07/04/21 0936     Education Details Progress toward LTGs; Going on hold until patient follow up with Neuologist    Person(s) Educated Patient    Methods Explanation    Comprehension Verbalized understanding              PT Short Term Goals - 06/13/21 1024       PT SHORT TERM GOAL #1   Title = LTGs               PT Long Term Goals - 07/04/21 0945       PT LONG TERM GOAL #1   Title Pt will be independent with HEP to address cordination, strength, and ambualtion deficits (Target Date: 07/18/21)    Baseline reports independence with HEP, completing 4x/weekly; will benefit from progressive HEP    Time 4    Period Weeks    Status On-going    Target Date 07/18/21      PT LONG TERM GOAL #2   Title Pt will improve FGA to >/=21/30 in order to indicate dec fall risk.    Baseline 04/17/2021 FGA 13/30; 16/30 on 12/23; 17/30    Time 4    Period Weeks    Status Not Met      PT LONG TERM GOAL #3   Title Pt will ambulate x 500' outdoors on unlevel surfaces with LRAD at S level to indicate safe community mobility    Baseline 400 ft supervision level indoors, unable to ambulate outdoors due to weather, CGA on unlevel indoors; 600 ft outdoors with [facility] and CGA required.    Time 4    Period Weeks    Status Not Met      PT LONG TERM GOAL #4   Title Patient will improve TUG with SPC to </= 12 seconds    Baseline 13.87; 13.13 secs with SPC    Time 4    Period Weeks    Status Not Met  Plan - 07/04/21 0941     Clinical Impression Statement Completed assesment of patient's progress toward  LTGs. Patient made some progress on TUG time and FGA but not to goal level at this time. Plan to place patient on hold until can be further evaluated by Neurology. Plans to return to PT services if needed after neurologist assesment, if not needed will discharge paitent at that time. Patient verbalize agreement.    Personal Factors and Comorbidities Age;Comorbidity 1;Comorbidity 2;Comorbidity 3+;Time since onset of injury/illness/exacerbation;Past/Current Experience    Comorbidities COPD, HTN, anxiety    Examination-Activity Limitations Locomotion Level;Squat;Stand;Stairs;Transfers    Stability/Clinical Decision Making Evolving/Moderate complexity    Rehab Potential Good    PT Frequency 2x / week    PT Duration 4 weeks    PT Treatment/Interventions ADLs/Self Care Home Management;Moist Heat;Gait training;Stair training;Functional mobility training;Therapeutic activities;Therapeutic exercise;Balance training;Neuromuscular re-education;Patient/family education;Manual techniques;Passive range of motion;Taping;Dry needling    PT Next Visit Plan Update from Neurology? Continue with gait training with quad tip cane, obstacles, stairs, curb/ramp with quad tip cane, LE/hip strength, and dynamic/static balance on compliant surfaces with narrow BOS and EO/EC. Maybe try tall kneeling/quadruped?    PT Home Exercise Plan [phone]; Access Code [email or web address]    Consulted and Agree with Plan of Care Patient             Patient will benefit from skilled therapeutic intervention in order to improve the following deficits and impairments:  Abnormal gait, Difficulty walking, Obesity, Decreased activity tolerance, Pain, Decreased balance, Decreased strength, Postural dysfunction  Visit Diagnosis: Muscle weakness (generalized)  Other abnormalities of gait and mobility  Difficulty in walking, not elsewhere classified  Other lack of coordination     Problem List Patient Active Problem List   Diagnosis Date  Noted   Gastroesophageal reflux disease    COPD mixed type ([place])    Hypertension 05/14/2017   Depression with anxiety 05/14/2017   Multifocal pneumonia 05/14/2017   Current smoker 05/14/2017    Jones Bales, PT, DPT 07/04/2021, 10:09 AM  [facility] [address] [place], [place], [address] Phone: [phone]   Fax:  [phone]  Name: Molly [name] MRN: [id number] Date of Birth: 01-20-1956  PHYSICAL THERAPY DISCHARGE SUMMARY  Visits from Start of Care: 29  Current functional level related to goals / functional outcomes: See clinical impression and goals   Remaining deficits: See clinical impression and goals   Education / Equipment: HEP   Patient agrees to discharge. Patient goals were partially met. Patient is being discharged due to not returning since the last visit.

## 2021-07-07 ENCOUNTER — Ambulatory Visit: Payer: Medicare Other

## 2021-07-11 ENCOUNTER — Ambulatory Visit: Payer: Medicare Other | Admitting: Physical Therapy

## 2021-08-01 ENCOUNTER — Encounter: Payer: Self-pay | Admitting: Neurology

## 2021-08-01 ENCOUNTER — Ambulatory Visit (INDEPENDENT_AMBULATORY_CARE_PROVIDER_SITE_OTHER): Payer: Medicare Other | Admitting: Neurology

## 2021-08-01 ENCOUNTER — Other Ambulatory Visit: Payer: Self-pay

## 2021-08-01 VITALS — BP 160/98 | HR 101 | Ht 63.0 in | Wt 149.0 lb

## 2021-08-01 DIAGNOSIS — M48061 Spinal stenosis, lumbar region without neurogenic claudication: Secondary | ICD-10-CM

## 2021-08-01 DIAGNOSIS — R2681 Unsteadiness on feet: Secondary | ICD-10-CM

## 2021-08-01 NOTE — Progress Notes (Signed)
[facility] Clinic Note - Initial Visit   Date: 08/01/21  Molly [name] MRN: [id number] DOB: 03-23-1956   Dear Dr. Clovis [name]:  Thank you for your kind referral of Molly [name] for consultation of unsteady gait and falls. Although her history is well known to you, please allow [place] to reiterate it for the purpose of our medical record. The patient was accompanied to the clinic by self.    History of Present Illness: Molly [name] is a 66 y.o. right-handed female with diabetes mellitus (HbA1c 7.7), COPD, anxiety, hypertension, and tobacco use presenting for evaluation of unsteady gait and falls.   Starting around 2022, she began having problems with imbalance. She complains of bilateral leg pain, described as achy pain in the thighs.  It tends to occur when she lays down.  She denies exertional leg pain.  The no numbness or tingling of the feet or legs.  She has chronic low back pain.  She has fallen 5-10 times over the past year.  She has completed physical therapy and does not feel he has significantly helped.  She was recommended to start using a cane so has been doing this for the past 6 months.  Most of her falls have occurred at home when she is not using her cane.  She lives at home with ex-husband, sister, and granddaughter.  She has not been driving for many years because of anxiety.   Out-side paper records, electronic medical record, and images have been reviewed where available and summarized as:  Lab Results  Component Value Date   HGBA1C 6.4 (H) 02/16/2018   No results found for: [id number] Lab Results  Component Value Date   TSH 1.030 02/16/2018   No results found for: ESRSEDRATE, POCTSEDRATE  Past Medical History:  Diagnosis Date   Anxiety    COPD (chronic obstructive pulmonary disease) (H[facility])    Diabetes mellitus without complication (H[facility])    Heart murmur    dx at age 70   Hypertension     Past Surgical History:  Procedure  Laterality Date   NECK SURGERY       Medications:  Outpatient Encounter Medications as of 08/01/2021  Medication Sig   albuterol (PROVENTIL HFA;VENTOLIN HFA) 108 (90 Base) MCG/ACT inhaler Inhale 2 puffs into the lungs every 6 (six) hours as needed for wheezing or shortness of breath.   aspirin EC 81 MG tablet Take 81 mg by mouth daily.   atorvastatin (LIPITOR) 40 MG tablet Take 40 mg by mouth daily.   busPIRone (BUSPAR) 15 MG tablet Take 15 mg by mouth 2 (two) times daily.   clonazePAM (KLONOPIN) 0.5 MG tablet Take 0.5 mg by mouth 2 (two) times daily as needed for anxiety.   cyclobenzaprine (FLEXERIL) 10 MG tablet Take 10 mg by mouth 3 (three) times daily as needed for muscle spasms.   FLUoxetine (PROZAC) 20 MG capsule Take 60 mg by mouth daily.   telmisartan (MICARDIS) 40 MG tablet Take 40 mg by mouth daily.   tiotropium (SPIRIVA HANDIHALER) 18 MCG inhalation capsule Place 1 capsule (18 mcg total) into inhaler and inhale daily.   zolpidem (AMBIEN) 10 MG tablet Take 10 mg by mouth at bedtime as needed for sleep.   HYDROcodone-acetaminophen (NORCO/VICODIN) 5-325 MG tablet Take 1 tablet by mouth every 6 (six) hours as needed for moderate pain. (Patient not taking: Reported on 08/01/2021)   No facility-administered encounter medications on file as of 08/01/2021.    Allergies:  Allergies  Allergen Reactions   Penicillins Hives    Family History: Family History  Problem Relation Age of Onset   Heart failure Mother    Hypertension Mother    Diabetes Mother    Heart failure Father    Sudden Cardiac Death Neg Hx     Social History: Social History   Tobacco Use   Smoking status: Every Day    Packs/day: 0.50    Years: 50.00    Pack years: 25.00    Types: Cigarettes   Smokeless tobacco: Never  Substance Use Topics   Alcohol use: Not Currently   Drug use: No   Social History   Social History Narrative   Son has passed away 2015-11-09      Right Handed   Lives in a one story  mobile home     Vital Signs:  BP (!) 160/98    Pulse (!) 101    Ht 5\' 3"  (1.6 m)    Wt 149 lb (67.6 kg)    SpO2 96%    BMI 26.39 kg/m    Neurological Exam: MENTAL STATUS including orientation to time, place, person, recent and remote memory, attention span and concentration, language, and fund of knowledge is normal.  Speech is not dysarthric.  CRANIAL NERVES: II:  No visual field defects.  III-IV-VI: Pupils equal round and reactive to light.  Normal conjugate, extra-ocular eye movements in all directions of gaze.  No nystagmus.  No ptosis.   V:  Normal facial sensation.    VII:  Normal facial symmetry and movements.   VIII:  Normal hearing and vestibular function.   IX-X:  Normal palatal movement.   XI:  Normal shoulder shrug and head rotation.   XII:  Normal tongue strength and range of motion, no deviation or fasciculation.  MOTOR:  No atrophy, fasciculations or abnormal movements.  No pronator drift.   Upper Extremity:  Right  Left  Deltoid  5/5   5/5   Biceps  5/5   5/5   Triceps  5/5   5/5   Wrist extensors  5/5   5/5   Wrist flexors  5/5   5/5   Finger extensors  5/5   5/5   Finger flexors  5/5   5/5   Dorsal interossei  5/5   5/5   Abductor pollicis  5/5   5/5   Tone ([place]worth scale)  0  0   Lower Extremity:  Right  Left  Hip flexors  5/5   5/5   Hip extensors  5/5   5/5   Adductor 5/5  5/5  Abductor 5/5  5/5  Knee flexors  5/5   5/5   Knee extensors  5/5   5/5   Dorsiflexors  5/5   5/5   Plantarflexors  5/5   5/5   Toe extensors  5/5   5/5   Toe flexors  5/5   5/5   Tone ([place]worth scale)  0  0   MSRs:  Right        Left                  brachioradialis 2+  2+  biceps 2+  2+  triceps 2+  2+  patellar 3+  3+  ankle jerk 2+  2+  Hoffman no  no  plantar response down  down   SENSORY:  Normal and symmetric perception of light touch, pinprick, vibration, and temperature.  Romberg's sign absent.   COORDINATION/GAIT:  Normal finger-to- nose-finger.   Intact  rapid alternating movements bilaterally.  Able to rise from a chair without using arms.  Gait appears mildly ataxic, assisted with cane.   IMPRESSION: Unsteady gait in the setting of chronic low back pain, with no improvement with PT warrants further imaging.  Her exam is only notable for brisk patellar reflexes so I will check MRI lumbar spine to evaluate for lumbar canal stenosis.  If there is no compressive pathology, further imaging of the may be indicated.  There is no exam features to suggest peripheral neuropathy, although she is diabetic and cannot be completely excluded.  MRI brain and/or EMG of the legs can be considered going forward.   Fall precautions discussed, always use cane.  Further recommendations pending results.  Thank you for allowing me to participate in patient's care.  If I can answer any additional questions, I would be pleased to do so.    Sincerely,    Letesha Klecker K. Allena Katz, DO

## 2021-08-01 NOTE — Patient Instructions (Signed)
MRI lumbar spine without contrast    

## 2021-08-06 DIAGNOSIS — M4807 Spinal stenosis, lumbosacral region: Secondary | ICD-10-CM | POA: Diagnosis not present

## 2021-08-06 DIAGNOSIS — M5127 Other intervertebral disc displacement, lumbosacral region: Secondary | ICD-10-CM | POA: Diagnosis not present

## 2021-08-06 DIAGNOSIS — M47816 Spondylosis without myelopathy or radiculopathy, lumbar region: Secondary | ICD-10-CM | POA: Diagnosis not present

## 2021-08-07 DIAGNOSIS — I10 Essential (primary) hypertension: Secondary | ICD-10-CM | POA: Diagnosis not present

## 2021-08-07 DIAGNOSIS — J449 Chronic obstructive pulmonary disease, unspecified: Secondary | ICD-10-CM | POA: Diagnosis not present

## 2021-08-07 DIAGNOSIS — G4700 Insomnia, unspecified: Secondary | ICD-10-CM | POA: Diagnosis not present

## 2021-08-07 DIAGNOSIS — E7800 Pure hypercholesterolemia, unspecified: Secondary | ICD-10-CM | POA: Diagnosis not present

## 2021-08-07 DIAGNOSIS — R1310 Dysphagia, unspecified: Secondary | ICD-10-CM | POA: Diagnosis not present

## 2021-08-07 DIAGNOSIS — E1169 Type 2 diabetes mellitus with other specified complication: Secondary | ICD-10-CM | POA: Diagnosis not present

## 2021-08-07 DIAGNOSIS — R5383 Other fatigue: Secondary | ICD-10-CM | POA: Diagnosis not present

## 2021-08-08 ENCOUNTER — Telehealth: Payer: Self-pay | Admitting: Neurology

## 2021-08-08 NOTE — Telephone Encounter (Signed)
Called patient and left a message for a call back.  

## 2021-08-08 NOTE — Telephone Encounter (Signed)
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  I would like to check EMG of the legs to evaluate her symptoms further. Thanks.   MRI lumbar spine 08/06/2021 performed at [facility]: 1.  Degenerative changes mid-lower lumbar spine. No focal disc herniation. At L5-S1, there is a generalized disc osteophyte complex resulting in severe narrowing of the right lateral recess which could affect the traversing right S1 nerve root. There is also moderate bilateral foraminal stenosis at this level. Spinal canal and neural foramina otherwise appear widely patent.  2.  No acute fracture.

## 2021-08-11 ENCOUNTER — Telehealth: Payer: Self-pay | Admitting: Neurology

## 2021-08-11 ENCOUNTER — Other Ambulatory Visit: Payer: Self-pay

## 2021-08-11 DIAGNOSIS — R2681 Unsteadiness on feet: Secondary | ICD-10-CM

## 2021-08-11 DIAGNOSIS — M48061 Spinal stenosis, lumbar region without neurogenic claudication: Secondary | ICD-10-CM

## 2021-08-11 NOTE — Telephone Encounter (Signed)
Called patient and she is agreeable to getting EMG done I am putting the order in and sending to front desk for scheduling

## 2021-08-11 NOTE — Telephone Encounter (Signed)
Patient called and stated she had received a phone call Friday afternoon to call here. She was returning the call.

## 2021-08-18 DIAGNOSIS — U071 COVID-19: Secondary | ICD-10-CM | POA: Diagnosis not present

## 2021-09-11 ENCOUNTER — Encounter: Payer: Medicare Other | Admitting: Neurology

## 2021-10-29 DIAGNOSIS — H52223 Regular astigmatism, bilateral: Secondary | ICD-10-CM | POA: Diagnosis not present

## 2021-10-29 DIAGNOSIS — H53413 Scotoma involving central area, bilateral: Secondary | ICD-10-CM | POA: Diagnosis not present

## 2021-10-29 DIAGNOSIS — E119 Type 2 diabetes mellitus without complications: Secondary | ICD-10-CM | POA: Diagnosis not present

## 2021-10-29 DIAGNOSIS — H35033 Hypertensive retinopathy, bilateral: Secondary | ICD-10-CM | POA: Diagnosis not present

## 2021-10-29 DIAGNOSIS — H5203 Hypermetropia, bilateral: Secondary | ICD-10-CM | POA: Diagnosis not present

## 2021-10-29 DIAGNOSIS — H524 Presbyopia: Secondary | ICD-10-CM | POA: Diagnosis not present

## 2021-10-29 DIAGNOSIS — H353132 Nonexudative age-related macular degeneration, bilateral, intermediate dry stage: Secondary | ICD-10-CM | POA: Diagnosis not present

## 2021-12-24 DIAGNOSIS — H35033 Hypertensive retinopathy, bilateral: Secondary | ICD-10-CM | POA: Diagnosis not present

## 2021-12-24 DIAGNOSIS — H53413 Scotoma involving central area, bilateral: Secondary | ICD-10-CM | POA: Diagnosis not present

## 2021-12-24 DIAGNOSIS — H353132 Nonexudative age-related macular degeneration, bilateral, intermediate dry stage: Secondary | ICD-10-CM | POA: Diagnosis not present

## 2021-12-24 DIAGNOSIS — E119 Type 2 diabetes mellitus without complications: Secondary | ICD-10-CM | POA: Diagnosis not present

## 2022-02-09 DIAGNOSIS — J449 Chronic obstructive pulmonary disease, unspecified: Secondary | ICD-10-CM | POA: Diagnosis not present

## 2022-02-09 DIAGNOSIS — E1169 Type 2 diabetes mellitus with other specified complication: Secondary | ICD-10-CM | POA: Diagnosis not present

## 2022-02-09 DIAGNOSIS — I10 Essential (primary) hypertension: Secondary | ICD-10-CM | POA: Diagnosis not present

## 2022-02-09 DIAGNOSIS — R1310 Dysphagia, unspecified: Secondary | ICD-10-CM | POA: Diagnosis not present

## 2022-02-09 DIAGNOSIS — M5459 Other low back pain: Secondary | ICD-10-CM | POA: Diagnosis not present

## 2022-02-09 DIAGNOSIS — E7800 Pure hypercholesterolemia, unspecified: Secondary | ICD-10-CM | POA: Diagnosis not present

## 2022-02-09 DIAGNOSIS — R2689 Other abnormalities of gait and mobility: Secondary | ICD-10-CM | POA: Diagnosis not present

## 2022-02-09 DIAGNOSIS — Z0000 Encounter for general adult medical examination without abnormal findings: Secondary | ICD-10-CM | POA: Diagnosis not present

## 2022-02-09 DIAGNOSIS — G4700 Insomnia, unspecified: Secondary | ICD-10-CM | POA: Diagnosis not present

## 2022-02-09 DIAGNOSIS — Z23 Encounter for immunization: Secondary | ICD-10-CM | POA: Diagnosis not present

## 2022-02-16 DIAGNOSIS — Z1211 Encounter for screening for malignant neoplasm of colon: Secondary | ICD-10-CM | POA: Diagnosis not present

## 2022-04-01 DIAGNOSIS — Z1231 Encounter for screening mammogram for malignant neoplasm of breast: Secondary | ICD-10-CM | POA: Diagnosis not present

## 2022-04-01 DIAGNOSIS — M85851 Other specified disorders of bone density and structure, right thigh: Secondary | ICD-10-CM | POA: Diagnosis not present

## 2022-04-01 DIAGNOSIS — M810 Age-related osteoporosis without current pathological fracture: Secondary | ICD-10-CM | POA: Diagnosis not present

## 2022-05-20 DIAGNOSIS — E1169 Type 2 diabetes mellitus with other specified complication: Secondary | ICD-10-CM | POA: Diagnosis not present

## 2022-05-20 DIAGNOSIS — R300 Dysuria: Secondary | ICD-10-CM | POA: Diagnosis not present

## 2022-05-20 DIAGNOSIS — F17200 Nicotine dependence, unspecified, uncomplicated: Secondary | ICD-10-CM | POA: Diagnosis not present

## 2022-07-17 ENCOUNTER — Ambulatory Visit (INDEPENDENT_AMBULATORY_CARE_PROVIDER_SITE_OTHER): Payer: 59

## 2022-07-17 ENCOUNTER — Encounter: Payer: Self-pay | Admitting: Cardiology

## 2022-07-17 ENCOUNTER — Telehealth: Payer: Self-pay | Admitting: Cardiology

## 2022-07-17 ENCOUNTER — Ambulatory Visit: Payer: 59 | Attending: Cardiology | Admitting: Cardiology

## 2022-07-17 VITALS — BP 144/82 | HR 96 | Ht 64.0 in | Wt 140.0 lb

## 2022-07-17 DIAGNOSIS — R002 Palpitations: Secondary | ICD-10-CM

## 2022-07-17 DIAGNOSIS — I10 Essential (primary) hypertension: Secondary | ICD-10-CM

## 2022-07-17 DIAGNOSIS — E7800 Pure hypercholesterolemia, unspecified: Secondary | ICD-10-CM

## 2022-07-17 NOTE — Telephone Encounter (Signed)
Patient called to report as requested by Dr. Radford Pax that her oral surgeon is Dr. Bosie Clos and [place], [phone]

## 2022-07-17 NOTE — Addendum Note (Signed)
Addended by: Joni Reining on: 07/17/2022 09:18 AM   Modules accepted: Orders

## 2022-07-17 NOTE — Patient Instructions (Addendum)
Medication Instructions:  Your physician recommends that you continue on your current medications as directed. Please refer to the Current Medication list given to you today.  *If you need a refill on your cardiac medications before your next appointment, please call your pharmacy*   Lab Work: None.  If you have labs (blood work) drawn today and your tests are completely normal, you will receive your results only by: [place] (if you have MyChart) OR A paper copy in the mail If you have any lab test that is abnormal or we need to change your treatment, we will call you to review the results.   Testing/Procedures: [place]- Long Term Monitor Instructions  Your physician has requested you wear a ZIO patch monitor for 14 days.  This is a single patch monitor. [employer] supplies one patch monitor per enrollment. Additional stickers are not available. Please do not apply patch if you will be having a Nuclear Stress Test,  Echocardiogram, Cardiac CT, MRI, or Chest Xray during the period you would be wearing the  monitor. The patch cannot be worn during these tests. You cannot remove and re-apply the  ZIO XT patch monitor.  Your ZIO patch monitor will be mailed 3 day USPS to your address on file. It may take 3-5 days  to receive your monitor after you have been enrolled.  Once you have received your monitor, please review the enclosed instructions. Your monitor  has already been registered assigning a specific monitor serial # to you.  Billing and Patient Assistance Program Information  We have supplied [employer] with any of your insurance information on file for billing purposes. [employer] offers a sliding scale Patient Assistance Program for patients that do not have  insurance, or whose insurance does not completely cover the cost of the ZIO monitor.  You must apply for the Patient Assistance Program to qualify for this discounted rate.  To apply, please call [employer] at [phone],  select option 4, select option 2, ask to apply for  Patient Assistance Program. Theodore Demark will ask your household income, and how many people  are in your household. They will quote your out-of-pocket cost based on that information.  [employer] will also be able to set up a 22-month interest-free payment plan if needed.  Applying the monitor   Shave hair from upper left chest.  Hold abrader disc by orange tab. Rub abrader in 40 strokes over the upper left chest as  indicated in your monitor instructions.  Clean area with 4 enclosed alcohol pads. Let dry.  Apply patch as indicated in monitor instructions. Patch will be placed under collarbone on left  side of chest with arrow pointing upward.  Rub patch adhesive wings for 2 minutes. Remove white label marked "1". Remove the white  label marked "2". Rub patch adhesive wings for 2 additional minutes.  While looking in a mirror, press and release button in center of patch. A small green light will  flash 3-4 times. This will be your only indicator that the monitor has been turned on.  Do not shower for the first 24 hours. You may shower after the first 24 hours.  Press the button if you feel a symptom. You will hear a small click. Record Date, Time and  Symptom in the Patient Logbook.  When you are ready to remove the patch, follow instructions on the last 2 pages of Patient  Logbook. Stick patch monitor onto the last page of Patient Logbook.  Place Patient Logbook  in the blue and white box. Use locking tab on box and tape box closed  securely. The blue and white box has prepaid postage on it. Please place it in the mailbox as  soon as possible. Your physician should have your test results approximately 7 days after the  monitor has been mailed back to [facility].  Call [place] at [phone] if you have questions regarding  your ZIO XT patch monitor. Call them immediately if you see an orange light blinking on your   monitor.  If your monitor falls off in less than 4 days, contact our Monitor department at [phone].  If your monitor becomes loose or falls off after 4 days call [employer] at [phone] for  suggestions on securing your monitor    Follow-Up: At [facility], you and your health needs are our priority.  As part of our continuing mission to provide you with exceptional heart care, we have created designated Provider Care Teams.  These Care Teams include your primary Cardiologist (physician) and Advanced Practice Providers (APPs -  Physician Assistants and Nurse Practitioners) who all work together to provide you with the care you need, when you need it.  We recommend signing up for the patient portal called "MyChart".  Sign up information is provided on this After Visit Summary.  MyChart is used to connect with patients for Virtual Visits (Telemedicine).  Patients are able to view lab/test results, encounter notes, upcoming appointments, etc.  Non-urgent messages can be sent to your provider as well.   To learn more about what you can do with MyChart, go to [email or web address].    Your next appointment will be as needed depending on the results of your cardiac event monitor.   Provider:   Dr. Fransico Him, MD   Other Instructions Please check your records and call us with the name of your oral surgeon so we can investigate a cardiac event you reported to [place] that caused your oral surgery to be canceled.

## 2022-07-17 NOTE — Progress Notes (Signed)
Cardiology CONSULT Note    Date:  07/17/2022   ID:  Molly [name], DOB 21-Dec-1955, MRN [id number]  PCP:  Alroy Dust, L.Marlou Sa, MD  Cardiologist:  Fransico Him, MD   Chief Complaint  Patient presents with   New Patient (Initial Visit)    Hypertension, hyperlipidemia, chest pain    History of Present Illness:  Molly [name] is a 67 y.o. female who is being seen today for the evaluation to reestablish cardiac care at the request of Alroy Dust, L.Marlou Sa, MD.  This is a 67yo female with a hx of COPD, HTN, diabetes mellitus, hyperlipidemia tobacco use and anxiety.  She was seen remotely in the past by Dr. Claiborne Billings for chest pain and nuclear stress test showed no ischemia in 2019. She recently went to the oral surgeon and apparently her heart rate went up over 100bpm and had to stop the procedure before proceeding with her oral surgery.    She has chronic DOE with her COPD if she overexerts herself.  She denies any chest pain or pressure, PND, orthopnea, LE edema, dizziness or syncope. Occasionally she will notice problems with her heart fluttering.   She is compliant with her meds and is tolerating meds with no SE.    Past Medical History:  Diagnosis Date   Anxiety    COPD (chronic obstructive pulmonary disease) ([place])    Diabetes mellitus without complication (HCC)    Heart murmur    dx at age 67   Hypertension     Past Surgical History:  Procedure Laterality Date   NECK SURGERY      Current Medications: Current Meds  Medication Sig   albuterol (PROVENTIL HFA;VENTOLIN HFA) 108 (90 Base) MCG/ACT inhaler Inhale 2 puffs into the lungs every 6 (six) hours as needed for wheezing or shortness of breath.   aspirin EC 81 MG tablet Take 81 mg by mouth daily.   atorvastatin (LIPITOR) 40 MG tablet Take 40 mg by mouth daily.   busPIRone (BUSPAR) 15 MG tablet Take 15 mg by mouth 2 (two) times daily.   clonazePAM (KLONOPIN) 0.5 MG tablet Take 0.5 mg by mouth 2 (two) times daily as needed for  anxiety.   cyclobenzaprine (FLEXERIL) 10 MG tablet Take 10 mg by mouth 3 (three) times daily as needed for muscle spasms.   FLUoxetine (PROZAC) 20 MG capsule Take 60 mg by mouth daily.   HYDROcodone-acetaminophen (NORCO/VICODIN) 5-325 MG tablet Take 1 tablet by mouth every 6 (six) hours as needed for moderate pain.   telmisartan (MICARDIS) 40 MG tablet Take 40 mg by mouth daily.   zolpidem (AMBIEN) 10 MG tablet Take 10 mg by mouth at bedtime as needed for sleep.    Allergies:   Penicillins   Social History   Socioeconomic History   Marital status: Divorced    Spouse name: Not on file   Number of children: 1   Years of education: Not on file   Highest education level: Not on file  Occupational History   Occupation: Disabled due to back problems.  Tobacco Use   Smoking status: Every Day    Packs/day: 0.50    Years: 50.00    Total pack years: 25.00    Types: Cigarettes   Smokeless tobacco: Never  Substance and Sexual Activity   Alcohol use: Not Currently   Drug use: No   Sexual activity: Not on file  Other Topics Concern   Not on file  Social History Narrative   Son has  passed away 2017      Right Handed   Lives in a one story mobile home    Social Determinants of Health   Financial Resource Strain: Not on file  Food Insecurity: Not on file  Transportation Needs: Not on file  Physical Activity: Not on file  Stress: Not on file  Social Connections: Not on file     Family History:  The patient's family history includes Diabetes in her mother; Heart failure in her father and mother; Hypertension in her mother.   ROS:   Please see the history of present illness.    ROS All other systems reviewed and are negative.      No data to display             PHYSICAL EXAM:   VS:  BP (!) 144/82   Pulse 96   Ht 5\' 4"  (1.626 m)   Wt 140 lb (63.5 kg)   SpO2 91%   BMI 24.03 kg/m    GEN: Well nourished, well developed, in no acute distress  HEENT: normal  Neck: no  JVD, carotid bruits, or masses Cardiac: RRR; no murmurs, rubs, or gallops,no edema.  Intact distal pulses bilaterally.  Respiratory:  clear to auscultation bilaterally, normal work of breathing GI: soft, nontender, nondistended, + BS MS: no deformity or atrophy  Skin: warm and dry, no rash Neuro:  Alert and Oriented x 3, Strength and sensation are intact Psych: euthymic mood, full affect  Wt Readings from Last 3 Encounters:  07/17/22 140 lb (63.5 kg)  08/01/21 149 lb (67.6 kg)  12/19/19 155 lb (70.3 kg)      Studies/Labs Reviewed:   EKG:  EKG is ordered today.  The ekg ordered today demonstrates NSR at 96bpm with LAE and no ST changes  Recent Labs: No results found for requested labs within last 365 days.   Lipid Panel No results found for: "CHOL", "TRIG", "HDL", "CHOLHDL", "VLDL", "LDLCALC", "LDLDIRECT"      Additional studies/ records that were reviewed today include:  EKG    ASSESSMENT:    1. Primary hypertension   2. Pure hypercholesterolemia   3. Palpitations      PLAN:  In order of problems listed above:  HTN -BP borderline controlled on exam -Continue prescription drug management with telmisartan 40 mg daily with as needed refills -I have personally reviewed and interpreted outside labs performed by patient's PCP which showed serum creatinine 0.58 and potassium 4.4 on 02/09/2022  2.  HLD -LDL goal less than 100 -Continue prescription drug management atorvastatin 40 mg daily -I have personally reviewed and interpreted outside labs performed by patient's PCP which showed LDL 44 and HDL 73 on 02/09/2022  3.  Palpitations -recently was seen by oral surgery and her HR was fast and the procedure was stopped and she was told she would have to see a cardiologist first.  She does not know how fast her HR was.  -She was told to go to Urgent care yesterday after being released from oral surgeon and she decided not to go -I will get a copy of her records from the  oral surgeon>> -check a 2 week Ziopatch  Time Spent: 20 minutes total time of encounter, including 15 minutes spent in face-to-face patient care on the date of this encounter. This time includes coordination of care and counseling regarding above mentioned problem list. Remainder of non-face-to-face time involved reviewing chart documents/testing relevant to the patient encounter and documentation in the medical record.  I have independently reviewed documentation from referring provider  Medication Adjustments/Labs and Tests Ordered: Current medicines are reviewed at length with the patient today.  Concerns regarding medicines are outlined above.  Medication changes, Labs and Tests ordered today are listed in the Patient Instructions below.  There are no Patient Instructions on file for this visit.   Signed, Fransico Him, MD  07/17/2022 9:11 AM    [address], [place], [place]  [address] Phone: [phone]; Fax: [phone]

## 2022-07-17 NOTE — Progress Notes (Unsigned)
Enrolled patient for a 14 day Zio XT  monitor to be mailed to patients home  °

## 2022-07-21 NOTE — Addendum Note (Signed)
Addended by: Janan Halter F on: 07/21/2022 05:59 PM   Modules accepted: Orders

## 2022-07-21 NOTE — Telephone Encounter (Signed)
Spoke with Sharyn Lull at Dr. Bernie Covey office and requested notes to be faxed over.

## 2022-07-22 ENCOUNTER — Ambulatory Visit: Payer: Medicare Other | Admitting: General Practice

## 2022-07-22 DIAGNOSIS — R002 Palpitations: Secondary | ICD-10-CM | POA: Diagnosis not present

## 2022-07-23 DIAGNOSIS — J449 Chronic obstructive pulmonary disease, unspecified: Secondary | ICD-10-CM | POA: Diagnosis not present

## 2022-07-23 DIAGNOSIS — E119 Type 2 diabetes mellitus without complications: Secondary | ICD-10-CM | POA: Diagnosis not present

## 2022-07-23 DIAGNOSIS — R5383 Other fatigue: Secondary | ICD-10-CM | POA: Diagnosis not present

## 2022-07-23 DIAGNOSIS — R1310 Dysphagia, unspecified: Secondary | ICD-10-CM | POA: Diagnosis not present

## 2022-07-23 DIAGNOSIS — E7800 Pure hypercholesterolemia, unspecified: Secondary | ICD-10-CM | POA: Diagnosis not present

## 2022-07-23 DIAGNOSIS — I10 Essential (primary) hypertension: Secondary | ICD-10-CM | POA: Diagnosis not present

## 2022-07-23 DIAGNOSIS — E1169 Type 2 diabetes mellitus with other specified complication: Secondary | ICD-10-CM | POA: Diagnosis not present

## 2022-07-30 DIAGNOSIS — R002 Palpitations: Secondary | ICD-10-CM | POA: Diagnosis not present

## 2022-08-05 ENCOUNTER — Telehealth: Payer: Self-pay

## 2022-08-05 DIAGNOSIS — I10 Essential (primary) hypertension: Secondary | ICD-10-CM

## 2022-08-05 DIAGNOSIS — R002 Palpitations: Secondary | ICD-10-CM

## 2022-08-05 MED ORDER — METOPROLOL SUCCINATE ER 25 MG PO TB24: 25.0000 mg | ORAL_TABLET | Freq: Every day | ORAL | 3 refills | Status: DC

## 2022-08-05 NOTE — Telephone Encounter (Signed)
Left msg per DPR asking patient to call back for results.

## 2022-08-05 NOTE — Addendum Note (Signed)
Addended by: Joni Reining on: 08/05/2022 03:10 PM   Modules accepted: Orders

## 2022-08-05 NOTE — Telephone Encounter (Signed)
Patient returning call.

## 2022-08-05 NOTE — Telephone Encounter (Signed)
Called patient and reviewed results of heart monitor as well as Dr. Theodosia Blender recommendation for additional testing and to start Toprol. Patient agrees to plan, verbalizes understanding. Orders placed, labs, echo and extender appt scheduled.

## 2022-08-05 NOTE — Telephone Encounter (Signed)
-----   Message from Sueanne Margarita, MD sent at 07/31/2022  5:51 PM EST ----- Heart monitor showed mildly elevated heart rate at 101 bpm average and ranged from 87 to 122 bpm.  Please add Toprol-XL 25 mg daily.  Please have her come in for a TSH, bmet and magnesium levels.  Set up for 2D echocardiogram to assess LV function and follow-up with extender in 4 weeks

## 2022-08-10 ENCOUNTER — Ambulatory Visit: Payer: 59 | Attending: Cardiology

## 2022-08-10 DIAGNOSIS — R002 Palpitations: Secondary | ICD-10-CM

## 2022-08-10 LAB — TSH: TSH: 0.9 u[IU]/mL (ref 0.450–4.500)

## 2022-08-10 LAB — BASIC METABOLIC PANEL
BUN/Creatinine Ratio: 19 (ref 12–28)
BUN: 11 mg/dL (ref 8–27)
CO2: 28 mmol/L (ref 20–29)
Calcium: 9.1 mg/dL (ref 8.7–10.3)
Chloride: 100 mmol/L (ref 96–106)
Creatinine, Ser: 0.59 mg/dL (ref 0.57–1.00)
Glucose: 95 mg/dL (ref 70–99)
Potassium: 5 mmol/L (ref 3.5–5.2)
Sodium: 139 mmol/L (ref 134–144)
eGFR: 98 mL/min/{1.73_m2} (ref >59)

## 2022-08-10 LAB — MAGNESIUM: Magnesium: 1.8 mg/dL (ref 1.6–2.3)

## 2022-08-11 ENCOUNTER — Encounter: Payer: Self-pay | Admitting: Cardiology

## 2022-08-11 ENCOUNTER — Telehealth: Payer: Self-pay

## 2022-08-11 DIAGNOSIS — R002 Palpitations: Secondary | ICD-10-CM

## 2022-08-11 DIAGNOSIS — I10 Essential (primary) hypertension: Secondary | ICD-10-CM

## 2022-08-11 DIAGNOSIS — E7800 Pure hypercholesterolemia, unspecified: Secondary | ICD-10-CM

## 2022-08-11 MED ORDER — MAGNESIUM OXIDE -MG SUPPLEMENT 200 MG PO TABS: 200.0000 mg | ORAL_TABLET | Freq: Every day | ORAL | 3 refills | Status: DC

## 2022-08-11 NOTE — Telephone Encounter (Signed)
Patient aware of recommendations. Patient will come in on 08/18/22 for repeat lab work.

## 2022-08-11 NOTE — Telephone Encounter (Signed)
Error

## 2022-08-11 NOTE — Telephone Encounter (Signed)
-----   Message from Sueanne Margarita, MD sent at 08/11/2022  9:39 AM EST ----- Take Magnesium oxide 233m daily and repeat mag level in 1 week ----- Message ----- From: PMichaelyn Barter RN Sent: 08/11/2022   9:28 AM EST To: TSueanne Margarita MD  Patient aware of results. Patient stated she does not take a magnesium supplement.

## 2022-08-11 NOTE — Telephone Encounter (Signed)
-----   Message from Sueanne Margarita, MD sent at 08/11/2022  9:39 AM EST ----- Take Magnesium oxide 227m daily and repeat mag level in 1 week ----- Message ----- From: PMichaelyn Barter RN Sent: 08/11/2022   9:28 AM EST To: TSueanne Margarita MD  Patient aware of results. Patient stated she does not take a magnesium supplement.

## 2022-08-18 ENCOUNTER — Ambulatory Visit: Payer: 59 | Attending: Physician Assistant

## 2022-08-18 DIAGNOSIS — I10 Essential (primary) hypertension: Secondary | ICD-10-CM

## 2022-08-18 DIAGNOSIS — R002 Palpitations: Secondary | ICD-10-CM

## 2022-08-18 DIAGNOSIS — E7800 Pure hypercholesterolemia, unspecified: Secondary | ICD-10-CM

## 2022-08-19 LAB — MAGNESIUM: Magnesium: 1.9 mg/dL (ref 1.6–2.3)

## 2022-08-21 ENCOUNTER — Telehealth: Payer: Self-pay

## 2022-08-21 MED ORDER — MAGNESIUM OXIDE 400 MG PO CAPS: 400.0000 mg | ORAL_CAPSULE | ORAL | 3 refills | Status: DC

## 2022-08-21 NOTE — Telephone Encounter (Signed)
Reviewed lab results, discussed Dr. Jacolyn Reedy recommendation to increase magnesium oxide supplement to 400 mg dose M-W-F and 200 mg every other day. Patient verbalizes understanding and agrees to plan. She states she has plenty of Magnesium oxide on hand in 400 mg and 200 mg dosages.

## 2022-08-21 NOTE — Telephone Encounter (Signed)
-----   Message from Nuala Alpha, LPN sent at [id number]  8:16 AM EST -----  ----- Message ----- From: Freada Bergeron, MD Sent: 08/19/2022   6:47 PM EST To: Rebeca Alert [facility] Triage  Her magnesium level improved to 1.9. Her goal is around 2. Would just have her take an extra '200mg'$  of magnesium ('400mg'$  total) on M, W, F and '200mg'$  the other days.

## 2022-09-04 ENCOUNTER — Ambulatory Visit (HOSPITAL_COMMUNITY): Payer: 59 | Attending: Internal Medicine

## 2022-09-04 DIAGNOSIS — I10 Essential (primary) hypertension: Secondary | ICD-10-CM | POA: Insufficient documentation

## 2022-09-04 LAB — ECHOCARDIOGRAM COMPLETE
Area-P 1/2: 3.34 cm2
Calc EF: 63 %
S' Lateral: 2.1 cm
Single Plane A2C EF: 63.3 %
Single Plane A4C EF: 65.4 %

## 2022-09-08 ENCOUNTER — Telehealth: Payer: Self-pay

## 2022-09-08 NOTE — Telephone Encounter (Signed)
Called and spoke to patient regarding Telmisartan. Patient says she is still taking this medication and her PCP at [place] refills it for her.

## 2022-09-08 NOTE — Telephone Encounter (Signed)
-----   Message from Delight Stare, CPhT sent at 08/07/2022  2:52 PM EST ----- Regarding: Medication Adhenrece Omer Jack! Ms. Clapsaddle has failed the adherence measure for heart meds.  I just noticed she has and upcoming appointment with you guys next week, so thought I would let you know.  Per [facility] she has not filled her Telmisartan since August.  Let us know if we can help in anyway!  Thanks Lelon Huh, [facility] [phone]

## 2022-09-08 NOTE — Telephone Encounter (Signed)
-----   Message from Sueanne Margarita, MD sent at 09/05/2022  5:47 PM EDT ----- 2D echo showed normal heart function EF 60 to 65% with increased stiffness of the heart muscle normal for her age.  No echo findings for hypertrophic obstructive cardiomyopathy

## 2022-09-08 NOTE — Telephone Encounter (Signed)
Reviewed patient's echo results, patient states understanding of normal ejection fraction and increased stiffness of heart muscle normal for age.

## 2022-09-20 NOTE — Progress Notes (Unsigned)
Office Visit    Patient Name: Molly [name] Date of Encounter: 09/21/2022  PCP:  Alroy Dust, L.Marlou Sa, [facility]  Cardiologist:  None  Advanced Practice Provider:  No care team member to display Electrophysiologist:  None   HPI    Molly [name] is a 67 y.o. female with a past medical history of COPD, diabetes mellitus, heart murmur (at age 28 diagnosis), hypertension, anxiety presents today for follow-up.  She was seen remotely in the past by Dr. Claiborne Billings for chest pain and nuclear stress test showed no ischemia in 2019.  Recently went to see oral surgeon and apparently her heart rate was over 100 bpm and they had to stop the procedure.  She has chronic DOE with COPD when she overexerts herself.  Denies any chest pain or pressure, PND, orthopnea, lower extremity edema, dizziness or syncope when she was last seen by Dr. Radford Pax January 2024.  Today, she tells me that she does not have any new symptoms since she saw Dr. Radford Pax back in January.  She occasionally does have some palpitations about once a week that occur mostly when she is watching TV in the evenings.  She takes her magnesium at night since it makes her sleepy.  She has been compliant with her metoprolol and we have actually discussed increasing this today.  She does meet 4 METS without any issue.  She tells me she has some dental work that needs to be completed.  We discussed the need for an official clearance form.  She had a lipid panel done last summer which looked okay but she will be due in August for an updated panel through her primary.  Since she is on supplementation we will plan to repeat a magnesium level today.  Recent ZIO monitor and echocardiogram both looked good and we reviewed these today.  Reports no shortness of breath nor dyspnea on exertion. Reports no chest pain, pressure, or tightness. No edema, orthopnea, PND.   Past Medical History    Past Medical History:  Diagnosis Date    Anxiety    COPD (chronic obstructive pulmonary disease)    Diabetes mellitus without complication    Heart murmur    dx at age 24   Hypertension    Past Surgical History:  Procedure Laterality Date   NECK SURGERY      Allergies  Allergies  Allergen Reactions   Penicillins Hives     EKGs/Labs/Other Studies Reviewed:   The following studies were reviewed today:  Echo 09/04/22 IMPRESSIONS     1. No LVOT obstruction. No SAM. Left ventricular ejection fraction, by  estimation, is 60 to 65%. The left ventricle has normal function. The left  ventricle has no regional wall motion abnormalities. Left ventricular  diastolic parameters are consistent  with Grade I diastolic dysfunction (impaired relaxation).   2. Right ventricular systolic function is normal. The right ventricular  size is normal.   3. No evidence of mitral valve regurgitation.   4. The aortic valve is normal in structure. Aortic valve regurgitation is  not visualized.   5. The inferior vena cava is normal in size with greater than 50%  respiratory variability, suggesting right atrial pressure of 3 mmHg.   FINDINGS   Left Ventricle: No LVOT obstruction. No SAM. Left ventricular ejection  fraction, by estimation, is 60 to 65%. The left ventricle has normal  function. The left ventricle has no regional wall motion abnormalities.  The left  ventricular internal cavity size   was normal in size. There is no left ventricular hypertrophy. Left  ventricular diastolic parameters are consistent with Grade I diastolic  dysfunction (impaired relaxation).   Right Ventricle: The right ventricular size is normal. Right ventricular  systolic function is normal.   Left Atrium: Left atrial size was normal in size.   Right Atrium: Right atrial size was normal in size.   Pericardium: There is no evidence of pericardial effusion.   Mitral Valve: No evidence of mitral valve regurgitation.   Tricuspid Valve: Tricuspid valve  regurgitation is not demonstrated.   Aortic Valve: The aortic valve is normal in structure. Aortic valve  regurgitation is not visualized.   Pulmonic Valve: Pulmonic valve regurgitation is trivial.   Aorta: The aortic root and ascending aorta are structurally normal, with  no evidence of dilitation.   Venous: The inferior vena cava is normal in size with greater than 50%  respiratory variability, suggesting right atrial pressure of 3 mmHg.   IAS/Shunts: No atrial level shunt detected by color flow Doppler.   EKG:  EKG is not ordered today.   Recent Labs: 08/10/2022: BUN 11; Creatinine, Ser 0.59; Potassium 5.0; Sodium 139; TSH 0.900 08/18/2022: Magnesium 1.9  Recent Lipid Panel No results found for: "CHOL", "TRIG", "HDL", "CHOLHDL", "VLDL", "LDLCALC", "LDLDIRECT"  Home Medications   Current Meds  Medication Sig   albuterol (PROVENTIL HFA;VENTOLIN HFA) 108 (90 Base) MCG/ACT inhaler Inhale 2 puffs into the lungs every 6 (six) hours as needed for wheezing or shortness of breath.   aspirin EC 81 MG tablet Take 81 mg by mouth daily.   atorvastatin (LIPITOR) 40 MG tablet Take 40 mg by mouth daily.   busPIRone (BUSPAR) 15 MG tablet Take 15 mg by mouth 2 (two) times daily.   clonazePAM (KLONOPIN) 0.5 MG tablet Take 0.5 mg by mouth 2 (two) times daily as needed for anxiety.   cyclobenzaprine (FLEXERIL) 10 MG tablet Take 10 mg by mouth 3 (three) times daily as needed for muscle spasms.   FLUoxetine (PROZAC) 20 MG capsule Take 60 mg by mouth daily.   HYDROcodone-acetaminophen (NORCO/VICODIN) 5-325 MG tablet Take 1 tablet by mouth every 6 (six) hours as needed for moderate pain.   Magnesium Oxide 400 MG CAPS Take 1 capsule (400 mg total) by mouth every Monday, Wednesday, and Friday. Take a half tablet (200 mg) every Saturday, Sunday, Tuesday, Thursday.   metoprolol succinate (TOPROL XL) 25 MG 24 hr tablet Take 1.5 tablets (37.5 mg total) by mouth daily.   telmisartan (MICARDIS) 40 MG tablet Take  40 mg by mouth daily.   zolpidem (AMBIEN) 10 MG tablet Take 10 mg by mouth at bedtime as needed for sleep.   [DISCONTINUED] metoprolol succinate (TOPROL XL) 25 MG 24 hr tablet Take 1 tablet (25 mg total) by mouth daily.     Review of Systems      All other systems reviewed and are otherwise negative except as noted above.  Physical Exam    VS:  BP (!) 144/88   Pulse 93   Ht 5\' 3"  (1.6 m)   Wt 144 lb 3.2 oz (65.4 kg)   SpO2 95%   BMI 25.54 kg/m  , BMI Body mass index is 25.54 kg/m.  Wt Readings from Last 3 Encounters:  09/21/22 144 lb 3.2 oz (65.4 kg)  07/17/22 140 lb (63.5 kg)  08/01/21 149 lb (67.6 kg)     GEN: Well nourished, well developed, in no acute distress. HEENT:  normal. Neck: Supple, no JVD, carotid bruits, or masses. Cardiac: RRR, no murmurs, rubs, or gallops. No clubbing, cyanosis, edema.  Radials/PT 2+ and equal bilaterally.  Respiratory:  Respirations regular and unlabored, clear to auscultation bilaterally. GI: Soft, nontender, nondistended. MS: No deformity or atrophy. Skin: Warm and dry, no rash. Neuro:  Strength and sensation are intact. Psych: Normal affect.  Assessment & Plan    Primary hypertension -Slightly elevated today at 144/88 -Increase metoprolol to 37 and half daily -Continue to keep track of heart rate and blood pressure at home   Pure hypercholesterolemia -she will recheck cholesterol levels with her PCP in August -Most recent labs showed LDL 44, HDL 73, total cholesterol 132, triglycerides 77 -Continue current medications which include Lipitor 40 mg daily  Palpitations -sometimes it feels like its racing, maybe once a week when she is watching TV -We have increased her metoprolol today -We reviewed her ZIO monitor and echocardiogram -we will check her mag today  4.  Clearance for upcoming dental procedure -We will need official clearance request form from her dental office -She does meet the minimum of 4 METS -She does not  have any new complaints today and workup has been negative -I think she is at the appropriate risk to undergo any dental work needed -We would prefer her to continue her aspirin throughout, however if bleeding risk is too high she can hold for 5 to 7 days prior to the procedure and resume when medically safe to do so  Disposition: Follow up 6 months with None or APP.  Signed, Elgie Collard, PA-C 09/21/2022, 9:20 AM [facility]

## 2022-09-21 ENCOUNTER — Encounter: Payer: Self-pay | Admitting: Physician Assistant

## 2022-09-21 ENCOUNTER — Telehealth: Payer: Self-pay | Admitting: Cardiology

## 2022-09-21 ENCOUNTER — Ambulatory Visit: Payer: 59 | Admitting: Physician Assistant

## 2022-09-21 ENCOUNTER — Ambulatory Visit: Payer: 59 | Attending: Physician Assistant | Admitting: Physician Assistant

## 2022-09-21 VITALS — BP 144/88 | HR 93 | Ht 63.0 in | Wt 144.2 lb

## 2022-09-21 DIAGNOSIS — R002 Palpitations: Secondary | ICD-10-CM

## 2022-09-21 DIAGNOSIS — I10 Essential (primary) hypertension: Secondary | ICD-10-CM

## 2022-09-21 DIAGNOSIS — E7800 Pure hypercholesterolemia, unspecified: Secondary | ICD-10-CM | POA: Diagnosis not present

## 2022-09-21 MED ORDER — METOPROLOL SUCCINATE ER 25 MG PO TB24: 37.5000 mg | ORAL_TABLET | Freq: Every day | ORAL | 3 refills | Status: DC

## 2022-09-21 NOTE — Patient Instructions (Addendum)
Medication Instructions:  Increase Metoprolol to 37.5 mg daily   *If you need a refill on your cardiac medications before your next appointment, please call your pharmacy*   Lab Work: Magnesium - today   If you have labs (blood work) drawn today and your tests are completely normal, you will receive your results only by: [place] (if you have MyChart) OR A paper copy in the mail If you have any lab test that is abnormal or we need to change your treatment, we will call you to review the results.   Testing/Procedures: None ordered    Follow-Up: At [facility], you and your health needs are our priority.  As part of our continuing mission to provide you with exceptional heart care, we have created designated Provider Care Teams.  These Care Teams include your primary Cardiologist (physician) and Advanced Practice Providers (APPs -  Physician Assistants and Nurse Practitioners) who all work together to provide you with the care you need, when you need it.  We recommend signing up for the patient portal called "MyChart".  Sign up information is provided on this After Visit Summary.  MyChart is used to connect with patients for Virtual Visits (Telemedicine).  Patients are able to view lab/test results, encounter notes, upcoming appointments, etc.  Non-urgent messages can be sent to your provider as well.   To learn more about what you can do with MyChart, go to [email or web address].    Your next appointment:   6 month(s)  Provider:   Dr. Fransico Him    Other Instructions Please have your dentist office fax [place] a preoperative clearance for your upcoming procedure   Fax: [phone]

## 2022-09-21 NOTE — Telephone Encounter (Signed)
   Patient Name: Molly [name]  DOB: June 26, 1955 MRN: [id number]  Primary Cardiologist: None  Chart reviewed as part of pre-operative protocol coverage. Given past medical history and time since last visit, based on ACC/AHA guidelines, Charise A Schuld is at acceptable risk for the planned procedure without further cardiovascular testing.  She was evaluated today by Shaaron Adler, PA and was able to complete 4 METS of activity without any difficulty.  The patient was advised that if she develops new symptoms prior to surgery to contact our office to arrange for a follow-up visit, and she verbalized understanding.  We would prefer her to continue her aspirin throughout, however if bleeding risk is too high she can hold for 5 to 7 days prior to the procedure and resume when medically safe to do so.   I will route this recommendation to the requesting party via Epic fax function and remove from pre-op pool.  Please call with questions.  Mable Fill, Marissa Nestle, NP 09/21/2022, 2:21 PM

## 2022-09-21 NOTE — Telephone Encounter (Signed)
   Pre-operative Risk Assessment    Patient Name: Molly [name]  DOB: 06/02/1956 MRN: [id number]     Request for Surgical Clearance    Procedure:  Dental Extraction - Amount of Teeth to be Pulled:  3 teeth - 20, 21 and 28  Date of Surgery:  Clearance TBD                                 Surgeon:  Dr.  August Albino Surgeon's Group or Practice Name:  [facility]  Phone number:  [phone]  Fax number:  [phone]   Type of Clearance Requested:   - Medical    Type of Anesthesia:  Not Indicated   Additional requests/questions:   Caller noted patient will be sedated (Code [phone]) using aneathesia but type of anesthesia was is not known.  Signed, Heloise Beecham   09/21/2022, 1:51 PM

## 2022-09-22 LAB — MAGNESIUM: Magnesium: 1.9 mg/dL (ref 1.6–2.3)

## 2022-09-29 DIAGNOSIS — I10 Essential (primary) hypertension: Secondary | ICD-10-CM | POA: Diagnosis not present

## 2022-09-29 DIAGNOSIS — E119 Type 2 diabetes mellitus without complications: Secondary | ICD-10-CM | POA: Diagnosis not present

## 2022-09-29 DIAGNOSIS — H35033 Hypertensive retinopathy, bilateral: Secondary | ICD-10-CM | POA: Diagnosis not present

## 2022-09-29 DIAGNOSIS — H353133 Nonexudative age-related macular degeneration, bilateral, advanced atrophic without subfoveal involvement: Secondary | ICD-10-CM | POA: Diagnosis not present

## 2022-09-29 DIAGNOSIS — H53413 Scotoma involving central area, bilateral: Secondary | ICD-10-CM | POA: Diagnosis not present

## 2022-10-07 DIAGNOSIS — H353133 Nonexudative age-related macular degeneration, bilateral, advanced atrophic without subfoveal involvement: Secondary | ICD-10-CM | POA: Diagnosis not present

## 2022-10-07 DIAGNOSIS — H43823 Vitreomacular adhesion, bilateral: Secondary | ICD-10-CM | POA: Diagnosis not present

## 2022-10-28 DIAGNOSIS — H353133 Nonexudative age-related macular degeneration, bilateral, advanced atrophic without subfoveal involvement: Secondary | ICD-10-CM | POA: Diagnosis not present

## 2022-12-10 DIAGNOSIS — M79675 Pain in left toe(s): Secondary | ICD-10-CM | POA: Diagnosis not present

## 2022-12-10 DIAGNOSIS — B351 Tinea unguium: Secondary | ICD-10-CM | POA: Diagnosis not present

## 2022-12-10 DIAGNOSIS — I7300 Raynaud's syndrome without gangrene: Secondary | ICD-10-CM | POA: Diagnosis not present

## 2022-12-21 ENCOUNTER — Telehealth: Payer: Self-pay | Admitting: Cardiology

## 2022-12-21 DIAGNOSIS — R002 Palpitations: Secondary | ICD-10-CM

## 2022-12-21 DIAGNOSIS — I10 Essential (primary) hypertension: Secondary | ICD-10-CM

## 2022-12-21 MED ORDER — METOPROLOL SUCCINATE ER 25 MG PO TB24: 37.5000 mg | ORAL_TABLET | Freq: Every day | ORAL | 3 refills | Status: DC

## 2022-12-21 NOTE — Telephone Encounter (Signed)
Pt c/o medication issue:  1. Name of Medication:   metoprolol succinate (TOPROL XL) 25 MG 24 hr tablet    2. How are you currently taking this medication (dosage and times per day)? Take 1.5 tablets (37.5 mg total) by mouth daily.   3. Are you having a reaction (difficulty breathing--STAT)? No  4. What is your medication issue? Pt states she has two tablets left and the pharmacy is not letting her get a refill because it is before the 90 days. Pt states that Dr. Mayford Knife told her to callback to adjust medication before next refill.

## 2022-12-21 NOTE — Telephone Encounter (Signed)
Spoke to the pt, she attempt to refill Metoprolol 37.5 mg however, pt was advised by the automatic system it is too early to refill the medication. Pt did not call to speak with pharmacy tech and she only has 2 tablets remaining. Verified and sent new prescription to the pharmacy. Will send to APP.

## 2022-12-29 DIAGNOSIS — H43823 Vitreomacular adhesion, bilateral: Secondary | ICD-10-CM | POA: Diagnosis not present

## 2022-12-29 DIAGNOSIS — H353133 Nonexudative age-related macular degeneration, bilateral, advanced atrophic without subfoveal involvement: Secondary | ICD-10-CM | POA: Diagnosis not present

## 2022-12-30 DIAGNOSIS — H5713 Ocular pain, bilateral: Secondary | ICD-10-CM | POA: Diagnosis not present

## 2023-01-26 DIAGNOSIS — M79644 Pain in right finger(s): Secondary | ICD-10-CM | POA: Diagnosis not present

## 2023-01-26 DIAGNOSIS — M79675 Pain in left toe(s): Secondary | ICD-10-CM | POA: Diagnosis not present

## 2023-02-05 DIAGNOSIS — M545 Low back pain, unspecified: Secondary | ICD-10-CM | POA: Diagnosis not present

## 2023-02-05 DIAGNOSIS — M1811 Unilateral primary osteoarthritis of first carpometacarpal joint, right hand: Secondary | ICD-10-CM | POA: Diagnosis not present

## 2023-02-19 DIAGNOSIS — E1169 Type 2 diabetes mellitus with other specified complication: Secondary | ICD-10-CM | POA: Diagnosis not present

## 2023-02-19 DIAGNOSIS — E7800 Pure hypercholesterolemia, unspecified: Secondary | ICD-10-CM | POA: Diagnosis not present

## 2023-02-23 DIAGNOSIS — H353133 Nonexudative age-related macular degeneration, bilateral, advanced atrophic without subfoveal involvement: Secondary | ICD-10-CM | POA: Diagnosis not present

## 2023-02-23 DIAGNOSIS — H43823 Vitreomacular adhesion, bilateral: Secondary | ICD-10-CM | POA: Diagnosis not present

## 2023-03-18 ENCOUNTER — Other Ambulatory Visit: Payer: Self-pay | Admitting: Physician Assistant

## 2023-03-18 DIAGNOSIS — R002 Palpitations: Secondary | ICD-10-CM

## 2023-03-18 DIAGNOSIS — I10 Essential (primary) hypertension: Secondary | ICD-10-CM

## 2023-03-21 NOTE — Progress Notes (Unsigned)
Office Visit    Patient Name: Molly [name] Date of Encounter: 03/22/2023  PCP:  Clovis Riley, L.August Saucer, MD   [facility]  Cardiologist:  None  Advanced Practice Provider:  No care team member to display Electrophysiologist:  None   HPI    Molly [name] is a 67 y.o. female with a past medical history of COPD, diabetes mellitus, heart murmur (at age 4 diagnosis), hypertension, anxiety presents today for follow-up.  She was seen remotely in the past by Dr. Tresa Endo for chest pain and nuclear stress test showed no ischemia in 2019.  Recently went to see oral surgeon and apparently her heart rate was over 100 bpm and they had to stop the procedure.  She has chronic DOE with COPD when she overexerts herself.  Denies any chest pain or pressure, PND, orthopnea, lower extremity edema, dizziness or syncope when she was last seen by Dr. Mayford Knife January 2024.  She was seen by me 09/21/2022, she tells me that she does not have any new symptoms since she saw Dr. Mayford Knife back in January.  She occasionally does have some palpitations about once a week that occur mostly when she is watching TV in the evenings.  She takes her magnesium at night since it makes her sleepy.  She has been compliant with her metoprolol and we have actually discussed increasing this today.  She does meet 4 METS without any issue.  She tells me she has some dental work that needs to be completed.  We discussed the need for an official clearance form.  She had a lipid panel done last summer which looked okay but she will be due in August for an updated panel through her primary.  Since she is on supplementation we will plan to repeat a magnesium level today.  Recent ZIO monitor and echocardiogram both looked good and we reviewed these today.  Today, she tells me that once a week she still struggles with palpitations.  She takes magnesium at night.  This has not been rechecked since we checked it in April.  On a  low-dose, 400 mg every other day and 200 mg on the opposite days.  With her blood pressure being elevated today and heart rate in the 80s, we have decided to increase her metoprolol succinate and we have encouraged her to take it at night since this is when she mostly experiences her palpitations.  No chest pains or shortness of breath.  We discussed smoking cessation and she has been weaning her use back and currently smoking a half a pack a day.  Reports no shortness of breath nor dyspnea on exertion. Reports no chest pain, pressure, or tightness. No edema, orthopnea, PND.   Past Medical History    Past Medical History:  Diagnosis Date   Anxiety    COPD (chronic obstructive pulmonary disease) (HCC)    Diabetes mellitus without complication (HCC)    Heart murmur    dx at age 81   Hypertension    Past Surgical History:  Procedure Laterality Date   NECK SURGERY      Allergies  Allergies  Allergen Reactions   Penicillins Hives     EKGs/Labs/Other Studies Reviewed:   The following studies were reviewed today:  Echo 09/04/22 IMPRESSIONS     1. No LVOT obstruction. No SAM. Left ventricular ejection fraction, by  estimation, is 60 to 65%. The left ventricle has normal function. The left  ventricle has no regional wall  motion abnormalities. Left ventricular  diastolic parameters are consistent  with Grade I diastolic dysfunction (impaired relaxation).   2. Right ventricular systolic function is normal. The right ventricular  size is normal.   3. No evidence of mitral valve regurgitation.   4. The aortic valve is normal in structure. Aortic valve regurgitation is  not visualized.   5. The inferior vena cava is normal in size with greater than 50%  respiratory variability, suggesting right atrial pressure of 3 mmHg.   FINDINGS   Left Ventricle: No LVOT obstruction. No SAM. Left ventricular ejection  fraction, by estimation, is 60 to 65%. The left ventricle has normal   function. The left ventricle has no regional wall motion abnormalities.  The left ventricular internal cavity size   was normal in size. There is no left ventricular hypertrophy. Left  ventricular diastolic parameters are consistent with Grade I diastolic  dysfunction (impaired relaxation).   Right Ventricle: The right ventricular size is normal. Right ventricular  systolic function is normal.   Left Atrium: Left atrial size was normal in size.   Right Atrium: Right atrial size was normal in size.   Pericardium: There is no evidence of pericardial effusion.   Mitral Valve: No evidence of mitral valve regurgitation.   Tricuspid Valve: Tricuspid valve regurgitation is not demonstrated.   Aortic Valve: The aortic valve is normal in structure. Aortic valve  regurgitation is not visualized.   Pulmonic Valve: Pulmonic valve regurgitation is trivial.   Aorta: The aortic root and ascending aorta are structurally normal, with  no evidence of dilitation.   Venous: The inferior vena cava is normal in size with greater than 50%  respiratory variability, suggesting right atrial pressure of 3 mmHg.   IAS/Shunts: No atrial level shunt detected by color flow Doppler.   EKG:  EKG is not ordered today.   Recent Labs: 08/10/2022: BUN 11; Creatinine, Ser 0.59; Potassium 5.0; Sodium 139; TSH 0.900 09/21/2022: Magnesium 1.9  Recent Lipid Panel No results found for: "CHOL", "TRIG", "HDL", "CHOLHDL", "VLDL", "LDLCALC", "LDLDIRECT"  Home Medications   Current Meds  Medication Sig   albuterol (PROVENTIL HFA;VENTOLIN HFA) 108 (90 Base) MCG/ACT inhaler Inhale 2 puffs into the lungs every 6 (six) hours as needed for wheezing or shortness of breath.   aspirin EC 81 MG tablet Take 81 mg by mouth daily.   atorvastatin (LIPITOR) 40 MG tablet Take 40 mg by mouth daily.   busPIRone (BUSPAR) 15 MG tablet Take 15 mg by mouth 2 (two) times daily.   clonazePAM (KLONOPIN) 0.5 MG tablet Take 0.5 mg by mouth 2  (two) times daily as needed for anxiety.   cyclobenzaprine (FLEXERIL) 10 MG tablet Take 10 mg by mouth 3 (three) times daily as needed for muscle spasms.   DULoxetine (CYMBALTA) 30 MG capsule Take 30 mg by mouth daily.   FLUoxetine (PROZAC) 20 MG capsule Take 60 mg by mouth daily.   Magnesium Oxide 400 MG CAPS Take 1 capsule (400 mg total) by mouth every Monday, Wednesday, and Friday. Take a half tablet (200 mg) every Saturday, Sunday, Tuesday, Thursday.   meloxicam (MOBIC) 15 MG tablet Take 15 mg by mouth daily as needed.   metFORMIN (GLUCOPHAGE) 500 MG tablet Take 500 mg by mouth 2 (two) times daily.   metoprolol succinate (TOPROL-XL) 50 MG 24 hr tablet Take 1 tablet by mouth every night. Take with or immediately following a meal.   pantoprazole (PROTONIX) 40 MG tablet Take 40 mg by mouth daily  as needed.   telmisartan (MICARDIS) 40 MG tablet Take 40 mg by mouth daily.   tobramycin (TOBREX) 0.3 % ophthalmic solution Place into both eyes every 4 (four) hours.   zolpidem (AMBIEN) 10 MG tablet Take 10 mg by mouth at bedtime as needed for sleep.   [DISCONTINUED] metoprolol succinate (TOPROL-XL) 25 MG 24 hr tablet TAKE 1.5 TABLETS BY MOUTH DAILY.     Review of Systems      All other systems reviewed and are otherwise negative except as noted above.  Physical Exam    VS:  BP (!) 148/88   Pulse 83   Ht 5\' 3"  (1.6 m)   Wt 150 lb 3.2 oz (68.1 kg)   SpO2 93%   BMI 26.61 kg/m  , BMI Body mass index is 26.61 kg/m.  Wt Readings from Last 3 Encounters:  03/22/23 150 lb 3.2 oz (68.1 kg)  09/21/22 144 lb 3.2 oz (65.4 kg)  07/17/22 140 lb (63.5 kg)     GEN: Well nourished, well developed, in no acute distress. HEENT: normal. Neck: Supple, no JVD, carotid bruits, or masses. Cardiac: RRR, no murmurs, rubs, or gallops. No clubbing, cyanosis, edema.  Radials/PT 2+ and equal bilaterally.  Respiratory:  Respirations regular and unlabored, clear to auscultation bilaterally. GI: Soft, nontender,  nondistended. MS: No deformity or atrophy. Skin: Warm and dry, no rash. Neuro:  Strength and sensation are intact. Psych: Normal affect.  Assessment & Plan    Primary hypertension -Slightly elevated today at 148/88 -Increase metoprolol to 50mg  at night -Continue to keep track of heart rate and blood pressure at home   Pure hypercholesterolemia -Recent cholesterol panel done a month ago by PCP showed LDL 67, HDL 67, total cholesterol 161, triglycerides 97, all values at goal -Continue current medications which include Lipitor 40 mg daily  Palpitations -sometimes it feels like its racing, maybe once a week when she is watching TV -We have increased her metoprolol today -Continue magnesium supplementation at night, potassium was normal when checked a month ago -No new labs needed today  4. Tobacco abuse -1/2 pack a day -she continues to try and wean her use  Disposition: Follow up 6 months with None or APP.  Signed, Sharlene Dory, PA-C 03/22/2023, 10:20 AM [facility]

## 2023-03-22 ENCOUNTER — Ambulatory Visit: Payer: 59 | Attending: Physician Assistant | Admitting: Physician Assistant

## 2023-03-22 ENCOUNTER — Encounter: Payer: Self-pay | Admitting: Physician Assistant

## 2023-03-22 VITALS — BP 148/88 | HR 83 | Ht 63.0 in | Wt 150.2 lb

## 2023-03-22 DIAGNOSIS — R002 Palpitations: Secondary | ICD-10-CM

## 2023-03-22 DIAGNOSIS — I10 Essential (primary) hypertension: Secondary | ICD-10-CM | POA: Diagnosis not present

## 2023-03-22 DIAGNOSIS — E785 Hyperlipidemia, unspecified: Secondary | ICD-10-CM | POA: Diagnosis not present

## 2023-03-22 MED ORDER — METOPROLOL SUCCINATE ER 50 MG PO TB24: ORAL_TABLET | ORAL | 3 refills | Status: DC

## 2023-03-22 NOTE — Patient Instructions (Signed)
Medication Instructions:  Increase metoprolol succinate to 50 mg at night. *If you need a refill on your cardiac medications before your next appointment, please call your pharmacy*  Lab Work: None ordered If you have labs (blood work) drawn today and your tests are completely normal, you will receive your results only by: MyChart Message (if you have MyChart) OR A paper copy in the mail If you have any lab test that is abnormal or we need to change your treatment, we will call you to review the results.  Follow-Up: At [facility], you and your health needs are our priority.  As part of our continuing mission to provide you with exceptional heart care, we have created designated Provider Care Teams.  These Care Teams include your primary Cardiologist (physician) and Advanced Practice Providers (APPs -  Physician Assistants and Nurse Practitioners) who all work together to provide you with the care you need, when you need it.  Your next appointment:   6 month(s)  Provider:   Dr Mayford Knife  Low-Sodium Eating Plan Salt (sodium) helps you keep a healthy balance of fluids in your body. Too much sodium can raise your blood pressure. It can also cause fluid and waste to be held in your body. Your health care provider or dietitian may recommend a low-sodium eating plan if you have high blood pressure (hypertension), kidney disease, liver disease, or heart failure. Eating less sodium can help lower your blood pressure and reduce swelling. It can also protect your heart, liver, and kidneys. What are tips for following this plan? Reading food labels  Check food labels for the amount of sodium per serving. If you eat more than one serving, you must multiply the listed amount by the number of servings. Choose foods with less than 140 milligrams (mg) of sodium per serving. Avoid foods with 300 mg of sodium or more per serving. Always check how much sodium is in a product, even if the label says  "unsalted" or "no salt added." Shopping  Buy products labeled as "low-sodium" or "no salt added." Buy fresh foods. Avoid canned foods and pre-made or frozen meals. Avoid canned, cured, or processed meats. Buy breads that have less than 80 mg of sodium per slice. Cooking  Eat more home-cooked food. Try to eat less restaurant, buffet, and fast food. Try not to add salt when you cook. Use salt-free seasonings or herbs instead of table salt or sea salt. Check with your provider or pharmacist before using salt substitutes. Cook with plant-based oils, such as canola, sunflower, or olive oil. Meal planning When eating at a restaurant, ask if your food can be made with less salt or no salt. Avoid dishes labeled as brined, pickled, cured, or smoked. Avoid dishes made with soy sauce, miso, or teriyaki sauce. Avoid foods that have monosodium glutamate (MSG) in them. MSG may be added to some restaurant food, sauces, soups, bouillon, and canned foods. Make meals that can be grilled, baked, poached, roasted, or steamed. These are often made with less sodium. General information Try to limit your sodium intake to 1,500-2,300 mg each day, or the amount told by your provider. What foods should I eat? Fruits Fresh, frozen, or canned fruit. Fruit juice. Vegetables Fresh or frozen vegetables. "No salt added" canned vegetables. "No salt added" tomato sauce and paste. Low-sodium or reduced-sodium tomato and vegetable juice. Grains Low-sodium cereals, such as oats, puffed wheat and rice, and shredded wheat. Low-sodium crackers. Unsalted rice. Unsalted pasta. Low-sodium bread. Whole grain  breads and whole grain pasta. Meats and other proteins Fresh or frozen meat, poultry, seafood, and fish. These should have no added salt. Low-sodium canned tuna and salmon. Unsalted nuts. Dried peas, beans, and lentils without added salt. Unsalted canned beans. Eggs. Unsalted nut butters. Dairy Milk. Soy milk. Cheese that is  naturally low in sodium, such as ricotta cheese, fresh mozzarella, or Swiss cheese. Low-sodium or reduced-sodium cheese. Cream cheese. Yogurt. Seasonings and condiments Fresh and dried herbs and spices. Salt-free seasonings. Low-sodium mustard and ketchup. Sodium-free salad dressing. Sodium-free light mayonnaise. Fresh or refrigerated horseradish. Lemon juice. Vinegar. Other foods Homemade, reduced-sodium, or low-sodium soups. Unsalted popcorn and pretzels. Low-salt or salt-free chips. The items listed above may not be all the foods and drinks you can have. Talk to a dietitian to learn more. What foods should I avoid? Vegetables Sauerkraut, pickled vegetables, and relishes. Olives. [place] fries. Onion rings. Regular canned vegetables, except low-sodium or reduced-sodium items. Regular canned tomato sauce and paste. Regular tomato and vegetable juice. Frozen vegetables in sauces. Grains Instant hot cereals. Bread stuffing, pancake, and biscuit mixes. Croutons. Seasoned rice or pasta mixes. Noodle soup cups. Boxed or frozen macaroni and cheese. Regular salted crackers. Self-rising flour. Meats and other proteins Meat or fish that is salted, canned, smoked, spiced, or pickled. Precooked or cured meat, such as sausages or meat loaves. Tomasa Blase. Ham. Pepperoni. Hot dogs. Corned beef. Chipped beef. Salt pork. Jerky. Pickled herring, anchovies, and sardines. Regular canned tuna. Salted nuts. Dairy Processed cheese and cheese spreads. Hard cheeses. Cheese curds. Blue cheese. Feta cheese. String cheese. Regular cottage cheese. Buttermilk. Canned milk. Fats and oils Salted butter. Regular margarine. Ghee. Bacon fat. Seasonings and condiments Onion salt, garlic salt, seasoned salt, table salt, and sea salt. Canned and packaged gravies. Worcestershire sauce. Tartar sauce. Barbecue sauce. Teriyaki sauce. Soy sauce, including reduced-sodium soy sauce. Steak sauce. Fish sauce. Oyster sauce. Cocktail sauce.  Horseradish that you find on the shelf. Regular ketchup and mustard. Meat flavorings and tenderizers. Bouillon cubes. Hot sauce. Pre-made or packaged marinades. Pre-made or packaged taco seasonings. Relishes. Regular salad dressings. Salsa. Other foods Salted popcorn and pretzels. Corn chips and puffs. Potato and tortilla chips. Canned or dried soups. Pizza. Frozen entrees and pot pies. The items listed above may not be all the foods and drinks you should avoid. Talk to a dietitian to learn more. This information is not intended to replace advice given to you by your health care provider. Make sure you discuss any questions you have with your health care provider. Document Revised: 06/25/2022 Document Reviewed: 06/25/2022 Elsevier Patient Education  2024 [employer] Heart-Healthy Eating Plan Many factors influence your heart health, including eating and exercise habits. Heart health is also called coronary health. Coronary risk increases with abnormal blood fat (lipid) levels. A heart-healthy eating plan includes limiting unhealthy fats, increasing healthy fats, limiting salt (sodium) intake, and making other diet and lifestyle changes. What is my plan? Your health care provider may recommend that: You limit your fat intake to _________% or less of your total calories each day. You limit your saturated fat intake to _________% or less of your total calories each day. You limit the amount of cholesterol in your diet to less than _________ mg per day. You limit the amount of sodium in your diet to less than _________ mg per day. What are tips for following this plan? Cooking Cook foods using methods other than frying. Baking, boiling, grilling, and broiling are all good options. Other ways  to reduce fat include: Removing the skin from poultry. Removing all visible fats from meats. Steaming vegetables in water or broth. Meal planning  At meals, imagine dividing your plate into fourths: Fill  one-half of your plate with vegetables and green salads. Fill one-fourth of your plate with whole grains. Fill one-fourth of your plate with lean protein foods. Eat 2-4 cups of vegetables per day. One cup of vegetables equals 1 cup (91 g) broccoli or cauliflower florets, 2 medium carrots, 1 large bell pepper, 1 large sweet potato, 1 large tomato, 1 medium white potato, 2 cups (150 g) raw leafy greens. Eat 1-2 cups of fruit per day. One cup of fruit equals 1 small apple, 1 large banana, 1 cup (237 g) mixed fruit, 1 large orange,  cup (82 g) dried fruit, 1 cup (240 mL) 100% fruit juice. Eat more foods that contain soluble fiber. Examples include apples, broccoli, carrots, beans, peas, and barley. Aim to get 25-30 g of fiber per day. Increase your consumption of legumes, nuts, and seeds to 4-5 servings per week. One serving of dried beans or legumes equals  cup (90 g) cooked, 1 serving of nuts is  oz (12 almonds, 24 pistachios, or 7 walnut halves), and 1 serving of seeds equals  oz (8 g). Fats Choose healthy fats more often. Choose monounsaturated and polyunsaturated fats, such as olive and canola oils, avocado oil, flaxseeds, walnuts, almonds, and seeds. Eat more omega-3 fats. Choose salmon, mackerel, sardines, tuna, flaxseed oil, and ground flaxseeds. Aim to eat fish at least 2 times each week. Check food labels carefully to identify foods with trans fats or high amounts of saturated fat. Limit saturated fats. These are found in animal products, such as meats, butter, and cream. Plant sources of saturated fats include palm oil, palm kernel oil, and coconut oil. Avoid foods with partially hydrogenated oils in them. These contain trans fats. Examples are stick margarine, some tub margarines, cookies, crackers, and other baked goods. Avoid fried foods. General information Eat more home-cooked food and less restaurant, buffet, and fast food. Limit or avoid alcohol. Limit foods that are high in  added sugar and simple starches such as foods made using white refined flour (white breads, pastries, sweets). Lose weight if you are overweight. Losing just 5-10% of your body weight can help your overall health and prevent diseases such as diabetes and heart disease. Monitor your sodium intake, especially if you have high blood pressure. Talk with your health care provider about your sodium intake. Try to incorporate more vegetarian meals weekly. What foods should I eat? Fruits All fresh, canned (in natural juice), or frozen fruits. Vegetables Fresh or frozen vegetables (raw, steamed, roasted, or grilled). Green salads. Grains Most grains. Choose whole wheat and whole grains most of the time. Rice and pasta, including brown rice and pastas made with whole wheat. Meats and other proteins Lean, well-trimmed beef, veal, pork, and lamb. Chicken and [place] without skin. All fish and shellfish. Wild duck, rabbit, pheasant, and venison. Egg whites or low-cholesterol egg substitutes. Dried beans, peas, lentils, and tofu. Seeds and most nuts. Dairy Low-fat or nonfat cheeses, including ricotta and mozzarella. Skim or 1% milk (liquid, powdered, or evaporated). Buttermilk made with low-fat milk. Nonfat or low-fat yogurt. Fats and oils Non-hydrogenated (trans-free) margarines. Vegetable oils, including soybean, sesame, sunflower, olive, avocado, peanut, safflower, corn, canola, and cottonseed. Salad dressings or mayonnaise made with a vegetable oil. Beverages Water (mineral or sparkling). Coffee and tea. Unsweetened ice tea. Diet beverages.  Sweets and desserts Sherbet, gelatin, and fruit ice. Small amounts of dark chocolate. Limit all sweets and desserts. Seasonings and condiments All seasonings and condiments. The items listed above may not be a complete list of foods and beverages you can eat. Contact a dietitian for more options. What foods should I avoid? Fruits Canned fruit in heavy syrup. Fruit  in cream or butter sauce. Fried fruit. Limit coconut. Vegetables Vegetables cooked in cheese, cream, or butter sauce. Fried vegetables. Grains Breads made with saturated or trans fats, oils, or whole milk. Croissants. Sweet rolls. Donuts. High-fat crackers, such as cheese crackers and chips. Meats and other proteins Fatty meats, such as hot dogs, ribs, sausage, bacon, rib-eye roast or steak. High-fat deli meats, such as salami and bologna. Caviar. Domestic duck and goose. Organ meats, such as liver. Dairy Cream, sour cream, cream cheese, and creamed cottage cheese. Whole-milk cheeses. Whole or 2% milk (liquid, evaporated, or condensed). Whole buttermilk. Cream sauce or high-fat cheese sauce. Whole-milk yogurt. Fats and oils Meat fat, or shortening. Cocoa butter, hydrogenated oils, palm oil, coconut oil, palm kernel oil. Solid fats and shortenings, including bacon fat, salt pork, lard, and butter. Nondairy cream substitutes. Salad dressings with cheese or sour cream. Beverages Regular sodas and any drinks with added sugar. Sweets and desserts Frosting. Pudding. Cookies. Cakes. Pies. Milk chocolate or white chocolate. Buttered syrups. Full-fat ice cream or ice cream drinks. The items listed above may not be a complete list of foods and beverages to avoid. Contact a dietitian for more information. Summary Heart-healthy meal planning includes limiting unhealthy fats, increasing healthy fats, limiting salt (sodium) intake and making other diet and lifestyle changes. Lose weight if you are overweight. Losing just 5-10% of your body weight can help your overall health and prevent diseases such as diabetes and heart disease. Focus on eating a balance of foods, including fruits and vegetables, low-fat or nonfat dairy, lean protein, nuts and legumes, whole grains, and heart-healthy oils and fats. This information is not intended to replace advice given to you by your health care provider. Make sure you  discuss any questions you have with your health care provider. Document Revised: 07/14/2021 Document Reviewed: 07/14/2021 Elsevier Patient Education  2024 [employer].

## 2023-04-05 DIAGNOSIS — Z1231 Encounter for screening mammogram for malignant neoplasm of breast: Secondary | ICD-10-CM | POA: Diagnosis not present

## 2023-04-27 DIAGNOSIS — H353133 Nonexudative age-related macular degeneration, bilateral, advanced atrophic without subfoveal involvement: Secondary | ICD-10-CM | POA: Diagnosis not present

## 2023-04-27 DIAGNOSIS — H43823 Vitreomacular adhesion, bilateral: Secondary | ICD-10-CM | POA: Diagnosis not present

## 2023-04-28 ENCOUNTER — Other Ambulatory Visit: Payer: Self-pay | Admitting: Cardiology

## 2023-04-28 MED ORDER — MAGNESIUM OXIDE 400 MG PO CAPS: 400.0000 mg | ORAL_CAPSULE | ORAL | 2 refills | Status: DC

## 2023-06-22 DIAGNOSIS — H43823 Vitreomacular adhesion, bilateral: Secondary | ICD-10-CM | POA: Diagnosis not present

## 2023-06-22 DIAGNOSIS — H353133 Nonexudative age-related macular degeneration, bilateral, advanced atrophic without subfoveal involvement: Secondary | ICD-10-CM | POA: Diagnosis not present

## 2023-06-30 ENCOUNTER — Other Ambulatory Visit: Payer: Self-pay | Admitting: Cardiology

## 2023-06-30 MED ORDER — MAGNESIUM OXIDE 400 MG PO CAPS
400.0000 mg | ORAL_CAPSULE | ORAL | 2 refills | Status: DC
Start: 2023-06-30 — End: 2023-10-29

## 2023-09-09 ENCOUNTER — Other Ambulatory Visit: Payer: Self-pay | Admitting: Cardiology

## 2023-09-15 NOTE — Progress Notes (Deleted)
 Cardiology  Note    Date:  09/15/2023   ID:  Molly [name], DOB 08-Aug-1955, MRN [id number]  PCP:  Clovis Riley, Elbert Ewings.August Saucer, MD (Inactive)  Cardiologist:  Armanda Magic, MD   No chief complaint on file.   History of Present Illness:  Molly [name] is a 68 y.o. female with a hx of COPD, HTN, diabetes mellitus, hyperlipidemia tobacco use and anxiety.  She was seen remotely in the past by Dr. Tresa Endo for chest pain and nuclear stress test showed no ischemia in 10-15-2017. She was most recently seen by me after she went to the oral surgeon and apparently her heart rate went up over 100bpm and had to stop the procedure before proceeding with her oral surgery.    She does have a history of COPD with chronic dyspnea on exertion with overexertion.  2-week Zio patch 07/31/2022 showed normal sinus rhythm with average heart rate of 100 bpm with rare PACs.  He was started on Toprol-XL 25 mg daily.  TSH was normal as well as potassium level.  2D echo 09/04/2022 showed EF 60 to 65% with G1 DD and normal valvular function.  She is here today for followup and is doing well.  SHe denies any chest pain or pressure, SOB, DOE, PND, orthopnea, LE edema, dizziness, palpitations or syncope. She is compliant with her meds and is tolerating meds with no SE.     Past Medical History:  Diagnosis Date   Anxiety    COPD (chronic obstructive pulmonary disease) (HCC)    Diabetes mellitus without complication (HCC)    Heart murmur    dx at age 50   Hypertension     Past Surgical History:  Procedure Laterality Date   NECK SURGERY      Current Medications: No outpatient medications have been marked as taking for the 09/16/23 encounter (Appointment) with Quintella Reichert, MD.    Allergies:   Penicillins   Social History   Socioeconomic History   Marital status: Divorced    Spouse name: Not on file   Number of children: 1   Years of education: Not on file   Highest education level: Not on file  Occupational History    Occupation: Disabled due to back problems.  Tobacco Use   Smoking status: Every Day    Current packs/day: 0.50    Average packs/day: 0.5 packs/day for 50.0 years (25.0 ttl pk-yrs)    Types: Cigarettes   Smokeless tobacco: Never  Substance and Sexual Activity   Alcohol use: Not Currently   Drug use: No   Sexual activity: Not on file  Other Topics Concern   Not on file  Social History Narrative   Son has passed away 2015/10/16      Right Handed   Lives in a one story mobile home    Social Drivers of Health   Financial Resource Strain: Not on file  Food Insecurity: Not on file  Transportation Needs: Not on file  Physical Activity: Not on file  Stress: Not on file  Social Connections: Unknown (11/04/2021)   Received from [facility]   Social Network    Social Network: Not on file     Family History:  The patient's family history includes Diabetes in her mother; Heart failure in her father and mother; Hypertension in her mother.   ROS:   Please see the history of present illness.    ROS All other systems reviewed and are negative.  No data to display             PHYSICAL EXAM:   VS:  There were no vitals taken for this visit.   GEN: Well nourished, well developed in no acute distress HEENT: Normal NECK: No JVD; No carotid bruits LYMPHATICS: No lymphadenopathy CARDIAC:RRR, no murmurs, rubs, gallops RESPIRATORY:  Clear to auscultation without rales, wheezing or rhonchi  ABDOMEN: Soft, non-tender, non-distended MUSCULOSKELETAL:  No edema; No deformity  SKIN: Warm and dry NEUROLOGIC:  Alert and oriented x 3 PSYCHIATRIC:  Normal affect  Wt Readings from Last 3 Encounters:  03/22/23 150 lb 3.2 oz (68.1 kg)  09/21/22 144 lb 3.2 oz (65.4 kg)  07/17/22 140 lb (63.5 kg)      Studies/Labs Reviewed:    Recent Labs: 09/21/2022: Magnesium 1.9   Lipid Panel No results found for: "CHOL", "TRIG", "HDL", "CHOLHDL", "VLDL", "LDLCALC", "LDLDIRECT"       ASSESSMENT:    1. Essential hypertension   2. Hyperlipidemia LDL goal <70   3. Palpitations       PLAN:  In order of problems listed above:  HTN -BP controlled on exam today -Continue prescription drug management with Toprol-XL 50 mg daily, telmisartan 40 mg daily with as needed refills  -I have personally reviewed and interpreted outside labs performed by patient's PCP which showed ***  2.  HLD -LDL goal < 100 -Continue prescription drug management with atorvastatin 40 mg daily with as needed refills -I have personally reviewed and interpreted outside labs performed by patient's PCP which showed ***  3.  Palpitations -Elevated heart rate initially noted at oral surgeons office with heart rate around to 100 -2-week Zio patch showed mild sinus tachycardia 101 bpm for average heart rate -Heart rate well-controlled now on Toprol XL 25 mg daily which she will continue  Time Spent: 20 minutes total time of encounter, including 15 minutes spent in face-to-face patient care on the date of this encounter. This time includes coordination of care and counseling regarding above mentioned problem list. Remainder of non-face-to-face time involved reviewing chart documents/testing relevant to the patient encounter and documentation in the medical record. I have independently reviewed documentation from referring provider  Medication Adjustments/Labs and Tests Ordered: Current medicines are reviewed at length with the patient today.  Concerns regarding medicines are outlined above.  Medication changes, Labs and Tests ordered today are listed in the Patient Instructions below.  There are no Patient Instructions on file for this visit.   Signed, Armanda Magic, MD  09/15/2023 8:30 PM    [facility] [address] [place], [place], [place]  [address] Phone: [phone]; Fax: [phone]

## 2023-09-16 ENCOUNTER — Ambulatory Visit: Payer: 59 | Admitting: Cardiology

## 2023-09-16 DIAGNOSIS — I10 Essential (primary) hypertension: Secondary | ICD-10-CM

## 2023-09-16 DIAGNOSIS — E785 Hyperlipidemia, unspecified: Secondary | ICD-10-CM

## 2023-09-16 DIAGNOSIS — R002 Palpitations: Secondary | ICD-10-CM

## 2023-10-05 ENCOUNTER — Other Ambulatory Visit: Payer: Self-pay

## 2023-10-05 ENCOUNTER — Encounter (HOSPITAL_COMMUNITY): Payer: Self-pay

## 2023-10-05 ENCOUNTER — Emergency Department (HOSPITAL_COMMUNITY)
Admission: EM | Admit: 2023-10-05 | Discharge: 2023-10-06 | Disposition: A | Discharge status: Other Acute Inpatient Hospital | Attending: Emergency Medicine | Admitting: Emergency Medicine

## 2023-10-05 ENCOUNTER — Emergency Department (HOSPITAL_COMMUNITY)

## 2023-10-05 DIAGNOSIS — Z7982 Long term (current) use of aspirin: Secondary | ICD-10-CM | POA: Diagnosis not present

## 2023-10-05 DIAGNOSIS — F32A Depression, unspecified: Secondary | ICD-10-CM | POA: Insufficient documentation

## 2023-10-05 DIAGNOSIS — F1010 Alcohol abuse, uncomplicated: Secondary | ICD-10-CM | POA: Insufficient documentation

## 2023-10-05 DIAGNOSIS — Z79899 Other long term (current) drug therapy: Secondary | ICD-10-CM | POA: Diagnosis not present

## 2023-10-05 DIAGNOSIS — S0990XA Unspecified injury of head, initial encounter: Secondary | ICD-10-CM | POA: Diagnosis present

## 2023-10-05 DIAGNOSIS — Y900 Blood alcohol level of less than 20 mg/100 ml: Secondary | ICD-10-CM | POA: Diagnosis not present

## 2023-10-05 LAB — COMPREHENSIVE METABOLIC PANEL WITH GFR
ALT: 38 U/L (ref 0–44)
AST: 29 U/L (ref 15–41)
Albumin: 3.3 g/dL — ABNORMAL LOW (ref 3.5–5.0)
Alkaline Phosphatase: 106 U/L (ref 38–126)
Anion gap: 11 (ref 5–15)
BUN: 13 mg/dL (ref 8–23)
CO2: 22 mmol/L (ref 22–32)
Calcium: 9.1 mg/dL (ref 8.9–10.3)
Chloride: 106 mmol/L (ref 98–111)
Creatinine, Ser: 0.53 mg/dL (ref 0.44–1.00)
GFR, Estimated: >60 mL/min (ref >60)
Glucose, Bld: 125 mg/dL — ABNORMAL HIGH (ref 70–99)
Potassium: 5 mmol/L (ref 3.5–5.1)
Sodium: 139 mmol/L (ref 135–145)
Total Bilirubin: 0.4 mg/dL (ref 0.0–1.2)
Total Protein: 6.1 g/dL — ABNORMAL LOW (ref 6.5–8.1)

## 2023-10-05 LAB — CBC WITH DIFFERENTIAL/PLATELET
Abs Immature Granulocytes: 0.02 10*3/uL (ref 0.00–0.07)
Basophils Absolute: 0.1 10*3/uL (ref 0.0–0.1)
Basophils Relative: 1 %
Eosinophils Absolute: 0.4 10*3/uL (ref 0.0–0.5)
Eosinophils Relative: 5 %
HCT: 43 % (ref 36.0–46.0)
Hemoglobin: 14.2 g/dL (ref 12.0–15.0)
Immature Granulocytes: 0 %
Lymphocytes Relative: 25 %
Lymphs Abs: 2 10*3/uL (ref 0.7–4.0)
MCH: 32.3 pg (ref 26.0–34.0)
MCHC: 33 g/dL (ref 30.0–36.0)
MCV: 97.7 fL (ref 80.0–100.0)
Monocytes Absolute: 0.4 10*3/uL (ref 0.1–1.0)
Monocytes Relative: 5 %
Neutro Abs: 5.3 10*3/uL (ref 1.7–7.7)
Neutrophils Relative %: 64 %
Platelets: 153 10*3/uL (ref 150–400)
RBC: 4.4 MIL/uL (ref 3.87–5.11)
RDW: 14.6 % (ref 11.5–15.5)
WBC: 8.2 10*3/uL (ref 4.0–10.5)
nRBC: 0 % (ref 0.0–0.2)

## 2023-10-05 LAB — AMMONIA: Ammonia: 30 umol/L (ref 9–35)

## 2023-10-05 LAB — RAPID URINE DRUG SCREEN, HOSP PERFORMED
Amphetamines: NOT DETECTED
Barbiturates: NOT DETECTED
Benzodiazepines: NOT DETECTED
Cocaine: NOT DETECTED
Opiates: NOT DETECTED
Tetrahydrocannabinol: NOT DETECTED

## 2023-10-05 LAB — ETHANOL: Alcohol, Ethyl (B): 21 mg/dL — ABNORMAL HIGH (ref <10)

## 2023-10-05 LAB — LIPASE, BLOOD: Lipase: 24 U/L (ref 11–51)

## 2023-10-05 MED ORDER — LORAZEPAM 1 MG PO TABS
1.0000 mg | ORAL_TABLET | Freq: Once | ORAL | Status: AC
  Administered 2023-10-05: 1 mg via ORAL
  Filled 2023-10-05: qty 1

## 2023-10-05 MED ORDER — LORAZEPAM 2 MG/ML IJ SOLN: 0.0000 mg | Freq: Four times a day (QID) | INTRAMUSCULAR | Status: DC

## 2023-10-05 MED ORDER — LORAZEPAM 2 MG/ML IJ SOLN: 0.0000 mg | Freq: Two times a day (BID) | INTRAMUSCULAR | Status: DC

## 2023-10-05 MED ORDER — ALBUTEROL SULFATE HFA 108 (90 BASE) MCG/ACT IN AERS: 2.0000 | INHALATION_SPRAY | Freq: Four times a day (QID) | RESPIRATORY_TRACT | Status: DC | PRN

## 2023-10-05 MED ORDER — ATORVASTATIN CALCIUM 40 MG PO TABS: 40.0000 mg | ORAL_TABLET | Freq: Every day | ORAL | Status: DC

## 2023-10-05 MED ORDER — ACETAMINOPHEN 325 MG PO TABS: 650.0000 mg | ORAL_TABLET | ORAL | Status: DC | PRN

## 2023-10-05 MED ORDER — ASPIRIN 81 MG PO TBEC: 81.0000 mg | DELAYED_RELEASE_TABLET | Freq: Every day | ORAL | Status: DC

## 2023-10-05 MED ORDER — THIAMINE MONONITRATE 100 MG PO TABS: 100.0000 mg | ORAL_TABLET | Freq: Every day | ORAL | Status: DC

## 2023-10-05 MED ORDER — FLUOXETINE HCL 20 MG PO CAPS: 40.0000 mg | ORAL_CAPSULE | Freq: Every day | ORAL | Status: DC

## 2023-10-05 MED ORDER — LORAZEPAM 1 MG PO TABS: 0.0000 mg | ORAL_TABLET | Freq: Two times a day (BID) | ORAL | Status: DC

## 2023-10-05 MED ORDER — MAGNESIUM OXIDE -MG SUPPLEMENT 400 (240 MG) MG PO TABS: 400.0000 mg | ORAL_TABLET | ORAL | Status: DC

## 2023-10-05 MED ORDER — THIAMINE HCL 100 MG/ML IJ SOLN: 100.0000 mg | Freq: Every day | INTRAMUSCULAR | Status: DC

## 2023-10-05 MED ORDER — METOPROLOL SUCCINATE ER 25 MG PO TB24: 50.0000 mg | ORAL_TABLET | Freq: Every day | ORAL | Status: DC

## 2023-10-05 MED ORDER — LORAZEPAM 1 MG PO TABS: 0.0000 mg | ORAL_TABLET | Freq: Four times a day (QID) | ORAL | Status: DC

## 2023-10-05 MED ORDER — IRBESARTAN 75 MG PO TABS: 75.0000 mg | ORAL_TABLET | Freq: Every day | ORAL | Status: DC

## 2023-10-05 NOTE — ED Provider Triage Note (Signed)
 Emergency Medicine Provider Triage Evaluation Note  Molly [name] , a 68 y.o. female  was evaluated in triage.  Pt complains of alcohol dependency.  Last drink this morning.  Patient was advised by her doctor to come to the emergency department for significantly elevated liver function test.  Patient reports that she has had problems at home with being off balance and falling frequently.  No vomiting.  Review of Systems  Positive: Frequent falls Negative: Lower extremity swelling  Physical Exam  BP (!) 162/87 (BP Location: Left Arm)   Pulse 94   Temp 98.8 F (37.1 C)   Resp 20   Ht 5\' 3"  (1.6 m)   Wt 63.5 kg   SpO2 91%   BMI 24.80 kg/m  Gen:   Awake, no distress mental status clear.  Nontoxic.  No respiratory distress. Resp:  Normal effort fine basilar crackle.  Breath sounds symmetric. MSK:   Moves extremities without difficulty no focal motor deficits. Other:  No peripheral edema. Skin warm and dry.  Not noticeably jaundiced.  Medical Decision Making  Medically screening exam initiated at 12:27 PM.  Appropriate orders placed.  Molly [name] was informed that the remainder of the evaluation will be completed by another provider, this initial triage assessment does not replace that evaluation, and the importance of remaining in the ED until their evaluation is complete.     Wynetta Heckle, MD 10/05/23 1235

## 2023-10-05 NOTE — Progress Notes (Signed)
 [facility] Consult Note  Patient Name: Molly [name] MRN: [id number] DOB: [birth year] DATE OF Consult: 10/05/2023  PRIMARY PSYCHIATRIC DIAGNOSES  1.  Alcohol use disorder, severe, with history of withdrawals 2.  Major depressive disorder    RECOMMENDATIONS  Recommendations: Medication recommendations: Continue current medication as prescribes. Please monitor patient for alcohol withdrawal and treat accordingly  Non-Medication/therapeutic recommendations: Recommend a dual diagnosis program if that is available. If not, inpatient rehab  Is inpatient psychiatric hospitalization recommended for this patient? No (Explain why): Patient does not meet criteria for inpatient psychiatric admission  Is another care setting recommended for this patient? (examples may include Crisis Stabilization Unit, Residential/Recovery Treatment, ALF/SNF, Memory Care Unit)  Yes (Explain why): inpatient/ residential treatment for alcohol use disorder  Follow-Up Telepsychiatry C/L services: We will sign off for now. Please re-consult our service if needed for any concerning changes in the patient's condition, discharge planning, or questions.  Thank you for involving [place] in the care of this patient. If you have any additional questions or concerns, please call [phone] and ask for me or the provider on-call.  TELEPSYCHIATRY ATTESTATION & CONSENT  As the provider for this telehealth consult, I attest that I verified the patient's identity using two separate identifiers, introduced myself to the patient, provided my credentials, disclosed my location, and performed this encounter via a HIPAA-compliant, real-time, face-to-face, two-way, interactive audio and video platform and with the full consent and agreement of the patient (or guardian as applicable.)  Patient physical location: [facility] . Telehealth provider physical location: home office in state of  [place].  Video start time: 7 pm  (Central Time) Video end time: 7:30 pm  (Central Time)  IDENTIFYING DATA  Molly [name] is a 68 y.o. year-old female for whom a psychiatric consultation has been ordered by the primary provider. The patient was identified using two separate identifiers.  CHIEF COMPLAINT/REASON FOR CONSULT  I need to quit drinking alcohol   HISTORY OF PRESENT ILLNESS (HPI)  The patient Is a 68 year old female with a history of depression and alcohol use disorder.  She reports having been depressed since her son died in 2015-10-13 of liver failure.  She has been drinking excessively recently and had her daughter, ex-husband, sister and 3 granddaughters pointed out to her.  She was told by her PCP at some point that her liver and kidney numbers were off (they are normal here in the ED) and she wants to quit drinking alcohol.  She says she cannot do it on her own and comes in requesting help. She minimizes her alcohol withdrawal saying that she gets shaky in the morning until she has her first step and then it goes away.  Reports never having had a seizure. She denies suicidal ideation.  Reports compliance with her psychotropic medications. She is in agreement with my recommendation for inpatient rehab for alcohol use disorder.Marland Kitchen  PAST PSYCHIATRIC HISTORY  Depression  Otherwise as per HPI above.  PAST MEDICAL HISTORY  Past Medical History:  Diagnosis Date   Anxiety    COPD (chronic obstructive pulmonary disease) (HCC)    Diabetes mellitus without complication (HCC)    Heart murmur    dx at age 34   Hypertension      HOME MEDICATIONS  Facility Ordered Medications  Medication   [COMPLETED] LORazepam (ATIVAN) tablet 1 mg   PTA Medications  Medication Sig   FLUoxetine (PROZAC) 20 MG capsule Take 60 mg by mouth daily.  clonazePAM (KLONOPIN) 0.5 MG tablet Take 0.5 mg by mouth 2 (two) times daily as needed for anxiety.   aspirin EC 81 MG tablet Take 81 mg by mouth daily.    zolpidem (AMBIEN) 10 MG tablet Take 10 mg by mouth at bedtime as needed for sleep.   atorvastatin (LIPITOR) 40 MG tablet Take 40 mg by mouth daily.   busPIRone (BUSPAR) 15 MG tablet Take 15 mg by mouth 2 (two) times daily.   albuterol (PROVENTIL HFA;VENTOLIN HFA) 108 (90 Base) MCG/ACT inhaler Inhale 2 puffs into the lungs every 6 (six) hours as needed for wheezing or shortness of breath.   tiotropium (SPIRIVA HANDIHALER) 18 MCG inhalation capsule Place 1 capsule (18 mcg total) into inhaler and inhale daily.   telmisartan (MICARDIS) 40 MG tablet Take 40 mg by mouth daily.   cyclobenzaprine (FLEXERIL) 10 MG tablet Take 10 mg by mouth 3 (three) times daily as needed for muscle spasms.   DULoxetine (CYMBALTA) 30 MG capsule Take 30 mg by mouth daily.   meloxicam (MOBIC) 15 MG tablet Take 15 mg by mouth daily as needed.   metFORMIN (GLUCOPHAGE) 500 MG tablet Take 500 mg by mouth 2 (two) times daily.   pantoprazole (PROTONIX) 40 MG tablet Take 40 mg by mouth daily as needed.   tobramycin (TOBREX) 0.3 % ophthalmic solution Place into both eyes every 4 (four) hours.   metoprolol succinate (TOPROL-XL) 50 MG 24 hr tablet Take 1 tablet by mouth every night. Take with or immediately following a meal.   Magnesium Oxide 400 MG CAPS Take 1 capsule (400 mg total) by mouth every Monday, Wednesday, and Friday. Take a half tablet (200 mg) every Saturday, Sunday, Tuesday, Thursday.     ALLERGIES  Allergies  Allergen Reactions   Penicillins Hives    SOCIAL & SUBSTANCE USE HISTORY  Social History   Socioeconomic History   Marital status: Divorced    Spouse name: Not on file   Number of children: 1   Years of education: Not on file   Highest education level: Not on file  Occupational History   Occupation: Disabled due to back problems.  Tobacco Use   Smoking status: Every Day    Current packs/day: 0.50    Average packs/day: 0.5 packs/day for 50.0 years (25.0 ttl pk-yrs)    Types: Cigarettes   Smokeless  tobacco: Never  Substance and Sexual Activity   Alcohol use: Not Currently   Drug use: No   Sexual activity: Not on file  Other Topics Concern   Not on file  Social History Narrative   Son has passed away 10/13/15      Right Handed   Lives in a one story mobile home    Social Drivers of Health   Financial Resource Strain: Not on file  Food Insecurity: Not on file  Transportation Needs: Not on file  Physical Activity: Not on file  Stress: Not on file  Social Connections: Unknown (11/04/2021)   Received from [facility]   Social Network    Social Network: Not on file   Social History   Tobacco Use  Smoking Status Every Day   Current packs/day: 0.50   Average packs/day: 0.5 packs/day for 50.0 years (25.0 ttl pk-yrs)   Types: Cigarettes  Smokeless Tobacco Never   Social History   Substance and Sexual Activity  Alcohol Use Not Currently   Social History   Substance and Sexual Activity  Drug Use No     FAMILY  HISTORY  Family History  Problem Relation Age of Onset   Heart failure Mother    Hypertension Mother    Diabetes Mother    Heart failure Father    Sudden Cardiac Death Neg Hx    Family Psychiatric History (if known):  alcohol use disorder   MENTAL STATUS EXAM (MSE)  Mental Status Exam: General Appearance: Casual  Orientation:  Full (Time, Place, and Person)  Memory:  Immediate;   Fair  Concentration:  Concentration: Fair  Recall:  Fair  Attention  Fair  Eye Contact:  Good  Speech:  Clear and Coherent  Language:  Good  Volume:  Normal  Mood: anxious  Affect:  Appropriate  Thought Process:  Goal Directed  Thought Content:  Logical  Suicidal Thoughts:  No  Homicidal Thoughts:  No  Judgement:  Fair  Insight:  Fair  Psychomotor Activity:  Normal  Akathisia:  Negative  Fund of Knowledge:  Fair    Assets:  Manufacturing systems engineer Desire for Improvement Housing Social Support  Cognition:  WNL  ADL's:  Intact  AIMS (if indicated):        VITALS  Blood pressure (!) 148/95, pulse 84, temperature 98.4 F (36.9 C), temperature source Oral, resp. rate 19, height 5\' 3"  (1.6 m), weight 63.5 kg, SpO2 95%.  LABS  Admission on 10/05/2023  Component Date Value Ref Range Status   Sodium 10/05/2023 139  135 - 145 mmol/L Final   Potassium 10/05/2023 5.0  3.5 - 5.1 mmol/L Final   Chloride 10/05/2023 106  98 - 111 mmol/L Final   CO2 10/05/2023 22  22 - 32 mmol/L Final   Glucose, Bld 10/05/2023 125 (H)  70 - 99 mg/dL Final   Glucose reference range applies only to samples taken after fasting for at least 8 hours.   BUN 10/05/2023 13  8 - 23 mg/dL Final   Creatinine, Ser 10/05/2023 0.53  0.44 - 1.00 mg/dL Final   Calcium 16/03/9603 9.1  8.9 - 10.3 mg/dL Final   Total Protein 54/02/8118 6.1 (L)  6.5 - 8.1 g/dL Final   Albumin 14/78/2956 3.3 (L)  3.5 - 5.0 g/dL Final   AST 21/30/8657 29  15 - 41 U/L Final   ALT 10/05/2023 38  0 - 44 U/L Final   Alkaline Phosphatase 10/05/2023 106  38 - 126 U/L Final   Total Bilirubin 10/05/2023 0.4  0.0 - 1.2 mg/dL Final   GFR, Estimated 10/05/2023 >60  >60 mL/min Final   Comment: (NOTE) Calculated using the CKD-EPI Creatinine Equation (2021)    Anion gap 10/05/2023 11  5 - 15 Final   Performed at [facility], [address], [place], [place] [address]   Alcohol, Ethyl (B) 10/05/2023 21 (H)  <10 mg/dL Final   Comment: (NOTE) Lowest detectable limit for serum alcohol is 10 mg/dL.  For medical purposes only. Performed at [facility], [address], [place], [place] [address]    Opiates 10/05/2023 NONE DETECTED  NONE DETECTED Final   Cocaine 10/05/2023 NONE DETECTED  NONE DETECTED Final   Benzodiazepines 10/05/2023 NONE DETECTED  NONE DETECTED Final   Amphetamines 10/05/2023 NONE DETECTED  NONE DETECTED Final   Tetrahydrocannabinol 10/05/2023 NONE DETECTED  NONE DETECTED Final   Barbiturates 10/05/2023 NONE DETECTED  NONE DETECTED Final   Comment: (NOTE) DRUG SCREEN FOR MEDICAL  PURPOSES ONLY.  IF CONFIRMATION IS NEEDED FOR ANY PURPOSE, NOTIFY LAB WITHIN 5 DAYS.  LOWEST DETECTABLE LIMITS FOR URINE DRUG SCREEN Drug  Class                     Cutoff (ng/mL) Amphetamine and metabolites    1000 Barbiturate and metabolites    200 Benzodiazepine                 200 Opiates and metabolites        300 Cocaine and metabolites        300 THC                            50 Performed at [facility], [address], [place], [place] [address]    Lipase 10/05/2023 24  11 - 51 U/L Final   Performed at [facility], [address], [place], [place] [address]   WBC 10/05/2023 8.2  4.0 - 10.5 K/uL Final   RBC 10/05/2023 4.40  3.87 - 5.11 MIL/uL Final   Hemoglobin 10/05/2023 14.2  12.0 - 15.0 g/dL Final   HCT 09/81/1914 43.0  36.0 - 46.0 % Final   MCV 10/05/2023 97.7  80.0 - 100.0 fL Final   MCH 10/05/2023 32.3  26.0 - 34.0 pg Final   MCHC 10/05/2023 33.0  30.0 - 36.0 g/dL Final   RDW 78/29/5621 14.6  11.5 - 15.5 % Final   Platelets 10/05/2023 153  150 - 400 K/uL Final   Comment: REPEATED TO VERIFY PLATELET COUNT CONFIRMED BY SMEAR    nRBC 10/05/2023 0.0  0.0 - 0.2 % Final   Neutrophils Relative % 10/05/2023 64  % Final   Neutro Abs 10/05/2023 5.3  1.7 - 7.7 K/uL Final   Lymphocytes Relative 10/05/2023 25  % Final   Lymphs Abs 10/05/2023 2.0  0.7 - 4.0 K/uL Final   Monocytes Relative 10/05/2023 5  % Final   Monocytes Absolute 10/05/2023 0.4  0.1 - 1.0 K/uL Final   Eosinophils Relative 10/05/2023 5  % Final   Eosinophils Absolute 10/05/2023 0.4  0.0 - 0.5 K/uL Final   Basophils Relative 10/05/2023 1  % Final   Basophils Absolute 10/05/2023 0.1  0.0 - 0.1 K/uL Final   Immature Granulocytes 10/05/2023 0  % Final   Abs Immature Granulocytes 10/05/2023 0.02  0.00 - 0.07 K/uL Final   Performed at [facility], [address], [place], [place] [address]   Ammonia 10/05/2023 30  9 - 35 umol/L Final   Comment: HEMOLYSIS AT THIS LEVEL MAY AFFECT  RESULT Performed at [facility], [address], [place], [place] [address]     PSYCHIATRIC REVIEW OF SYSTEMS (ROS)  ROS: Notable for the following relevant positive findings: ROS  Additional findings:      Musculoskeletal: No abnormal movements observed      Gait & Station: Normal      Pain Screening: Denies        RISK FORMULATION/ASSESSMENT  Is the patient experiencing any suicidal or homicidal ideations: No     Protective factors considered for safety management: Help seeking   Risk factors/concerns considered for safety management:  Substance abuse/dependence  Is there a safety management plan with the patient and treatment team to minimize risk factors and promote protective factors: Yes           Explain: Inpatient rehab  Is crisis care placement or psychiatric hospitalization recommended: No     Based on my current evaluation and risk assessment, patient is determined at this  time to be at:  Low risk  *RISK ASSESSMENT Risk assessment is a dynamic process; it is possible that this patient's condition, and risk level, may change. This should be re-evaluated and managed over time as appropriate. Please re-consult psychiatric consult services if additional assistance is needed in terms of risk assessment and management. If your team decides to discharge this patient, please advise the patient how to best access emergency psychiatric services, or to call 911, if their condition worsens or they feel unsafe in any way.   Davey Erp, MD Telepsychiatry Consult ServicesPatient ID: Leotis Range, female   DOB: 06/21/1956, 68 y.o.   MRN: [id number]

## 2023-10-05 NOTE — ED Provider Notes (Cosign Needed Addendum)
 [place] [facility] Provider Note   CSN: [id number] Arrival date & time: 10/05/23  1155     History  Chief Complaint  Patient presents with   Alcohol Problem    Pt's PCP sent pt due to elevated liver enzymes.     Molly [name] is a 68 y.o. female.  Patient presents to the emergency department requesting help with alcohol abuse and depression.  Patient reports that her ex-husband told her that she is drinking 1/2 gallon of vodka every 2 days.  Patient states she did not realize that she was drinking this much.  Patient states that she began drinking heavy after the death of her son.  Her son died from cirrhosis.  Patient reports that she has had problems with depression since the death of her son.  Patient states that she is on medications for depression.  Patient is motivated to come to the emergency department today for help with depression and alcohol abuse by her granddaughter who has asked her to get help.  Patient states that she does have withdrawal when she does not drink.  Patient states she is shaky when she gets up in the morning until she has her first drink.  Patient reports that her primary care doctor is treating her for anxiety and depression.  Patient states that she is taking Klonopin twice a day for anxiety, BuSpar for anxiety.  Patient is taking Cymbalta and Prozac for depression.  Patient reports her primary care doctor has told her that her liver functions have been elevated in the past and that she has had protein in her urine in the past. Pt reports she has also had frequent falls. Pt reports she frequently feels off balance  The history is provided by the patient. No language interpreter was used.  Alcohol Problem       Home Medications Prior to Admission medications   Medication Sig Start Date End Date Taking? Authorizing Provider  albuterol (PROVENTIL HFA;VENTOLIN HFA) 108 (90 Base) MCG/ACT inhaler Inhale 2 puffs into the  lungs every 6 (six) hours as needed for wheezing or shortness of breath. 05/15/17   Justina Oman, MD  aspirin EC 81 MG tablet Take 81 mg by mouth daily.    [provider]  atorvastatin (LIPITOR) 40 MG tablet Take 40 mg by mouth daily.    [provider]  busPIRone (BUSPAR) 15 MG tablet Take 15 mg by mouth 2 (two) times daily.    [provider]  clonazePAM (KLONOPIN) 0.5 MG tablet Take 0.5 mg by mouth 2 (two) times daily as needed for anxiety.    [provider]  cyclobenzaprine (FLEXERIL) 10 MG tablet Take 10 mg by mouth 3 (three) times daily as needed for muscle spasms.    [provider]  DULoxetine (CYMBALTA) 30 MG capsule Take 30 mg by mouth daily. 02/21/23   [provider]  FLUoxetine (PROZAC) 20 MG capsule Take 60 mg by mouth daily.    [provider]  Magnesium Oxide 400 MG CAPS Take 1 capsule (400 mg total) by mouth every Monday, Wednesday, and Friday. Take a half tablet (200 mg) every Saturday, Sunday, Tuesday, Thursday. 06/30/23   Jacqueline Matsu, MD  meloxicam (MOBIC) 15 MG tablet Take 15 mg by mouth daily as needed. 03/10/23   [provider]  metFORMIN (GLUCOPHAGE) 500 MG tablet Take 500 mg by mouth 2 (two) times daily. 02/26/23   [provider]  metoprolol succinate (TOPROL-XL)  50 MG 24 hr tablet Take 1 tablet by mouth every night. Take with or immediately following a meal. 03/22/23   Von Grumbling, PA-C  pantoprazole (PROTONIX) 40 MG tablet Take 40 mg by mouth daily as needed. 01/25/23   [provider]  telmisartan (MICARDIS) 40 MG tablet Take 40 mg by mouth daily. 12/07/17   [provider]  tiotropium (SPIRIVA HANDIHALER) 18 MCG inhalation capsule Place 1 capsule (18 mcg total) into inhaler and inhale daily. 05/15/17 08/01/21  Justina Oman, MD  tobramycin (TOBREX) 0.3 % ophthalmic solution Place into both eyes every 4 (four) hours. 02/25/23   [provider]  zolpidem (AMBIEN) 10  MG tablet Take 10 mg by mouth at bedtime as needed for sleep.    [provider]      Allergies    Penicillins    Review of Systems   Review of Systems  All other systems reviewed and are negative.   Physical Exam Updated Vital Signs BP (!) 148/95 (BP Location: Left Arm)   Pulse 84   Temp 98.4 F (36.9 C) (Oral)   Resp 19   Ht 5\' 3"  (1.6 m)   Wt 63.5 kg   SpO2 95%   BMI 24.80 kg/m  Physical Exam Vitals and nursing note reviewed.  Constitutional:      Appearance: She is well-developed.  HENT:     Head: Normocephalic.     Right Ear: Tympanic membrane normal.     Left Ear: Tympanic membrane normal.     Mouth/Throat:     Mouth: Mucous membranes are moist.  Cardiovascular:     Rate and Rhythm: Normal rate.  Pulmonary:     Effort: Pulmonary effort is normal.  Abdominal:     General: There is no distension.  Musculoskeletal:        General: Normal range of motion.     Cervical back: Normal range of motion.  Skin:    General: Skin is warm.  Neurological:     General: No focal deficit present.     Mental Status: She is alert and oriented to person, place, and time.  Psychiatric:        Mood and Affect: Mood normal.     ED Results / Procedures / Treatments   Labs (all labs ordered are listed, but only abnormal results are displayed) Labs Reviewed  COMPREHENSIVE METABOLIC PANEL WITH GFR - Abnormal; Notable for the following components:      Result Value   Glucose, Bld 125 (*)    Total Protein 6.1 (*)    Albumin 3.3 (*)    All other components within normal limits  ETHANOL - Abnormal; Notable for the following components:   Alcohol, Ethyl (B) 21 (*)    All other components within normal limits  LIPASE, BLOOD  CBC WITH DIFFERENTIAL/PLATELET  AMMONIA  RAPID URINE DRUG SCREEN, HOSP PERFORMED    EKG None  Radiology CT Head Wo Contrast Result Date: 10/05/2023 CLINICAL DATA:  Alcohol dependency.  Head trauma, minor. EXAM: CT HEAD WITHOUT CONTRAST  TECHNIQUE: Contiguous axial images were obtained from the base of the skull through the vertex without intravenous contrast. RADIATION DOSE REDUCTION: This exam was performed according to the departmental dose-optimization program which includes automated exposure control, adjustment of the mA and/or kV according to patient size and/or use of iterative reconstruction technique. COMPARISON:  05/27/2009 FINDINGS: Brain: Generalized brain atrophy without subjective lobar predominance. Chronic small-vessel ischemic changes of the cerebral hemispheric white matter. No large  vessel territory stroke. No mass, hemorrhage, hydrocephalus or extra-axial collection. Vascular: There is atherosclerotic calcification of the major vessels at the base of the brain. Skull: Negative Sinuses/Orbits: Clear/normal Other: None IMPRESSION: No acute CT finding. Generalized brain atrophy. Chronic small-vessel ischemic changes of the cerebral hemispheric white matter. Electronically Signed   By: Bettylou Brunner M.D.   On: 10/05/2023 14:33    Procedures Procedures    Medications Ordered in ED Medications - No data to display  ED Course/ Medical Decision Making/ A&P                                 Medical Decision Making Patient is requesting treatment for alcohol abuse and depression.  Amount and/or Complexity of Data Reviewed Labs: ordered. Decision-making details documented in ED Course.    Details: Labs ordered reviewed and interpreted Etoh is 21 Radiology: ordered and independent interpretation performed. Decision-making details documented in ED Course.    Details: Ct head  no acute abnormality   Risk Prescription drug management. Risk Details: TTS consulted. Transitions of care consulted         TTS evaluated and advised inpatient treatment   Final Clinical Impression(s) / ED Diagnoses Final diagnoses:  Alcohol abuse  Depression, unspecified depression type    Rx / DC Orders ED Discharge Orders      None         Sandi Crosby, PA-C 10/05/23 1812    Sandi Crosby, PA-C 10/05/23 1847    Sandi Crosby, PA-C 10/05/23 2125

## 2023-10-05 NOTE — ED Notes (Signed)
TTS in process-Monique,RN  

## 2023-10-05 NOTE — BH Assessment (Signed)
 TTS Note:   @ 7:05 PM, the patient was deferred to IRIS. The IRIS coordinator, Lauryn has received the request. The patient's care team has been updated on the disposition. The IRIS coordinator will notify the care team when the IRIS provider is ready to begin the patient's assessment. If there are any questions, the care team should contact the IRIS coordinator at [phone]

## 2023-10-05 NOTE — ED Triage Notes (Signed)
 Pt. Was instructed to come to ED due to elevated liver enzymes.  Pt. Stated, "Im and alcoholic and would like help to stop" Denies any abdominal pain, n/v Pt. Has intermittent diarrhea. Pt has a hx of falling at home.  She feels off balance.  Her last drink was this am.  Pt.'s last fall was 1 month ago.   Pt. Is alert and oriented X 3.  Skin is p/w/d   Having lt. Lateral neck pain.

## 2023-10-06 ENCOUNTER — Encounter (HOSPITAL_COMMUNITY): Payer: Self-pay | Admitting: Psychiatry

## 2023-10-06 ENCOUNTER — Other Ambulatory Visit (HOSPITAL_COMMUNITY)
Admission: EM | Admit: 2023-10-06 | Discharge: 2023-10-07 | Disposition: A | Discharge status: Home / Self Care | Attending: Psychiatry | Admitting: Psychiatry

## 2023-10-06 DIAGNOSIS — F419 Anxiety disorder, unspecified: Secondary | ICD-10-CM | POA: Diagnosis present

## 2023-10-06 DIAGNOSIS — F1014 Alcohol abuse with alcohol-induced mood disorder: Secondary | ICD-10-CM | POA: Diagnosis not present

## 2023-10-06 DIAGNOSIS — R4589 Other symptoms and signs involving emotional state: Secondary | ICD-10-CM

## 2023-10-06 DIAGNOSIS — J449 Chronic obstructive pulmonary disease, unspecified: Secondary | ICD-10-CM | POA: Diagnosis present

## 2023-10-06 DIAGNOSIS — F411 Generalized anxiety disorder: Secondary | ICD-10-CM | POA: Insufficient documentation

## 2023-10-06 DIAGNOSIS — F1010 Alcohol abuse, uncomplicated: Secondary | ICD-10-CM | POA: Diagnosis present

## 2023-10-06 DIAGNOSIS — F418 Other specified anxiety disorders: Secondary | ICD-10-CM | POA: Diagnosis present

## 2023-10-06 DIAGNOSIS — F17210 Nicotine dependence, cigarettes, uncomplicated: Secondary | ICD-10-CM | POA: Diagnosis not present

## 2023-10-06 DIAGNOSIS — F32A Depression, unspecified: Secondary | ICD-10-CM | POA: Diagnosis present

## 2023-10-06 LAB — SARS CORONAVIRUS 2 BY RT PCR: SARS Coronavirus 2 by RT PCR: NEGATIVE

## 2023-10-06 MED ORDER — NICOTINE 14 MG/24HR TD PT24
14.0000 mg | MEDICATED_PATCH | Freq: Every day | TRANSDERMAL | Status: DC
  Administered 2023-10-06 – 2023-10-07 (×2): 14 mg via TRANSDERMAL
  Filled 2023-10-06 (×2): qty 1

## 2023-10-06 MED ORDER — LORAZEPAM 1 MG PO TABS
1.0000 mg | ORAL_TABLET | Freq: Four times a day (QID) | ORAL | Status: AC
  Administered 2023-10-06 (×4): 1 mg via ORAL
  Filled 2023-10-06 (×4): qty 1

## 2023-10-06 MED ORDER — IRBESARTAN 150 MG PO TABS
150.0000 mg | ORAL_TABLET | Freq: Every day | ORAL | Status: DC
  Administered 2023-10-06 – 2023-10-07 (×2): 150 mg via ORAL
  Filled 2023-10-06 (×2): qty 1

## 2023-10-06 MED ORDER — OLANZAPINE 5 MG PO TBDP: 5.0000 mg | ORAL_TABLET | Freq: Three times a day (TID) | ORAL | Status: DC | PRN

## 2023-10-06 MED ORDER — METFORMIN HCL 500 MG PO TABS
500.0000 mg | ORAL_TABLET | Freq: Two times a day (BID) | ORAL | Status: DC
  Administered 2023-10-06 – 2023-10-07 (×2): 500 mg via ORAL
  Filled 2023-10-06 (×2): qty 1

## 2023-10-06 MED ORDER — THIAMINE MONONITRATE 100 MG PO TABS
100.0000 mg | ORAL_TABLET | Freq: Every day | ORAL | Status: DC
  Administered 2023-10-07: 100 mg via ORAL
  Filled 2023-10-06: qty 1

## 2023-10-06 MED ORDER — OLANZAPINE 10 MG IM SOLR: 5.0000 mg | Freq: Three times a day (TID) | INTRAMUSCULAR | Status: DC | PRN

## 2023-10-06 MED ORDER — LORAZEPAM 1 MG PO TABS: 1.0000 mg | ORAL_TABLET | Freq: Four times a day (QID) | ORAL | Status: DC | PRN

## 2023-10-06 MED ORDER — ATORVASTATIN CALCIUM 40 MG PO TABS
40.0000 mg | ORAL_TABLET | Freq: Every day | ORAL | Status: DC
  Administered 2023-10-06 – 2023-10-07 (×2): 40 mg via ORAL
  Filled 2023-10-06 (×2): qty 1

## 2023-10-06 MED ORDER — THIAMINE HCL 100 MG/ML IJ SOLN
100.0000 mg | Freq: Once | INTRAMUSCULAR | Status: DC
  Filled 2023-10-06: qty 2

## 2023-10-06 MED ORDER — LORAZEPAM 1 MG PO TABS: 1.0000 mg | ORAL_TABLET | Freq: Every day | ORAL | Status: DC

## 2023-10-06 MED ORDER — ESCITALOPRAM OXALATE 10 MG PO TABS
10.0000 mg | ORAL_TABLET | Freq: Every day | ORAL | Status: DC
  Administered 2023-10-06 – 2023-10-07 (×2): 10 mg via ORAL
  Filled 2023-10-06 (×2): qty 1

## 2023-10-06 MED ORDER — LOPERAMIDE HCL 2 MG PO CAPS: 2.0000 mg | ORAL_CAPSULE | ORAL | Status: DC | PRN

## 2023-10-06 MED ORDER — LORAZEPAM 1 MG PO TABS: 1.0000 mg | ORAL_TABLET | Freq: Two times a day (BID) | ORAL | Status: DC

## 2023-10-06 MED ORDER — ADULT MULTIVITAMIN W/MINERALS CH
1.0000 | ORAL_TABLET | Freq: Every day | ORAL | Status: DC
  Administered 2023-10-06 – 2023-10-07 (×2): 1 via ORAL
  Filled 2023-10-06 (×2): qty 1

## 2023-10-06 MED ORDER — ACETAMINOPHEN 325 MG PO TABS
650.0000 mg | ORAL_TABLET | Freq: Four times a day (QID) | ORAL | Status: DC | PRN
  Administered 2023-10-06 – 2023-10-07 (×2): 650 mg via ORAL
  Filled 2023-10-06 (×2): qty 2

## 2023-10-06 MED ORDER — ONDANSETRON 4 MG PO TBDP: 4.0000 mg | ORAL_TABLET | Freq: Four times a day (QID) | ORAL | Status: DC | PRN

## 2023-10-06 MED ORDER — ASPIRIN 81 MG PO TBEC
81.0000 mg | DELAYED_RELEASE_TABLET | Freq: Every day | ORAL | Status: DC
  Administered 2023-10-06 – 2023-10-07 (×2): 81 mg via ORAL
  Filled 2023-10-06 (×2): qty 1

## 2023-10-06 MED ORDER — METOPROLOL SUCCINATE ER 50 MG PO TB24
50.0000 mg | ORAL_TABLET | Freq: Every day | ORAL | Status: DC
  Administered 2023-10-06 – 2023-10-07 (×2): 50 mg via ORAL
  Filled 2023-10-06 (×2): qty 1

## 2023-10-06 MED ORDER — ALBUTEROL SULFATE HFA 108 (90 BASE) MCG/ACT IN AERS
2.0000 | INHALATION_SPRAY | Freq: Four times a day (QID) | RESPIRATORY_TRACT | Status: DC | PRN
  Administered 2023-10-06: 2 via RESPIRATORY_TRACT
  Filled 2023-10-06: qty 6.7

## 2023-10-06 MED ORDER — HYDROXYZINE HCL 25 MG PO TABS: 25.0000 mg | ORAL_TABLET | Freq: Four times a day (QID) | ORAL | Status: DC | PRN

## 2023-10-06 MED ORDER — LORAZEPAM 1 MG PO TABS
1.0000 mg | ORAL_TABLET | Freq: Three times a day (TID) | ORAL | Status: DC
  Administered 2023-10-07: 1 mg via ORAL
  Filled 2023-10-06: qty 1

## 2023-10-06 MED ORDER — MAGNESIUM HYDROXIDE 400 MG/5ML PO SUSP: 30.0000 mL | Freq: Every day | ORAL | Status: DC | PRN

## 2023-10-06 MED ORDER — HYDROXYZINE HCL 25 MG PO TABS
25.0000 mg | ORAL_TABLET | Freq: Four times a day (QID) | ORAL | Status: DC | PRN
Start: 2023-10-06 — End: 2023-10-07

## 2023-10-06 MED ORDER — VITAMIN D (ERGOCALCIFEROL) 1.25 MG (50000 UNIT) PO CAPS
50000.0000 [IU] | ORAL_CAPSULE | ORAL | Status: DC
  Administered 2023-10-06: 50000 [IU] via ORAL
  Filled 2023-10-06: qty 1

## 2023-10-06 MED ORDER — ALUM & MAG HYDROXIDE-SIMETH 200-200-20 MG/5ML PO SUSP: 30.0000 mL | ORAL | Status: DC | PRN

## 2023-10-06 MED ORDER — MELATONIN 5 MG PO TABS: 5.0000 mg | ORAL_TABLET | Freq: Every evening | ORAL | Status: DC | PRN

## 2023-10-06 NOTE — ED Notes (Signed)
 Patient presents to [facility], direct admit from [facility] seeking help with alcohol abuse and depression. Patient states that she began drinking heavy after the death of her son who died from cirrhosis. Patient reports that she has had problems with depression since the death of her son. Patient states that she does have withdrawal when she does not drink. Patient states she is shaky when she gets up in the morning until she has her first drink. Writer has provided pt with water at bedside to enable pt have a drink when she gets up before walking out of her room. Pt reports she has also had frequent falls. Pt has fall risk armband on, non skid socks on and education has been provided by writer to pt on preventing falls on the unit, teach back method was used in education. Pt verbalized understanding, and stated " I will call for help when needed". Admission process is completed. Routine safety checks conducted according to facility protocol.

## 2023-10-06 NOTE — ED Notes (Signed)
 Patient sitting in bedroom calm and composed. No acute distress noted. No concerns voiced. Informed patient to notify staff with any needs or assistance. Patient verbalized understanding or agreement. Safety checks in place per facility policy.

## 2023-10-06 NOTE — ED Provider Notes (Addendum)
 Facility Based Crisis Admission H&P  Date: 10/07/23 Patient Name: Molly [name] MRN: [id number] Chief Complaint: alcohol   Diagnoses:  Final diagnoses:  Alcohol abuse  Anxious appearance    HPI: Molly [name], 68 y/o female with history of alcohol abuse, presented to [facility] as a transfer from the ED.  Patient is here for detox.  According to the patient she consume 1 to 2 glasses of wine daily sometimes she will skip a day.  Patient stated that she is currently not seeing a psychiatrist or therapist.  Patient denies ever doing detox before.  According to the patient she lives at home with her ex-husband, her daughter, and 3 grandchildren.  Patient is retired.   See ED notes: Patient presents to the emergency department requesting help with alcohol abuse and depression. Patient reports that her ex-husband told her that she is drinking 1/2 gallon of vodka every 2 days. Patient states she did not realize that she was drinking this much. Patient states that she began drinking heavy after the death of her son. Her son died from cirrhosis. Patient reports that she has had problems with depression since the death of her son. Patient states that she is on medications for depression. Patient is motivated to come to the emergency department today for help with depression and alcohol abuse by her granddaughter who has asked her to get help. Patient states that she does have withdrawal when she does not drink. Patient states she is shaky when she gets up in the morning until she has her first drink. Patient reports that her primary care doctor is treating her for anxiety and depression. Patient states that she is taking Klonopin twice a day for anxiety, BuSpar for anxiety. Patient is taking Cymbalta and Prozac for depression. Patient reports her primary care doctor has told her that her liver functions have been elevated in the past and that she has had protein in her urine in the past. Pt reports she has  also had frequent falls. Pt reports she frequently feels off balance   Face to face observation of patient, patient is alert and oriented x 4, speech is clear, maintaining eye contact.  Patient denies SI, HI, AVH or paranoia.  Patient denies illicit drug use.  Reports she only smokes cigarettes.  Patient reports she started drinking after her son had passed away.  At this present moment patient does not appear to be in any distress does not show any physical sign of withdrawal.  Does not seem to be a risk to herself or others.  Patient stated that she came in trying to get detox because of family concerns.  Discussed with patient the detox process patient in agreement with plan.  PHQ9 completed  pt scored 10  Recommend inpatient [facility]  PHQ 2-9:  Flowsheet Row ED from 10/06/2023 in [facility]  Thoughts that you would be better off dead, or of hurting yourself in some way Not at all  PHQ-9 Total Score 10       Flowsheet Row ED from 10/06/2023 in [facility] ED from 10/05/2023 in [facility] Emergency Department at [facility]  C-SSRS RISK CATEGORY No Risk No Risk       Screenings    Flowsheet Row Most Recent Value  CIWA-Ar Total 0       Total Time spent with patient: 20 minutes  Musculoskeletal  Strength & Muscle Tone: within normal limits Gait & Station: normal Patient leans:  N/A  Psychiatric Specialty Exam  Presentation General Appearance:  Appropriate for Environment  Eye Contact: Good  Speech: Clear and Coherent  Speech Volume: Normal  Handedness: Right   Mood and Affect  Mood: Anxious  Affect: Appropriate   Thought Process  Thought Processes: Coherent  Descriptions of Associations:Intact  Orientation:Full (Time, Place and Person)  Thought Content:Logical    Hallucinations:Hallucinations: None  Ideas of Reference:None  Suicidal Thoughts:Suicidal Thoughts: No  Homicidal  Thoughts:Homicidal Thoughts: No   Sensorium  Memory: Immediate Good  Judgment: Fair  Insight: Fair   Art therapist  Concentration: Fair  Attention Span: Good  Recall: Good  Fund of Knowledge: Good  Language: Good   Psychomotor Activity  Psychomotor Activity: Psychomotor Activity: Normal   Assets  Assets: Desire for Improvement   Sleep  Sleep: Sleep: Fair Number of Hours of Sleep: 6   Nutritional Assessment (For OBS and FBC admissions only) Has the patient had a weight loss or gain of 10 pounds or more in the last 3 months?: No Has the patient had a decrease in food intake/or appetite?: No Does the patient have dental problems?: No Does the patient have eating habits or behaviors that may be indicators of an eating disorder including binging or inducing vomiting?: No Has the patient recently lost weight without trying?: 0 Has the patient been eating poorly because of a decreased appetite?: 0 Malnutrition Screening Tool Score: 0    Physical Exam HENT:     Head: Normocephalic.     Nose: Nose normal.  Eyes:     Pupils: Pupils are equal, round, and reactive to light.  Cardiovascular:     Rate and Rhythm: Normal rate.  Pulmonary:     Effort: Pulmonary effort is normal.  Musculoskeletal:        General: Normal range of motion.     Cervical back: Normal range of motion.  Neurological:     General: No focal deficit present.     Mental Status: She is alert.  Psychiatric:        Mood and Affect: Mood normal.        Behavior: Behavior normal.        Thought Content: Thought content normal.        Judgment: Judgment normal.    Review of Systems  Constitutional: Negative.   HENT: Negative.    Eyes: Negative.   Respiratory: Negative.    Cardiovascular: Negative.   Gastrointestinal: Negative.   Genitourinary: Negative.   Musculoskeletal: Negative.   Skin: Negative.   Neurological: Negative.   Psychiatric/Behavioral:  Positive for  substance abuse. The patient is nervous/anxious.     Blood pressure (!) 143/73, pulse 81, temperature 97.9 F (36.6 C), temperature source Oral, resp. rate 16, SpO2 94%. There is no height or weight on file to calculate BMI.  Past Psychiatric History: alcohol abuse   Is the patient at risk to self? No  Has the patient been a risk to self in the past 6 months? No .    Has the patient been a risk to self within the distant past? No   Is the patient a risk to others? No   Has the patient been a risk to others in the past 6 months? No   Has the patient been a risk to others within the distant past? No   Past Medical History: see chart  Family History: unknown Social History: alcohol, tobacco  Last Labs:  Admission on 10/06/2023  Component Date Value Ref Range  Status   SARS Coronavirus 2 by RT PCR 10/06/2023 NEGATIVE  NEGATIVE Final   Performed at [facility], [address] [place], [place] [address]  Admission on 10/05/2023, Discharged on 10/06/2023  Component Date Value Ref Range Status   Sodium 10/05/2023 139  135 - 145 mmol/L Final   Potassium 10/05/2023 5.0  3.5 - 5.1 mmol/L Final   Chloride 10/05/2023 106  98 - 111 mmol/L Final   CO2 10/05/2023 22  22 - 32 mmol/L Final   Glucose, Bld 10/05/2023 125 (H)  70 - 99 mg/dL Final   Glucose reference range applies only to samples taken after fasting for at least 8 hours.   BUN 10/05/2023 13  8 - 23 mg/dL Final   Creatinine, Ser 10/05/2023 0.53  0.44 - 1.00 mg/dL Final   Calcium 91/47/8295 9.1  8.9 - 10.3 mg/dL Final   Total Protein 62/13/0865 6.1 (L)  6.5 - 8.1 g/dL Final   Albumin 78/46/9629 3.3 (L)  3.5 - 5.0 g/dL Final   AST 52/84/1324 29  15 - 41 U/L Final   ALT 10/05/2023 38  0 - 44 U/L Final   Alkaline Phosphatase 10/05/2023 106  38 - 126 U/L Final   Total Bilirubin 10/05/2023 0.4  0.0 - 1.2 mg/dL Final   GFR, Estimated 10/05/2023 >60  >60 mL/min Final   Comment: (NOTE) Calculated using the CKD-EPI Creatinine  Equation (2021)    Anion gap 10/05/2023 11  5 - 15 Final   Performed at [facility], [address] [place], [place] [address]   Alcohol, Ethyl (B) 10/05/2023 21 (H)  <10 mg/dL Final   Comment: (NOTE) Lowest detectable limit for serum alcohol is 10 mg/dL.  For medical purposes only. Performed at [facility], [address], [place], [place] [address]    Opiates 10/05/2023 NONE DETECTED  NONE DETECTED Final   Cocaine 10/05/2023 NONE DETECTED  NONE DETECTED Final   Benzodiazepines 10/05/2023 NONE DETECTED  NONE DETECTED Final   Amphetamines 10/05/2023 NONE DETECTED  NONE DETECTED Final   Tetrahydrocannabinol 10/05/2023 NONE DETECTED  NONE DETECTED Final   Barbiturates 10/05/2023 NONE DETECTED  NONE DETECTED Final   Comment: (NOTE) DRUG SCREEN FOR MEDICAL PURPOSES ONLY.  IF CONFIRMATION IS NEEDED FOR ANY PURPOSE, NOTIFY LAB WITHIN 5 DAYS.  LOWEST DETECTABLE LIMITS FOR URINE DRUG SCREEN Drug Class                     Cutoff (ng/mL) Amphetamine and metabolites    1000 Barbiturate and metabolites    200 Benzodiazepine                 200 Opiates and metabolites        300 Cocaine and metabolites        300 THC                            50 Performed at [facility], [address], [place], [place] [address]    Lipase 10/05/2023 24  11 - 51 U/L Final   Performed at [facility], [address], [place], [place] [address]   WBC 10/05/2023 8.2  4.0 - 10.5 K/uL Final   RBC 10/05/2023 4.40  3.87 - 5.11 MIL/uL Final   Hemoglobin 10/05/2023 14.2  12.0 - 15.0 g/dL Final   HCT 95/63/8756 43.0  36.0 - 46.0 % Final   MCV  10/05/2023 97.7  80.0 - 100.0 fL Final   MCH 10/05/2023 32.3  26.0 - 34.0 pg Final   MCHC 10/05/2023 33.0  30.0 - 36.0 g/dL Final   RDW 19/14/7829 14.6  11.5 - 15.5 % Final   Platelets 10/05/2023 153  150 - 400 K/uL Final   Comment: REPEATED TO VERIFY PLATELET COUNT CONFIRMED BY SMEAR    nRBC 10/05/2023 0.0  0.0 - 0.2 % Final    Neutrophils Relative % 10/05/2023 64  % Final   Neutro Abs 10/05/2023 5.3  1.7 - 7.7 K/uL Final   Lymphocytes Relative 10/05/2023 25  % Final   Lymphs Abs 10/05/2023 2.0  0.7 - 4.0 K/uL Final   Monocytes Relative 10/05/2023 5  % Final   Monocytes Absolute 10/05/2023 0.4  0.1 - 1.0 K/uL Final   Eosinophils Relative 10/05/2023 5  % Final   Eosinophils Absolute 10/05/2023 0.4  0.0 - 0.5 K/uL Final   Basophils Relative 10/05/2023 1  % Final   Basophils Absolute 10/05/2023 0.1  0.0 - 0.1 K/uL Final   Immature Granulocytes 10/05/2023 0  % Final   Abs Immature Granulocytes 10/05/2023 0.02  0.00 - 0.07 K/uL Final   Performed at [facility], [address], [place], [place] [address]   Ammonia 10/05/2023 30  9 - 35 umol/L Final   Comment: HEMOLYSIS AT THIS LEVEL MAY AFFECT RESULT Performed at [facility], [address] [place], [place] [address]     Allergies: Penicillins  Medications:  Facility Ordered Medications  Medication   [COMPLETED] LORazepam (ATIVAN) tablet 1 mg   acetaminophen (TYLENOL) tablet 650 mg   alum & mag hydroxide-simeth (MAALOX/MYLANTA) 200-200-20 MG/5ML suspension 30 mL   magnesium hydroxide (MILK OF MAGNESIA) suspension 30 mL   thiamine (VITAMIN B1) injection 100 mg   thiamine (VITAMIN B1) tablet 100 mg   multivitamin with minerals tablet 1 tablet   LORazepam (ATIVAN) tablet 1 mg   loperamide (IMODIUM) capsule 2-4 mg   ondansetron (ZOFRAN-ODT) disintegrating tablet 4 mg   OLANZapine zydis (ZYPREXA) disintegrating tablet 5 mg   OLANZapine (ZYPREXA) injection 5 mg   [COMPLETED] LORazepam (ATIVAN) tablet 1 mg   Followed by   LORazepam (ATIVAN) tablet 1 mg   Followed by   Cecily Cohen ON 10/08/2023] LORazepam (ATIVAN) tablet 1 mg   Followed by   Cecily Cohen ON 10/09/2023] LORazepam (ATIVAN) tablet 1 mg   albuterol (VENTOLIN HFA) 108 (90 Base) MCG/ACT inhaler 2 puff   irbesartan (AVAPRO) tablet 150 mg   aspirin EC tablet 81 mg   atorvastatin (LIPITOR) tablet  40 mg   metFORMIN (GLUCOPHAGE) tablet 500 mg   metoprolol succinate (TOPROL-XL) 24 hr tablet 50 mg   Vitamin D (Ergocalciferol) (DRISDOL) 1.25 MG (50000 UNIT) capsule 50,000 Units   escitalopram (LEXAPRO) tablet 10 mg   nicotine (NICODERM CQ - dosed in mg/24 hours) patch 14 mg   hydrOXYzine (ATARAX) tablet 25 mg   melatonin tablet 5 mg   PTA Medications  Medication Sig   FLUoxetine (PROZAC) 40 MG capsule Take 40 mg by mouth daily.   clonazePAM (KLONOPIN) 0.5 MG tablet Take 0.5 mg by mouth 3 (three) times daily as needed for anxiety.   aspirin EC 81 MG tablet Take 81 mg by mouth daily.   zolpidem (AMBIEN) 10 MG tablet Take 10 mg by mouth at bedtime as needed for sleep.   atorvastatin (LIPITOR) 40 MG tablet Take 40 mg by mouth daily.   albuterol (PROVENTIL HFA;VENTOLIN HFA)  108 (90 Base) MCG/ACT inhaler Inhale 2 puffs into the lungs every 6 (six) hours as needed for wheezing or shortness of breath.   tiotropium (SPIRIVA HANDIHALER) 18 MCG inhalation capsule Place 1 capsule (18 mcg total) into inhaler and inhale daily.   telmisartan (MICARDIS) 40 MG tablet Take 40 mg by mouth daily.   cyclobenzaprine (FLEXERIL) 10 MG tablet Take 10 mg by mouth 3 (three) times daily as needed for muscle spasms.   DULoxetine (CYMBALTA) 30 MG capsule Take 30 mg by mouth daily.   metFORMIN (GLUCOPHAGE) 500 MG tablet Take 500 mg by mouth 2 (two) times daily.   metoprolol succinate (TOPROL-XL) 50 MG 24 hr tablet Take 1 tablet by mouth every night. Take with or immediately following a meal.   Magnesium Oxide 400 MG CAPS Take 1 capsule (400 mg total) by mouth every Monday, Wednesday, and Friday. Take a half tablet (200 mg) every Saturday, Sunday, Tuesday, Thursday.   Vitamin D, Ergocalciferol, (DRISDOL) 1.25 MG (50000 UNIT) CAPS capsule Take 50,000 Units by mouth once a week.    Long Term Goals: Improvement in symptoms so as ready for discharge  Short Term Goals: Patient will verbalize feelings in meetings with  treatment team members., Patient will attend at least of 50% of the groups daily., Pt will complete the PHQ9 on admission, day 3 and discharge., Patient will participate in completing the [place] Suicide Severity Rating Scale, Patient will score a low risk of violence for 24 hours prior to discharge, and Patient will take medications as prescribed daily.  Medical Decision Making  Inpatient FBC Meds ordered this encounter  Medications   acetaminophen (TYLENOL) tablet 650 mg   alum & mag hydroxide-simeth (MAALOX/MYLANTA) 200-200-20 MG/5ML suspension 30 mL   magnesium hydroxide (MILK OF MAGNESIA) suspension 30 mL   thiamine (VITAMIN B1) injection 100 mg   thiamine (VITAMIN B1) tablet 100 mg   multivitamin with minerals tablet 1 tablet   LORazepam (ATIVAN) tablet 1 mg   DISCONTD: hydrOXYzine (ATARAX) tablet 25 mg   loperamide (IMODIUM) capsule 2-4 mg   ondansetron (ZOFRAN-ODT) disintegrating tablet 4 mg   OLANZapine zydis (ZYPREXA) disintegrating tablet 5 mg   OLANZapine (ZYPREXA) injection 5 mg   FOLLOWED BY Linked Order Group    LORazepam (ATIVAN) tablet 1 mg    LORazepam (ATIVAN) tablet 1 mg    LORazepam (ATIVAN) tablet 1 mg    LORazepam (ATIVAN) tablet 1 mg   albuterol (VENTOLIN HFA) 108 (90 Base) MCG/ACT inhaler 2 puff   irbesartan (AVAPRO) tablet 150 mg   aspirin EC tablet 81 mg   atorvastatin (LIPITOR) tablet 40 mg   metFORMIN (GLUCOPHAGE) tablet 500 mg   metoprolol succinate (TOPROL-XL) 24 hr tablet 50 mg   Vitamin D (Ergocalciferol) (DRISDOL) 1.25 MG (50000 UNIT) capsule 50,000 Units   escitalopram (LEXAPRO) tablet 10 mg   nicotine (NICODERM CQ - dosed in mg/24 hours) patch 14 mg   hydrOXYzine (ATARAX) tablet 25 mg   melatonin tablet 5 mg       Recommendations  Based on my evaluation the patient does not appear to have an emergency medical condition.  Dorthea Gauze, NP 10/07/23  5:36 AM

## 2023-10-06 NOTE — ED Notes (Addendum)
 Patient in the bedroom composed and sleeping with eyes closed. Denies SI, HI/AVH. respirations even and unlabored. Environment secured per policy. Will monitor for safety.

## 2023-10-06 NOTE — ED Notes (Incomplete)
 Patient presents to the [facility], direct admit from [facility] requesting help with alcohol abuse and depression. Patient states that she began drinking heavy after the death of her son who died from cirrhosis. Reports that she has had problems with depression since the death of her son. Patient states that she is on medications for depression. . Patient states that she does have withdrawal when she does not drink. Patient states she is shaky when she gets up in the morning until she has her first drink. Patient reports that her primary care doctor is treating her for anxiety and depression. Patient states that she is taking Klonopin twice a day for anxiety, BuSpar for anxiety. Patient is taking Cymbalta and Prozac for depression. Patient reports her primary care doctor has told her that her liver functions have been elevated in the past and that she has had protein in her urine in the past. Pt reports she has also had frequent falls.

## 2023-10-06 NOTE — ED Provider Notes (Signed)
 Behavioral Health Progress Note  Date and Time: 10/06/2023 12:36 PM Name: Molly [name] MRN:  [id number]  Subjective:  Molly [name], 68 y/o female with history of alcohol abuse, presented to [facility] for detox and medication optimization.  Patient seen in the milieu, no acute distress. Patient reports feeling "anxious" today. Patient reports good sleep and good appetite. Regarding withdrawal symptoms, she denies. She reports on average drinking 2 cocktails daily. She denies a history of withdrawal seizures or Dts. Patient denies current SI, HI, and AVH. Regarding discharge plans, she would like to detox from alcohol and return home with either OP follow up with a psychiatrist/therapist or CDIOP. I discussed changing her medication for depression and anxiety as she is currently on both Prozac and Cymbalta and she is agreeable.   Diagnosis:  Final diagnoses:  Alcohol abuse  Anxious appearance    Total Time spent with patient: 30 minutes  Past Psychiatric History: Dx: MDD, GAD Rx: Klonopin (confirmed on PDMP), Prozac, Cymbalta Dr: Dr. Lewie Chamber, denies seeing a therapist Denies previous psychiatric hospitalizations  Substance use: Alcohol: started drinking at 68 years old and increased amount and frequency when son passed away in 2015-11-09- now drinking 2 cocktails daily, most recently drink on 4/14; denies withdrawal symptom history of seizures and Dts Tobacco: Smokes 0.5 pack daily since teens  Past Medical History: HTN  Family History: none reported  Social History: lives with ex husband, daughter, and 3 grand daughters, retired, denies upcoming legal issues, denies access to weapons  Current Medications:  Current Facility-Administered Medications  Medication Dose Route Frequency Provider Last Rate Last Admin   acetaminophen (TYLENOL) tablet 650 mg  650 mg Oral Q6H PRN Sindy Guadeloupe, NP   650 mg at 10/06/23 0933   albuterol (VENTOLIN HFA) 108 (90 Base) MCG/ACT inhaler 2 puff  2 puff  Inhalation Q6H PRN Onuoha, Chinwendu V, NP   2 puff at 10/06/23 0633   alum & mag hydroxide-simeth (MAALOX/MYLANTA) 200-200-20 MG/5ML suspension 30 mL  30 mL Oral Q4H PRN Sindy Guadeloupe, NP       aspirin EC tablet 81 mg  81 mg Oral Daily Kizzie Ide B, MD       atorvastatin (LIPITOR) tablet 40 mg  40 mg Oral Daily Kizzie Ide B, MD       hydrOXYzine (ATARAX) tablet 25 mg  25 mg Oral Q6H PRN Sindy Guadeloupe, NP       irbesartan (AVAPRO) tablet 150 mg  150 mg Oral Daily Onuoha, Chinwendu V, NP   150 mg at 10/06/23 5621   loperamide (IMODIUM) capsule 2-4 mg  2-4 mg Oral PRN Sindy Guadeloupe, NP       LORazepam (ATIVAN) tablet 1 mg  1 mg Oral Q6H PRN Sindy Guadeloupe, NP       LORazepam (ATIVAN) tablet 1 mg  1 mg Oral QID Sindy Guadeloupe, NP   1 mg at 10/06/23 3086   Followed by   Melene Muller ON 10/07/2023] LORazepam (ATIVAN) tablet 1 mg  1 mg Oral TID Sindy Guadeloupe, NP       Followed by   Melene Muller ON 10/08/2023] LORazepam (ATIVAN) tablet 1 mg  1 mg Oral BID Sindy Guadeloupe, NP       Followed by   Melene Muller ON 10/09/2023] LORazepam (ATIVAN) tablet 1 mg  1 mg Oral Daily Sindy Guadeloupe, NP       magnesium hydroxide (MILK OF MAGNESIA) suspension 30 mL  30 mL Oral Daily PRN Sindy Guadeloupe, NP  metFORMIN (GLUCOPHAGE) tablet 500 mg  500 mg Oral BID WC Tammela Bales B, MD       metoprolol succinate (TOPROL-XL) 24 hr tablet 50 mg  50 mg Oral Daily Virgil Slinger B, MD       multivitamin with minerals tablet 1 tablet  1 tablet Oral Daily Dorthea Gauze, NP   1 tablet at 10/06/23 0933   OLANZapine (ZYPREXA) injection 5 mg  5 mg Intramuscular TID PRN Dorthea Gauze, NP       OLANZapine zydis (ZYPREXA) disintegrating tablet 5 mg  5 mg Oral TID PRN Dorthea Gauze, NP       ondansetron (ZOFRAN-ODT) disintegrating tablet 4 mg  4 mg Oral Q6H PRN Dorthea Gauze, NP       thiamine (VITAMIN B1) injection 100 mg  100 mg Intramuscular Once Dorthea Gauze, NP       Cecily Cohen ON 10/07/2023] thiamine (VITAMIN B1) tablet 100 mg  100 mg Oral Daily  Dorthea Gauze, NP       Vitamin D (Ergocalciferol) (DRISDOL) 1.25 MG (50000 UNIT) capsule 50,000 Units  50,000 Units Oral Weekly Alfa Leibensperger B, MD       Current Outpatient Medications  Medication Sig Dispense Refill   albuterol (PROVENTIL HFA;VENTOLIN HFA) 108 (90 Base) MCG/ACT inhaler Inhale 2 puffs into the lungs every 6 (six) hours as needed for wheezing or shortness of breath. 1 Inhaler 2   aspirin EC 81 MG tablet Take 81 mg by mouth daily.     atorvastatin (LIPITOR) 40 MG tablet Take 40 mg by mouth daily.     clonazePAM (KLONOPIN) 0.5 MG tablet Take 0.5 mg by mouth 3 (three) times daily as needed for anxiety.     cyclobenzaprine (FLEXERIL) 10 MG tablet Take 10 mg by mouth 3 (three) times daily as needed for muscle spasms.     DULoxetine (CYMBALTA) 30 MG capsule Take 30 mg by mouth daily.     FLUoxetine (PROZAC) 40 MG capsule Take 40 mg by mouth daily.     Magnesium Oxide 400 MG CAPS Take 1 capsule (400 mg total) by mouth every Monday, Wednesday, and Friday. Take a half tablet (200 mg) every Saturday, Sunday, Tuesday, Thursday. 60 capsule 2   metFORMIN (GLUCOPHAGE) 500 MG tablet Take 500 mg by mouth 2 (two) times daily.     metoprolol succinate (TOPROL-XL) 50 MG 24 hr tablet Take 1 tablet by mouth every night. Take with or immediately following a meal. 90 tablet 3   telmisartan (MICARDIS) 40 MG tablet Take 40 mg by mouth daily.  12   tiotropium (SPIRIVA HANDIHALER) 18 MCG inhalation capsule Place 1 capsule (18 mcg total) into inhaler and inhale daily. 30 capsule 2   Vitamin D, Ergocalciferol, (DRISDOL) 1.25 MG (50000 UNIT) CAPS capsule Take 50,000 Units by mouth once a week.     zolpidem (AMBIEN) 10 MG tablet Take 10 mg by mouth at bedtime as needed for sleep.      Labs  Lab Results:  Admission on 10/05/2023, Discharged on 10/06/2023  Component Date Value Ref Range Status   Sodium 10/05/2023 139  135 - 145 mmol/L Final   Potassium 10/05/2023 5.0  3.5 - 5.1 mmol/L Final   Chloride  10/05/2023 106  98 - 111 mmol/L Final   CO2 10/05/2023 22  22 - 32 mmol/L Final   Glucose, Bld 10/05/2023 125 (H)  70 - 99 mg/dL Final   Glucose reference range applies only to samples taken after fasting for at least 8 hours.  BUN 10/05/2023 13  8 - 23 mg/dL Final   Creatinine, Ser 10/05/2023 0.53  0.44 - 1.00 mg/dL Final   Calcium 16/03/9603 9.1  8.9 - 10.3 mg/dL Final   Total Protein 54/02/8118 6.1 (L)  6.5 - 8.1 g/dL Final   Albumin 14/78/2956 3.3 (L)  3.5 - 5.0 g/dL Final   AST 21/30/8657 29  15 - 41 U/L Final   ALT 10/05/2023 38  0 - 44 U/L Final   Alkaline Phosphatase 10/05/2023 106  38 - 126 U/L Final   Total Bilirubin 10/05/2023 0.4  0.0 - 1.2 mg/dL Final   GFR, Estimated 10/05/2023 >60  >60 mL/min Final   Comment: (NOTE) Calculated using the CKD-EPI Creatinine Equation (2021)    Anion gap 10/05/2023 11  5 - 15 Final   Performed at [facility], [address], [place], [place] [address]   Alcohol, Ethyl (B) 10/05/2023 21 (H)  <10 mg/dL Final   Comment: (NOTE) Lowest detectable limit for serum alcohol is 10 mg/dL.  For medical purposes only. Performed at [facility], [address], [place], [place] [address]    Opiates 10/05/2023 NONE DETECTED  NONE DETECTED Final   Cocaine 10/05/2023 NONE DETECTED  NONE DETECTED Final   Benzodiazepines 10/05/2023 NONE DETECTED  NONE DETECTED Final   Amphetamines 10/05/2023 NONE DETECTED  NONE DETECTED Final   Tetrahydrocannabinol 10/05/2023 NONE DETECTED  NONE DETECTED Final   Barbiturates 10/05/2023 NONE DETECTED  NONE DETECTED Final   Comment: (NOTE) DRUG SCREEN FOR MEDICAL PURPOSES ONLY.  IF CONFIRMATION IS NEEDED FOR ANY PURPOSE, NOTIFY LAB WITHIN 5 DAYS.  LOWEST DETECTABLE LIMITS FOR URINE DRUG SCREEN Drug Class                     Cutoff (ng/mL) Amphetamine and metabolites    1000 Barbiturate and metabolites    200 Benzodiazepine                 200 Opiates and metabolites        300 Cocaine and metabolites         300 THC                            50 Performed at [facility], [address], [place], [place] [address]    Lipase 10/05/2023 24  11 - 51 U/L Final   Performed at [facility], [address] [place], [place] [address]   WBC 10/05/2023 8.2  4.0 - 10.5 K/uL Final   RBC 10/05/2023 4.40  3.87 - 5.11 MIL/uL Final   Hemoglobin 10/05/2023 14.2  12.0 - 15.0 g/dL Final   HCT 72/53/6644 43.0  36.0 - 46.0 % Final   MCV 10/05/2023 97.7  80.0 - 100.0 fL Final   MCH 10/05/2023 32.3  26.0 - 34.0 pg Final   MCHC 10/05/2023 33.0  30.0 - 36.0 g/dL Final   RDW 03/47/4259 14.6  11.5 - 15.5 % Final   Platelets 10/05/2023 153  150 - 400 K/uL Final   Comment: REPEATED TO VERIFY PLATELET COUNT CONFIRMED BY SMEAR    nRBC 10/05/2023 0.0  0.0 - 0.2 % Final   Neutrophils Relative % 10/05/2023 64  % Final   Neutro Abs 10/05/2023 5.3  1.7 - 7.7 K/uL Final   Lymphocytes Relative 10/05/2023 25  % Final   Lymphs Abs 10/05/2023 2.0  0.7 - 4.0 K/uL Final  Monocytes Relative 10/05/2023 5  % Final   Monocytes Absolute 10/05/2023 0.4  0.1 - 1.0 K/uL Final   Eosinophils Relative 10/05/2023 5  % Final   Eosinophils Absolute 10/05/2023 0.4  0.0 - 0.5 K/uL Final   Basophils Relative 10/05/2023 1  % Final   Basophils Absolute 10/05/2023 0.1  0.0 - 0.1 K/uL Final   Immature Granulocytes 10/05/2023 0  % Final   Abs Immature Granulocytes 10/05/2023 0.02  0.00 - 0.07 K/uL Final   Performed at [facility], [address], [place], [place] [address]   Ammonia 10/05/2023 30  9 - 35 umol/L Final   Comment: HEMOLYSIS AT THIS LEVEL MAY AFFECT RESULT Performed at [facility], [address] [place], [place] [address]     Blood Alcohol level:  Lab Results  Component Value Date   ETH 21 (H) 10/05/2023    Metabolic Disorder Labs: Lab Results  Component Value Date   HGBA1C 6.4 (H) 02/16/2018   No results found for: "PROLACTIN" No results found for: "CHOL", "TRIG", "HDL", "CHOLHDL",  "VLDL", "LDLCALC"  Therapeutic Lab Levels: No results found for: "LITHIUM" No results found for: "VALPROATE" No results found for: "CBMZ"  Physical Findings   PHQ2-9    Flowsheet Row ED from 10/06/2023 in [facility]  PHQ-2 Total Score 4  PHQ-9 Total Score 10      Flowsheet Row ED from 10/06/2023 in [facility] ED from 10/05/2023 in [facility] Emergency Department at [facility]  C-SSRS RISK CATEGORY No Risk No Risk        Musculoskeletal  Strength & Muscle Tone: within normal limits Gait & Station: ataxic Patient leans: N/A  Psychiatric Specialty Exam  Presentation  General Appearance:  Appropriate for Environment  Eye Contact: Good  Speech: Clear and Coherent  Speech Volume: Normal  Handedness: Right   Mood and Affect  Mood: Anxious  Affect: Appropriate   Thought Process  Thought Processes: Coherent  Descriptions of Associations:Intact  Orientation:Full (Time, Place and Person)  Thought Content:Logical     Hallucinations:Hallucinations: None  Ideas of Reference:None  Suicidal Thoughts:Suicidal Thoughts: No  Homicidal Thoughts:Homicidal Thoughts: No   Sensorium  Memory: Immediate Good  Judgment: Fair  Insight: Fair   Art therapist  Concentration: Fair  Attention Span: Good  Recall: Good  Fund of Knowledge: Good  Language: Good   Psychomotor Activity  Psychomotor Activity: Psychomotor Activity: Normal   Assets  Assets: Desire for Improvement   Sleep  Sleep: Sleep: Fair Number of Hours of Sleep: 6   Nutritional Assessment (For OBS and FBC admissions only) Has the patient had a weight loss or gain of 10 pounds or more in the last 3 months?: No Has the patient had a decrease in food intake/or appetite?: No Does the patient have dental problems?: No Does the patient have eating habits or behaviors that may be indicators of an eating  disorder including binging or inducing vomiting?: No Has the patient recently lost weight without trying?: 0 Has the patient been eating poorly because of a decreased appetite?: 0 Malnutrition Screening Tool Score: 0    Physical Exam  Physical Exam Vitals reviewed.  Constitutional:      Appearance: Normal appearance.  HENT:     Head: Normocephalic and atraumatic.  Cardiovascular:     Rate and Rhythm: Normal rate.  Pulmonary:     Effort: Pulmonary effort is normal.  Neurological:     General: No  focal deficit present.     Mental Status: She is alert and oriented to person, place, and time.    Review of Systems  Constitutional:  Negative for chills and fever.  Respiratory:  Negative for shortness of breath.   Cardiovascular:  Negative for chest pain and palpitations.  Gastrointestinal:  Negative for nausea and vomiting.  Neurological:  Negative for headaches.   Blood pressure (!) 159/99, pulse 83, temperature 98.3 F (36.8 C), temperature source Oral, resp. rate 19, SpO2 96%. There is no height or weight on file to calculate BMI.  Treatment Plan Summary: Alcohol Use Disorder EtOH level: 21; LFTs: WNL; last use date: 4/14 -Ativan taper -CIWA with Ativan as needed for CIWA greater than 10  -Last CIWA score is 1 on 4/16 at 3AM -Thiamine 100 mg IM first day and PO after that -Multivitamin with minerals daily -Tylenol 650 mg every 6 hours as needed for pain -Zofran 4 mg every 6 hours as needed for nausea or vomiting -Imodium 2 to 4 mg as needed for diarrhea or loose stools  -Maalox/Mylanta 30 mL every 4 hours as needed for indigestion -Milk of Mag 30 mL as needed for constipation  MDD, GAD -Start Lexapro 10 mg for depression and anxiety -Continue home vitamin D weekly -D/c home Prozac and Cymbalta -Hold home Klonopin (on Ativan taper) PRN medications: -Hydroxyzine 25 mg TID PRN for anxiety -Melatonin 5 mg at bedtime prn for insomnia  Tobacco Use Disorder -NRT  ordered -Encourage smoking cessation   Medical conditions: continue home aspirin, irbesartan, lipitor, metformin, metaprolol  Dispo: home with either OP resources or CDIOP  Joice Nares, MD 10/06/2023 12:36 PM

## 2023-10-06 NOTE — ED Provider Notes (Incomplete)
 [facility] EMERGENCY DEPARTMENT AT [facility] Provider Note   CSN: [id number] Arrival date & time: 10/05/23  1155     History  Chief Complaint  Patient presents with  . Alcohol Problem    Pt's PCP sent pt due to elevated liver enzymes.     Molly Molly is a 68 y.o. female.  Patient presents to the emergency department requesting help with alcohol abuse and depression.  Patient reports that Molly Molly told Molly that she is drinking 1/2 gallon of vodka every 2 days.  Patient states she did not realize that she was drinking this much.  Patient states that she began drinking heavy after the death of Molly Molly.  Molly Molly died from cirrhosis.  Patient reports that she has had problems with depression since the death of Molly Molly.  Patient states that she is on medications for depression.  Patient is motivated to come to the emergency department today for help with depression and alcohol abuse by Molly Molly who has asked Molly to get help.  Patient states that she does have withdrawal when she does not drink.  Patient states she is shaky when she gets up in the morning until she has Molly first drink.  Patient reports that Molly primary care doctor is treating Molly for anxiety and depression.  Patient states that she is taking Klonopin twice a day for anxiety, BuSpar for anxiety.  Patient is taking Cymbalta and Prozac for depression.  Patient reports Molly primary care doctor has told Molly that Molly liver functions have been elevated in the past and that she has had protein in Molly urine in the past. Pt reports she has also had frequent falls. Pt reports she frequently feels off balance  The history is provided by the patient. No language interpreter was used.  Alcohol Problem       Home Medications Prior to Admission medications   Medication Sig Start Date End Date Taking? Authorizing Provider  albuterol (PROVENTIL HFA;VENTOLIN HFA) 108 (90 Base) MCG/ACT inhaler Inhale 2 puffs into  the lungs every 6 (six) hours as needed for wheezing or shortness of breath. 05/15/17   Vassie Loll, MD  aspirin EC 81 MG tablet Take 81 mg by mouth daily.    [provider]  atorvastatin (LIPITOR) 40 MG tablet Take 40 mg by mouth daily.    [provider]  busPIRone (BUSPAR) 15 MG tablet Take 15 mg by mouth 2 (two) times daily.    [provider]  clonazePAM (KLONOPIN) 0.5 MG tablet Take 0.5 mg by mouth 2 (two) times daily as needed for anxiety.    [provider]  cyclobenzaprine (FLEXERIL) 10 MG tablet Take 10 mg by mouth 3 (three) times daily as needed for muscle spasms.    [provider]  DULoxetine (CYMBALTA) 30 MG capsule Take 30 mg by mouth daily. 02/21/23   [provider]  FLUoxetine (PROZAC) 20 MG capsule Take 60 mg by mouth daily.    [provider]  Magnesium Oxide 400 MG CAPS Take 1 capsule (400 mg total) by mouth every Monday, Wednesday, and Friday. Take a half tablet (200 mg) every Saturday, Sunday, Tuesday, Thursday. 06/30/23   Quintella Reichert, MD  meloxicam (MOBIC) 15 MG tablet Take 15 mg by mouth daily as needed. 03/10/23   [provider]  metFORMIN (GLUCOPHAGE) 500 MG tablet Take 500 mg by mouth 2 (two) times daily. 02/26/23   [provider]  metoprolol succinate (TOPROL-XL)  50 MG 24 hr tablet Take 1 tablet by mouth every night. Take with or immediately following a meal. 03/22/23   Von Grumbling, PA-C  pantoprazole (PROTONIX) 40 MG tablet Take 40 mg by mouth daily as needed. 01/25/23   [provider]  telmisartan (MICARDIS) 40 MG tablet Take 40 mg by mouth daily. 12/07/17   [provider]  tiotropium (SPIRIVA HANDIHALER) 18 MCG inhalation capsule Place 1 capsule (18 mcg total) into inhaler and inhale daily. 05/15/17 08/01/21  Justina Oman, MD  tobramycin (TOBREX) 0.3 % ophthalmic solution Place into both eyes every 4 (four) hours. 02/25/23   [provider]  zolpidem (AMBIEN)  10 MG tablet Take 10 mg by mouth at bedtime as needed for sleep.    [provider]      Allergies    Penicillins    Review of Systems   Review of Systems  All other systems reviewed and are negative.   Physical Exam Updated Vital Signs BP (!) 148/95 (BP Location: Left Arm)   Pulse 84   Temp 98.4 F (36.9 C) (Oral)   Resp 19   Ht 5\' 3"  (1.6 m)   Wt 63.5 kg   SpO2 95%   BMI 24.80 kg/m  Physical Exam Vitals and nursing note reviewed.  Constitutional:      Appearance: She is well-developed.  HENT:     Head: Normocephalic.     Right Ear: Tympanic membrane normal.     Left Ear: Tympanic membrane normal.     Mouth/Throat:     Mouth: Mucous membranes are moist.  Cardiovascular:     Rate and Rhythm: Normal rate.  Pulmonary:     Effort: Pulmonary effort is normal.  Abdominal:     General: There is no distension.  Musculoskeletal:        General: Normal range of motion.     Cervical back: Normal range of motion.  Skin:    General: Skin is warm.  Neurological:     General: No focal deficit present.     Mental Status: She is alert and oriented to person, place, and time.  Psychiatric:        Mood and Affect: Mood normal.     ED Results / Procedures / Treatments   Labs (all labs ordered are listed, but only abnormal results are displayed) Labs Reviewed  COMPREHENSIVE METABOLIC PANEL WITH GFR - Abnormal; Notable for the following components:      Result Value   Glucose, Bld 125 (*)    Total Protein 6.1 (*)    Albumin 3.3 (*)    All other components within normal limits  ETHANOL - Abnormal; Notable for the following components:   Alcohol, Ethyl (B) 21 (*)    All other components within normal limits  LIPASE, BLOOD  CBC WITH DIFFERENTIAL/PLATELET  AMMONIA  RAPID URINE DRUG SCREEN, HOSP PERFORMED    EKG None  Radiology CT Head Wo Contrast Result Date: 10/05/2023 CLINICAL DATA:  Alcohol dependency.  Head trauma, minor. EXAM: CT HEAD WITHOUT CONTRAST  TECHNIQUE: Contiguous axial images were obtained from the base of the skull through the vertex without intravenous contrast. RADIATION DOSE REDUCTION: This exam was performed according to the departmental dose-optimization program which includes automated exposure control, adjustment of the mA and/or kV according to patient size and/or use of iterative reconstruction technique. COMPARISON:  05/27/2009 FINDINGS: Brain: Generalized brain atrophy without subjective lobar predominance. Chronic small-vessel ischemic changes of the cerebral hemispheric white matter. No large  vessel territory stroke. No mass, hemorrhage, hydrocephalus or extra-axial collection. Vascular: There is atherosclerotic calcification of the major vessels at the base of the brain. Skull: Negative Sinuses/Orbits: Clear/normal Other: None IMPRESSION: No acute CT finding. Generalized brain atrophy. Chronic small-vessel ischemic changes of the cerebral hemispheric white matter. Electronically Signed   By: Bettylou Brunner M.D.   On: 10/05/2023 14:33    Procedures Procedures    Medications Ordered in ED Medications - No data to display  ED Course/ Medical Decision Making/ A&P                                 Medical Decision Making Patient is requesting treatment for alcohol abuse and depression.  Amount and/or Complexity of Data Reviewed Labs: ordered. Decision-making details documented in ED Course.    Details: Labs ordered reviewed and interpreted Etoh is 21 Radiology: ordered and independent interpretation performed. Decision-making details documented in ED Course.    Details: Ct head  no acute abnormality   Risk OTC drugs. Prescription drug management. Risk Details: TTS consulted. Transitions of care consulted         TTS evaluated and advised inpatient treatment.  Pt accepted at [facility] for inpatient treatment  Final Clinical Impression(s) / ED Diagnoses Final diagnoses:  Alcohol abuse  Depression, unspecified  depression type    Rx / DC Orders ED Discharge Orders     None         Sandi Crosby, PA-C 10/05/23 1812    Sandi Crosby, PA-C 10/05/23 1847    Sandi Crosby, PA-C 10/05/23 2125

## 2023-10-06 NOTE — Group Note (Signed)
 Group Topic: Communication  Group Date: 10/06/2023 Start Time: 1930 End Time: 2000 Facilitators: Wendall Halls B  Department: [facility]  Number of Participants: 5  Group Focus: abuse issues, activities of daily living skills, anger management, check in, communication, coping skills, and daily focus Treatment Modality:  Individual Therapy Interventions utilized were leisure development Purpose: express feelings, express irrational fears, increase insight, and reinforce self-care  Name: Molly [name] Date of Birth: [birth year]  MR: [id number]    Level of Participation: PT DID NOT ATTEND GROUPS  Quality of Participation: cooperative Interactions with others: gave feedback Mood/Affect: appropriate Triggers (if applicable): NA Cognition: coherent/clear Progress: None Response: NA Plan: patient will be encouraged to keep going to groups.   Patients Problems:  Patient Active Problem List   Diagnosis Date Noted   Alcohol abuse 10/06/2023   Gastroesophageal reflux disease    COPD mixed type (HCC)    Hypertension 05/14/2017   Depression with anxiety 05/14/2017   Multifocal pneumonia 05/14/2017   Current smoker 05/14/2017

## 2023-10-06 NOTE — Group Note (Signed)
 Group Topic: Social Support  Group Date: 10/06/2023 Start Time: 1630 End Time: 1705 Facilitators: Truett Gab, Milissa Alley  Department: [facility]  Number of Participants: 5  Group Focus: check in Treatment Modality:  Psychoeducation Interventions utilized were patient education Purpose: increase insight  Name: Molly [name] Date of Birth: Oct 24, 1955  MR: [id number]    Level of Participation: Patient did not attend group Quality of Participation: Patient did not attend group Interactions with others: N/A Mood/Affect: N/A Triggers (if applicable): N/A Cognition: N/A Progress: None Response: N/A Plan: follow-up needed  Patients Problems:  Patient Active Problem List   Diagnosis Date Noted   Alcohol abuse 10/06/2023   Gastroesophageal reflux disease    COPD mixed type (HCC)    Hypertension 05/14/2017   Depression with anxiety 05/14/2017   Multifocal pneumonia 05/14/2017   Current smoker 05/14/2017

## 2023-10-06 NOTE — ED Notes (Signed)
 Pt BP is 142/ 80. O2 is 92. NP Chiwendu notified. Medication orders administered.

## 2023-10-06 NOTE — ED Notes (Signed)
Patient was given dinner. 

## 2023-10-06 NOTE — ED Notes (Signed)
 Patient alert & oriented x4. Denies intent to harm self or others when asked. Denies A/VH. Patient denies any physical complaints when asked. No acute distress noted. Scheduled medications administered with no complications. Support and encouragement provided. Routine safety checks conducted per facility protocol. Encouraged patient to notify staff if any thoughts of harm towards self or others arise. Patient verbalizes understanding and agreement.

## 2023-10-06 NOTE — ED Notes (Signed)
 Patient in hallway, calm and composed. No acute distress noted. No concerns voiced. Informed patient to notify staff with any needs or assistance. Patient verbalized understanding or agreement. Safety checks in place per facility policy.

## 2023-10-07 DIAGNOSIS — F1014 Alcohol abuse with alcohol-induced mood disorder: Secondary | ICD-10-CM | POA: Diagnosis not present

## 2023-10-07 DIAGNOSIS — F1010 Alcohol abuse, uncomplicated: Secondary | ICD-10-CM | POA: Diagnosis not present

## 2023-10-07 MED ORDER — NICOTINE 14 MG/24HR TD PT24: 14.0000 mg | MEDICATED_PATCH | Freq: Every day | TRANSDERMAL | 0 refills | Status: AC

## 2023-10-07 MED ORDER — ESCITALOPRAM OXALATE 10 MG PO TABS: 10.0000 mg | ORAL_TABLET | Freq: Every day | ORAL | 0 refills | Status: AC

## 2023-10-07 NOTE — Group Note (Signed)
 Group Topic: Communication  Group Date: 10/07/2023 Start Time: 0940 End Time: 1005 Facilitators: Denzil Flatten, RN  Department: [facility]  Number of Participants: 3  Group Focus: discharge education Treatment Modality:  Leisure Development Interventions utilized were patient education Purpose: relapse prevention strategies  Name: Molly [name] Date of Birth: September 13, 1955  MR: [id number]    Level of Participation: active Quality of Participation: cooperative Interactions with others: gave feedback Mood/Affect: appropriate Triggers (if applicable): none identified Cognition: coherent/clear Progress: Significant Response: pt verbalized understanding of all discharge instructions reviewed on AVS Plan: patient will be encouraged to continue road to recovery after discharge and utilized learned coping skills  Patients Problems:  Patient Active Problem List   Diagnosis Date Noted   Alcohol abuse 10/06/2023   Gastroesophageal reflux disease    COPD mixed type (HCC)    Hypertension 05/14/2017   Depression with anxiety 05/14/2017   Multifocal pneumonia 05/14/2017   Current smoker 05/14/2017

## 2023-10-07 NOTE — ED Provider Notes (Signed)
 FBC/OBS ASAP Discharge Summary  Date and Time: 10/07/2023 9:47 AM  Name: Molly [name]  MRN:  [id number]   Discharge Diagnoses:  Final diagnoses:  Alcohol abuse  Anxious appearance   Socorro Buttler, 68 y/o female with history of alcohol abuse, presented to [facility] for detox and medication optimization.   Subjective:   Patient was seen on the unit, no acute distress.  Denied active and passive SI, HI, AVH, paranoia.   Confirmed with patient's daughter Wende Crease that patient is safe to discharge home. She confirms there are no illicit substances or weapons in the home.   Review of Systems  Constitutional:  Negative for chills and fever.  Respiratory:  Negative for shortness of breath.   Cardiovascular:  Negative for chest pain and palpitations.  Gastrointestinal:  Negative for nausea and vomiting.  Neurological:  Negative for headaches.     Stay Summary:  Admission date: 10/05/2023 Discharge date: 10/07/2023    The patient was evaluated each day by a clinical provider to ascertain response to treatment. Improvement was noted by the patient's report of decreasing symptoms, improved sleep and appetite, affect, medication tolerance, behavior, and participation in unit programming.  Patient was asked each day to complete a self inventory noting mood, mental status, pain, new symptoms, anxiety and concerns.  The patient's medications were managed with the following directions: -started Ativan Taper -started Lexapro 10 mg daily  -d/ced home Prozac and Cymbalta -held home Klonopin (on Ativan taper)  Patient responded well to medication and being in a therapeutic and supportive environment. Positive and appropriate behavior was noted and the patient was motivated for recovery. The patient worked closely with the treatment team and case manager to develop a discharge plan with appropriate goals. Coping skills, problem solving as well as relaxation therapies were also part of the unit  programming. Patient has denied SI and HI for over 48 hours.    Total Time spent with patient: 1 hour  Past Psychiatric History: see H&P Past Medical History: see H&P Family History: see H&P Family Psychiatric History: see H&P Social History: see H&P Tobacco Cessation:  A prescription for an FDA-approved tobacco cessation medication provided at discharge  Current Medications:  Current Facility-Administered Medications  Medication Dose Route Frequency Provider Last Rate Last Admin   acetaminophen (TYLENOL) tablet 650 mg  650 mg Oral Q6H PRN Sindy Guadeloupe, NP   650 mg at 10/06/23 0933   albuterol (VENTOLIN HFA) 108 (90 Base) MCG/ACT inhaler 2 puff  2 puff Inhalation Q6H PRN Onuoha, Chinwendu V, NP   2 puff at 10/06/23 0633   alum & mag hydroxide-simeth (MAALOX/MYLANTA) 200-200-20 MG/5ML suspension 30 mL  30 mL Oral Q4H PRN Sindy Guadeloupe, NP       aspirin EC tablet 81 mg  81 mg Oral Daily Kizzie Ide B, MD   81 mg at 10/06/23 1419   atorvastatin (LIPITOR) tablet 40 mg  40 mg Oral Daily Kizzie Ide B, MD   40 mg at 10/06/23 1420   escitalopram (LEXAPRO) tablet 10 mg  10 mg Oral Daily Kizzie Ide B, MD   10 mg at 10/06/23 1420   hydrOXYzine (ATARAX) tablet 25 mg  25 mg Oral Q6H PRN Kizzie Ide B, MD       irbesartan (AVAPRO) tablet 150 mg  150 mg Oral Daily Onuoha, Chinwendu V, NP   150 mg at 10/06/23 4098   loperamide (IMODIUM) capsule 2-4 mg  2-4 mg Oral PRN Sindy Guadeloupe, NP  LORazepam (ATIVAN) tablet 1 mg  1 mg Oral Q6H PRN Dorthea Gauze, NP       LORazepam (ATIVAN) tablet 1 mg  1 mg Oral TID Dorthea Gauze, NP       Followed by   Cecily Cohen ON 10/08/2023] LORazepam (ATIVAN) tablet 1 mg  1 mg Oral BID Dorthea Gauze, NP       Followed by   Cecily Cohen ON 10/09/2023] LORazepam (ATIVAN) tablet 1 mg  1 mg Oral Daily Dorthea Gauze, NP       magnesium hydroxide (MILK OF MAGNESIA) suspension 30 mL  30 mL Oral Daily PRN Dorthea Gauze, NP       melatonin tablet 5 mg  5 mg Oral QHS PRN Kimber Fritts,  Marion Seese B, MD       metFORMIN (GLUCOPHAGE) tablet 500 mg  500 mg Oral BID WC Taneal Sonntag B, MD   500 mg at 10/06/23 1748   metoprolol succinate (TOPROL-XL) 24 hr tablet 50 mg  50 mg Oral Daily Maleni Seyer B, MD   50 mg at 10/06/23 1422   multivitamin with minerals tablet 1 tablet  1 tablet Oral Daily Dorthea Gauze, NP   1 tablet at 10/06/23 1610   nicotine (NICODERM CQ - dosed in mg/24 hours) patch 14 mg  14 mg Transdermal Daily Darris Carachure B, MD   14 mg at 10/06/23 1426   OLANZapine (ZYPREXA) injection 5 mg  5 mg Intramuscular TID PRN Dorthea Gauze, NP       OLANZapine zydis (ZYPREXA) disintegrating tablet 5 mg  5 mg Oral TID PRN Dorthea Gauze, NP       ondansetron (ZOFRAN-ODT) disintegrating tablet 4 mg  4 mg Oral Q6H PRN Dorthea Gauze, NP       thiamine (VITAMIN B1) injection 100 mg  100 mg Intramuscular Once Dorthea Gauze, NP       thiamine (VITAMIN B1) tablet 100 mg  100 mg Oral Daily Dorthea Gauze, NP       Vitamin D (Ergocalciferol) (DRISDOL) 1.25 MG (50000 UNIT) capsule 50,000 Units  50,000 Units Oral Weekly Haruto Demaria B, MD   50,000 Units at 10/06/23 1427   Current Outpatient Medications  Medication Sig Dispense Refill   albuterol (PROVENTIL HFA;VENTOLIN HFA) 108 (90 Base) MCG/ACT inhaler Inhale 2 puffs into the lungs every 6 (six) hours as needed for wheezing or shortness of breath. 1 Inhaler 2   aspirin EC 81 MG tablet Take 81 mg by mouth daily.     atorvastatin (LIPITOR) 40 MG tablet Take 40 mg by mouth daily.     escitalopram (LEXAPRO) 10 MG tablet Take 1 tablet (10 mg total) by mouth daily. 30 tablet 0   Magnesium Oxide 400 MG CAPS Take 1 capsule (400 mg total) by mouth every Monday, Wednesday, and Friday. Take a half tablet (200 mg) every Saturday, Sunday, Tuesday, Thursday. 60 capsule 2   metFORMIN (GLUCOPHAGE) 500 MG tablet Take 500 mg by mouth 2 (two) times daily.     metoprolol succinate (TOPROL-XL) 50 MG 24 hr tablet Take 1 tablet by mouth every night. Take with  or immediately following a meal. 90 tablet 3   nicotine (NICODERM CQ - DOSED IN MG/24 HOURS) 14 mg/24hr patch Place 1 patch (14 mg total) onto the skin daily. 28 patch 0   telmisartan (MICARDIS) 40 MG tablet Take 40 mg by mouth daily.  12   tiotropium (SPIRIVA HANDIHALER) 18 MCG inhalation capsule Place 1 capsule (18 mcg total) into inhaler and inhale daily. 30  capsule 2   Vitamin D, Ergocalciferol, (DRISDOL) 1.25 MG (50000 UNIT) CAPS capsule Take 50,000 Units by mouth once a week.     PTA Medications:  Facility Ordered Medications  Medication   [COMPLETED] LORazepam (ATIVAN) tablet 1 mg   acetaminophen (TYLENOL) tablet 650 mg   alum & mag hydroxide-simeth (MAALOX/MYLANTA) 200-200-20 MG/5ML suspension 30 mL   magnesium hydroxide (MILK OF MAGNESIA) suspension 30 mL   thiamine (VITAMIN B1) injection 100 mg   thiamine (VITAMIN B1) tablet 100 mg   multivitamin with minerals tablet 1 tablet   LORazepam (ATIVAN) tablet 1 mg   loperamide (IMODIUM) capsule 2-4 mg   ondansetron (ZOFRAN-ODT) disintegrating tablet 4 mg   OLANZapine zydis (ZYPREXA) disintegrating tablet 5 mg   OLANZapine (ZYPREXA) injection 5 mg   [COMPLETED] LORazepam (ATIVAN) tablet 1 mg   Followed by   LORazepam (ATIVAN) tablet 1 mg   Followed by   Melene Muller ON 10/08/2023] LORazepam (ATIVAN) tablet 1 mg   Followed by   Melene Muller ON 10/09/2023] LORazepam (ATIVAN) tablet 1 mg   albuterol (VENTOLIN HFA) 108 (90 Base) MCG/ACT inhaler 2 puff   irbesartan (AVAPRO) tablet 150 mg   aspirin EC tablet 81 mg   atorvastatin (LIPITOR) tablet 40 mg   metFORMIN (GLUCOPHAGE) tablet 500 mg   metoprolol succinate (TOPROL-XL) 24 hr tablet 50 mg   Vitamin D (Ergocalciferol) (DRISDOL) 1.25 MG (50000 UNIT) capsule 50,000 Units   escitalopram (LEXAPRO) tablet 10 mg   nicotine (NICODERM CQ - dosed in mg/24 hours) patch 14 mg   hydrOXYzine (ATARAX) tablet 25 mg   melatonin tablet 5 mg   PTA Medications  Medication Sig   aspirin EC 81 MG tablet Take  81 mg by mouth daily.   atorvastatin (LIPITOR) 40 MG tablet Take 40 mg by mouth daily.   albuterol (PROVENTIL HFA;VENTOLIN HFA) 108 (90 Base) MCG/ACT inhaler Inhale 2 puffs into the lungs every 6 (six) hours as needed for wheezing or shortness of breath.   tiotropium (SPIRIVA HANDIHALER) 18 MCG inhalation capsule Place 1 capsule (18 mcg total) into inhaler and inhale daily.   telmisartan (MICARDIS) 40 MG tablet Take 40 mg by mouth daily.   metFORMIN (GLUCOPHAGE) 500 MG tablet Take 500 mg by mouth 2 (two) times daily.   metoprolol succinate (TOPROL-XL) 50 MG 24 hr tablet Take 1 tablet by mouth every night. Take with or immediately following a meal.   Magnesium Oxide 400 MG CAPS Take 1 capsule (400 mg total) by mouth every Monday, Wednesday, and Friday. Take a half tablet (200 mg) every Saturday, Sunday, Tuesday, Thursday.   Vitamin D, Ergocalciferol, (DRISDOL) 1.25 MG (50000 UNIT) CAPS capsule Take 50,000 Units by mouth once a week.   escitalopram (LEXAPRO) 10 MG tablet Take 1 tablet (10 mg total) by mouth daily.   nicotine (NICODERM CQ - DOSED IN MG/24 HOURS) 14 mg/24hr patch Place 1 patch (14 mg total) onto the skin daily.      10/06/2023    1:37 AM  Depression screen PHQ 2/9  Decreased Interest 1  Down, Depressed, Hopeless 3  PHQ - 2 Score 4  Altered sleeping 1  Tired, decreased energy 2  Change in appetite 1  Feeling bad or failure about yourself  1  Trouble concentrating 1  Moving slowly or fidgety/restless 0  Suicidal thoughts 0  PHQ-9 Score 10   Flowsheet Row ED from 10/06/2023 in [facility] ED from 10/05/2023 in [facility] Emergency Department at [facility]  C-SSRS RISK CATEGORY No Risk No Risk      Musculoskeletal  Strength & Muscle Tone: within normal limits Gait & Station: normal Patient leans: N/A  Strength & Muscle Tone: within normal limits Gait & Station: normal Patient leans: N/A  Psychiatric Specialty Exam   Presentation General Appearance:Appropriate for Environment, Fairly Groomed Eye Contact:Fair Speech:Clear and Coherent, Normal Rate Volume:Normal Handedness:Right  Mood and Affect  Mood:Euthymic Affect:Congruent, Appropriate  Thought Process  Thought Process:Coherent Descriptions of Associations:Intact  Thought Content Suicidal Thoughts:No Homicidal Thoughts:No Hallucinations:None Ideas of Reference:None Thought Content:Logical  Sensorium  Memory:Remote Good Judgment:Fair Insight:Fair  Executive Functions  Orientation:Full (Time, Place and Person) Language:Good Concentration:Good Attention:Good Recall:Good Fund of Knowledge:Good  Psychomotor Activity  Psychomotor Activity:Psychomotor Activity: Soil scientist  Assets:Communication Skills, Resilience, Social Support  Sleep  Quality:Good Duration:   hours   Physical Exam  Physical Exam Vitals reviewed.  Constitutional:      Appearance: Normal appearance.  HENT:     Head: Normocephalic and atraumatic.  Cardiovascular:     Rate and Rhythm: Normal rate.  Pulmonary:     Effort: Pulmonary effort is normal.  Neurological:     General: No focal deficit present.     Mental Status: She is alert and oriented to person, place, and time.    Blood pressure (!) 142/87, pulse 81, temperature 97.9 F (36.6 C), temperature source Oral, resp. rate 16, SpO2 94%. There is no height or weight on file to calculate BMI.  Demographic Factors:  Caucasian  Loss Factors: Decline in physical health  Historical Factors: NA  Risk Reduction Factors:   Sense of responsibility to family, Living with another person, especially a relative, Positive social support, and Positive coping skills or problem solving skills  Continued Clinical Symptoms:  Depression:   Recent sense of peace/wellbeing Alcohol/Substance Abuse/Dependencies  Cognitive Features That Contribute To Risk:  Closed-mindedness    Suicide Risk:   Mild:  Suicidal ideation of limited frequency, intensity, duration, and specificity.  There are no identifiable plans, no associated intent, mild dysphoria and related symptoms, good self-control (both objective and subjective assessment), few other risk factors, and identifiable protective factors, including available and accessible social support.   Plan Of Care/Follow-up recommendations:  Activity as tolerated. Diet as recommended by PCP. Keep all scheduled follow-up appointments as recommended.  Patient is instructed to take all prescribed medications as recommended. Report any side effects or adverse reactions to your outpatient psychiatrist. Patient is instructed to abstain from alcohol and illegal drugs while on prescription medications. In the event of worsening symptoms, patient is instructed to call the crisis hotline, 911, or go to the nearest emergency department for evaluation and treatment.  Prescriptions given at discharge. Patient agreeable to plan. Given opportunity to ask questions. Appears to feel comfortable with discharge.  Patient is also instructed prior to discharge to: Take all medications as prescribed by mental healthcare provider. Report any adverse effects and or reactions from the medicines to outpatient provider promptly. Patient has been instructed & cautioned: To not engage in alcohol and or illegal drug use while on prescription medicines. In the event of worsening symptoms,  patient is instructed to call the crisis hotline, 911 and or go to the nearest ED for appropriate evaluation and treatment of symptoms. To follow-up with primary care provider for other medical issues, concerns and or health care needs  The patient was evaluated each day by a clinical provider to ascertain response to treatment. Improvement was noted by the patient's report  of decreasing symptoms, improved sleep and appetite, affect, medication tolerance, behavior, and participation in unit  programming.  Patient was asked each day to complete a self inventory noting mood, mental status, pain, new symptoms, anxiety and concerns.  Patient responded well to medication and being in a therapeutic and supportive environment. Positive and appropriate behavior was noted and the patient was motivated for recovery. The patient worked closely with the treatment team and case manager to develop a discharge plan with appropriate goals. Coping skills, problem solving as well as relaxation therapies were also part of the unit programming.  By the day of discharge patient was in much improved condition than upon admission.  Symptoms were reported as significantly decreased or resolved completely. The patient was motivated to continue taking medication with a goal of continued improvement in mental health.    Disposition:  Home with CDIOP on 4/22  Joice Nares, MD Psych Resident, PGY-2

## 2023-10-07 NOTE — Discharge Instructions (Addendum)
 Please go to your CDIOP group session new patient intake appointment on 4/22 at 1:30 PM at: [address], [place], [place] [address]. This will be on the second floor of the building.  Follow-up recommendations:  Activity:  Normal, as tolerated Diet:  Per PCP recommendation  Patient is instructed prior to discharge to: Take all medications as prescribed by her mental healthcare provider. Report any adverse effects and/or reactions from the medicines to her outpatient provider promptly. Patient has been instructed & cautioned: To not engage in alcohol and or illegal drug use while on prescription medicines.  In the event of worsening symptoms, patient is instructed to call the crisis hotline at 988, 911 and or go to the nearest ED for appropriate evaluation and treatment of symptoms. To follow-up with her primary care provider for your other medical issues, concerns and or health care needs.

## 2023-10-07 NOTE — ED Notes (Signed)
 Patient in the bedroom composed and and sleeping. NAD. Will continue to monitor for safety

## 2023-10-07 NOTE — ED Notes (Signed)

## 2023-10-07 NOTE — ED Notes (Signed)
 Pt in the bedroom calm and sleeping. NAD. Will continue to monitor for safety.

## 2023-10-12 ENCOUNTER — Ambulatory Visit (INDEPENDENT_AMBULATORY_CARE_PROVIDER_SITE_OTHER): Admitting: Licensed Clinical Social Worker

## 2023-10-12 DIAGNOSIS — F39 Unspecified mood [affective] disorder: Secondary | ICD-10-CM

## 2023-10-12 DIAGNOSIS — F1020 Alcohol dependence, uncomplicated: Secondary | ICD-10-CM | POA: Diagnosis not present

## 2023-10-12 DIAGNOSIS — F17200 Nicotine dependence, unspecified, uncomplicated: Secondary | ICD-10-CM
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
## 2023-10-29 ENCOUNTER — Telehealth: Payer: Self-pay | Admitting: Cardiology

## 2023-10-29 ENCOUNTER — Ambulatory Visit: Attending: Cardiology | Admitting: Cardiology

## 2023-10-29 ENCOUNTER — Encounter: Payer: Self-pay | Admitting: Cardiology

## 2023-10-29 VITALS — BP 150/94 | HR 76 | Ht 64.0 in | Wt 145.2 lb

## 2023-10-29 DIAGNOSIS — R079 Chest pain, unspecified: Secondary | ICD-10-CM | POA: Diagnosis not present

## 2023-10-29 DIAGNOSIS — E7800 Pure hypercholesterolemia, unspecified: Secondary | ICD-10-CM

## 2023-10-29 DIAGNOSIS — R002 Palpitations: Secondary | ICD-10-CM

## 2023-10-29 DIAGNOSIS — I10 Essential (primary) hypertension: Secondary | ICD-10-CM

## 2023-10-29 DIAGNOSIS — Z01812 Encounter for preprocedural laboratory examination: Secondary | ICD-10-CM

## 2023-10-29 MED ORDER — MAGNESIUM OXIDE -MG SUPPLEMENT 400 MG PO CAPS: ORAL_CAPSULE | ORAL | 3 refills | Status: AC

## 2023-10-29 MED ORDER — METOPROLOL TARTRATE 100 MG PO TABS
100.0000 mg | ORAL_TABLET | Freq: Once | ORAL | 0 refills | Status: AC
Start: 2023-10-29 — End: 2023-10-29

## 2023-10-29 MED ORDER — METOPROLOL SUCCINATE ER 50 MG PO TB24: 75.0000 mg | ORAL_TABLET | Freq: Every day | ORAL | 3 refills | Status: AC

## 2023-10-29 NOTE — Progress Notes (Unsigned)
 Cardiology Note    Date:  10/29/2023   ID:  Molly [name], DOB [birth year], MRN [id number]  PCP:  Molly Brady, MD  Cardiologist:  Molly Keas, MD   Chief Complaint  Patient presents with   Hypertension   Hyperlipidemia    History of Present Illness:  Molly [name] is a 68 y.o. female with a hx of COPD, HTN, diabetes mellitus, hyperlipidemia tobacco use and anxiety.  She was seen remotely in the past by Dr. Loetta [name] for chest pain and nuclear stress test showed no ischemia in 2019. She has chronic DOE with her COPD if she overexerts herself.    SHe is here today for followup and is doing well.  She has been having pressure in her chest a few times monthly lasting 5 minutes at a time.  Intermittently her left arm will hurt with the pain.  She denies any nausea or diaphoresis with the discomfort.  She has chronic DOE that is stable.  She denies any PND, orthopnea, dizziness or syncope. Occasionally she will have some LE edema. She is compliant with her meds and is tolerating meds with no SE.    Past Medical History:  Diagnosis Date   Anxiety    COPD (chronic obstructive pulmonary disease) (HCC)    Diabetes mellitus without complication (HCC)    Heart murmur    dx at age 58   Hypertension     Past Surgical History:  Procedure Laterality Date   NECK SURGERY      Current Medications: Current Meds  Medication Sig   aspirin  EC 81 MG tablet Take 81 mg by mouth daily.   atorvastatin  (LIPITOR) 40 MG tablet Take 40 mg by mouth daily.   escitalopram  (LEXAPRO ) 10 MG tablet Take 1 tablet (10 mg total) by mouth daily.   Magnesium  Oxide 400 MG CAPS Take 1 capsule (400 mg total) by mouth every Monday, Wednesday, and Friday. Take a half tablet (200 mg) every Saturday, Sunday, Tuesday, Thursday.   metFORMIN  (GLUCOPHAGE ) 500 MG tablet Take 500 mg by mouth 2 (two) times daily.   metoprolol  succinate (TOPROL -XL) 50 MG 24 hr tablet Take 1 tablet by mouth every night. Take with or immediately  following a meal.   nicotine  (NICODERM CQ  - DOSED IN MG/24 HOURS) 14 mg/24hr patch Place 1 patch (14 mg total) onto the skin daily.   telmisartan (MICARDIS) 40 MG tablet Take 40 mg by mouth daily.   tiotropium (SPIRIVA  HANDIHALER) 18 MCG inhalation capsule Place 1 capsule (18 mcg total) into inhaler and inhale daily.   Vitamin D , Ergocalciferol , (DRISDOL ) 1.25 MG (50000 UNIT) CAPS capsule Take 50,000 Units by mouth once a week.    Allergies:   Penicillins   Social History   Socioeconomic History   Marital status: Divorced    Spouse name: Not on file   Number of children: 1   Years of education: Not on file   Highest education level: Not on file  Occupational History   Occupation: Disabled due to back problems.  Tobacco Use   Smoking status: Every Day    Current packs/day: 0.50    Average packs/day: 0.5 packs/day for 50.0 years (25.0 ttl pk-yrs)    Types: Cigarettes   Smokeless tobacco: Never  Substance and Sexual Activity   Alcohol use: Not Currently   Drug use: No   Sexual activity: Not on file  Other Topics Concern   Not on file  Social History Narrative   Son has  passed away 2017      Right Handed   Lives in a one story mobile home    Social Drivers of Health   Financial Resource Strain: Not on file  Food Insecurity: No Food Insecurity (10/06/2023)   Hunger Vital Sign    Worried About Running Out of Food in the Last Year: Never true    Ran Out of Food in the Last Year: Never true  Transportation Needs: No Transportation Needs (10/06/2023)   PRAPARE - Administrator, Civil Service (Medical): No    Lack of Transportation (Non-Medical): No  Physical Activity: Not on file  Stress: Not on file  Social Connections: Unknown (11/04/2021)   [facility]   Social Network    Social Network: Not on file     Family History:  The patient's family history includes Diabetes in her mother; Heart failure in her father and mother;  Hypertension in her mother.   ROS:   Please see the history of present illness.    ROS All other systems reviewed and are negative.      No data to display             PHYSICAL EXAM:   VS:  BP (!) 150/94   Pulse 76   Ht 5\' 4"  (1.626 m)   Wt 145 lb 3.2 oz (65.9 kg)   SpO2 96%   BMI 24.92 kg/m    GEN: Well nourished, well developed in no acute distress HEENT: Normal NECK: No JVD; No carotid bruits LYMPHATICS: No lymphadenopathy CARDIAC:RRR, no murmurs, rubs, gallops RESPIRATORY:  Clear to auscultation without rales, wheezing or rhonchi  ABDOMEN: Soft, non-tender, non-distended MUSCULOSKELETAL:  No edema; No deformity  SKIN: Warm and dry NEUROLOGIC:  Alert and oriented x 3 PSYCHIATRIC:  Normal affect  Wt Readings from Last 3 Encounters:  10/29/23 145 lb 3.2 oz (65.9 kg)  10/05/23 140 lb (63.5 kg)  03/22/23 150 lb 3.2 oz (68.1 kg)      Studies/Labs Reviewed:   EKG Interpretation Date/Time:  Friday Oct 29 2023 07:53:50 EDT Ventricular Rate:  76 PR Interval:  142 QRS Duration:  76 QT Interval:  386 QTC Calculation: 434 R Axis:   16  Text Interpretation: Normal sinus rhythm Low voltage QRS Septal infarct , age undetermined When compared with ECG of 24-Jun-2010 10:55, Septal infarct is now Present T wave inversion now evident in Anterior leads Confirmed by Molly [name] [phone]) on 10/29/2023 8:01:39 AM    Recent Labs: 10/05/2023: ALT 38; BUN 13; Creatinine, Ser 0.53; Hemoglobin 14.2; Platelets 153; Potassium 5.0; Sodium 139   Lipid Panel No results found for: "CHOL", "TRIG", "HDL", "CHOLHDL", "VLDL", "LDLCALC", "LDLDIRECT"      Additional studies/ records that were reviewed today include:  EKG    ASSESSMENT:    1. Primary hypertension   2. Pure hypercholesterolemia   3. Palpitations      PLAN:  In order of problems listed above:  HTN -BP elevated on exam today -continue prescription drug management with Telmisartan 40mg  daily with PRN  refills -increase Toprol  XL to 75mg  daily -I have personally reviewed and interpreted outside labs performed by patient's PCP which showed SCr 0.53 and K+ 5 on 10/05/2023  2.  HLD -LDL goal< 100 -continue prescription drug management with atorvastatin  40mg  daily -I have personally reviewed and interpreted outside labs performed by patient's PCP which showed LDL 67 and HDL 67 on 02/19/2023  3.  Palpitations -Ziopatch 07/2022 showed  average HR 101bpm with PACs -occasionally she will have some palpitations -increase Toprol  XL to 75mg  daily  4.  Chest pain -this occurs a few times weekly for several minutes at a time -I will get a coronary CTA to define coronary anatomy and rule out CAD  Followup with me in 1 year   Time Spent: 20 minutes total time of encounter, including 15 minutes spent in face-to-face patient care on the date of this encounter. This time includes coordination of care and counseling regarding above mentioned problem list. Remainder of non-face-to-face time involved reviewing chart documents/testing relevant to the patient encounter and documentation in the medical record. I have independently reviewed documentation from referring provider  Medication Adjustments/Labs and Tests Ordered: Current medicines are reviewed at length with the patient today.  Concerns regarding medicines are outlined above.  Medication changes, Labs and Tests ordered today are listed in the Patient Instructions below.  Patient Instructions  Medication Instructions:  *** *If you need a refill on your cardiac medications before your next appointment, please call your pharmacy*  Lab Work: *** If you have labs (blood work) drawn today and your tests are completely normal, you will receive your results only by: MyChart Message (if you have MyChart) OR A paper copy in the mail If you have any lab test that is abnormal or we need to change your treatment, we will call you to review the  results.  Testing/Procedures: ***  Follow-Up: At [facility], you and your health needs are our priority.  As part of our continuing mission to provide you with exceptional heart care, our providers are all part of one team.  This team includes your primary Cardiologist (physician) and Advanced Practice Providers or APPs (Physician Assistants and Nurse Practitioners) who all work together to provide you with the care you need, when you need it.  Your next appointment:   {numbers 1-12:10294} {Time; day/wk/mo/yr(s):9076}  Provider:   {Providers/Teams        :[id number]} {If no MD populates, click here to update Cardiologist or EP   DO NOT delete brackets or number around this link :1}    Other Instructions ***       Signed, Molly Keas, MD  10/29/2023 8:19 AM    [facility] [address] [place], [place], [place]  [address] Phone: [phone]; Fax: [phone]

## 2023-10-29 NOTE — Telephone Encounter (Signed)
 Pt c/o medication issue:  1. Name of Medication:   metoprolol  succinate (TOPROL -XL) 50 MG 24 hr tablet   2. How are you currently taking this medication (dosage and times per day)?   3. Are you having a reaction (difficulty breathing--STAT)?   4. What is your medication issue?   Patient called to report to Dr. Micael Adas as requested that her previous dosage for this medication was 25 mg.

## 2023-10-29 NOTE — Patient Instructions (Addendum)
 Medication Instructions:  Your physician has recommended you make the following change in your medication:  1.) increase Toprol  XL (metoprolol  succinate) to 75 mg daily (ONE AND HALF TABLETS)  *If you need a refill on your cardiac medications before your next appointment, please call your pharmacy*  Lab Work: In 2 weeks: BMET If you have labs (blood work) drawn today and your tests are completely normal, you will receive your results only by: MyChart Message (if you have MyChart) OR A paper copy in the mail If you have any lab test that is abnormal or we need to change your treatment, we will call you to review the results.  Testing/Procedures: Your physician has requested that you have cardiac CT. Cardiac computed tomography (CT) is a painless test that uses an x-ray machine to take clear, detailed pictures of your heart. For further information please visit [email or web address] Please follow instruction sheet as given.  Follow-Up: At [facility], you and your health needs are our priority.  As part of our continuing mission to provide you with exceptional heart care, our providers are all part of one team.  This team includes your primary Cardiologist (physician) and Advanced Practice Providers or APPs (Physician Assistants and Nurse Practitioners) who all work together to provide you with the care you need, when you need it.  Your next appointment:   12 month(s)  Provider:   Gaylyn Keas, MD     Other Instructions   Your cardiac CT will be scheduled at one of the below locations:   [facility] [address] [place], [place] [address] [phone]  OR   Jeralene Mom. [facility] and Vascular Tower [address]  [place], [place] [address] Opening October 18, 2023  If scheduled at [facility], please arrive at the [facility] Entrance (Entrance C2) of [facility] 30 minutes prior to test start time. You can use the FREE valet  parking offered at entrance C (encouraged to control the heart rate for the test)  Proceed to the [facility] Radiology Department (first floor) to check-in and test prep.   All radiology patients and guests should use entrance C2 at [facility], accessed from [facility], even though the hospital's physical address listed is [address]    If scheduled at the Heart and Vascular Tower at [address], please enter the parking lot using the [address] entrance and use the FREE valet service at the patient drop-off area. Enter the buidling and check-in with registration on the main floor.  Please follow these instructions carefully (unless otherwise directed):  An IV will be required for this test and Nitroglycerin will be given.  Hold all erectile dysfunction medications at least 3 days (72 hrs) prior to test. (Ie viagra, cialis, sildenafil, tadalafil, etc)   On the Night Before the Test: Be sure to Drink plenty of water. Do not consume any caffeinated/decaffeinated beverages or chocolate 12 hours prior to your test. Do not take any antihistamines 12 hours prior to your test.  On the Day of the Test: Drink plenty of water until 1 hour prior to the test. Do not eat any food 1 hour prior to test. You may take your regular medications prior to the test.  On the morning of your test, do not take metoprolol  succinate (Toprol  XL), and take metoprolol  (Lopressor ) 100 mg two hours prior to test. Patients who wear a continuous glucose monitor MUST remove the device prior  to scanning. FEMALES- please wear underwire-free bra if available, avoid dresses & tight clothing  After the Test: Drink plenty of water. After receiving IV contrast, you may experience a mild flushed feeling. This is normal. On occasion, you may experience a mild rash up to 24 hours after the test. This is not dangerous. If this occurs, you can take Benadryl 25 mg, Zyrtec, Claritin, or  Allegra and increase your fluid intake. (Patients taking Tikosyn should avoid Benadryl, and may take Zyrtec, Claritin, or Allegra) If you experience trouble breathing, this can be serious. If it is severe call 911 IMMEDIATELY. If it is mild, please call our office.  We will call to schedule your test 2-4 weeks out understanding that some insurance companies will need an authorization prior to the service being performed.   For more information and frequently asked questions, please visit our website : [email or web address]  For non-scheduling related questions, please contact the cardiac imaging nurse navigator should you have any questions/concerns: Cardiac Imaging Nurse Navigators Direct Office Dial: [phone]   For scheduling needs, including cancellations and rescheduling, please call [place], [phone].

## 2023-10-29 NOTE — Telephone Encounter (Signed)
 Spoke to patient she stated already taken care of.

## 2023-11-01 ENCOUNTER — Telehealth (HOSPITAL_COMMUNITY): Payer: Self-pay | Admitting: *Deleted

## 2023-11-01 NOTE — Telephone Encounter (Signed)
 Reaching out to patient to offer assistance regarding upcoming cardiac imaging study; pt verbalizes understanding of appt date/time, parking situation and where to check in, pre-test NPO status and medications ordered, and verified current allergies; name and call back number provided for further questions should they arise Johney Frame RN Navigator Cardiac Imaging [facility] [phone] office [phone] cell

## 2023-11-02 ENCOUNTER — Ambulatory Visit (HOSPITAL_COMMUNITY)
Admission: RE | Admit: 2023-11-02 | Discharge: 2023-11-02 | Disposition: A | Discharge status: Home / Self Care | Source: Ambulatory Visit | Attending: Cardiology | Admitting: Cardiology

## 2023-11-02 DIAGNOSIS — I2510 Atherosclerotic heart disease of native coronary artery without angina pectoris: Secondary | ICD-10-CM | POA: Insufficient documentation

## 2023-11-02 DIAGNOSIS — R079 Chest pain, unspecified: Secondary | ICD-10-CM | POA: Insufficient documentation

## 2023-11-02 MED ORDER — IOHEXOL 350 MG/ML SOLN
50.0000 mL | Freq: Once | INTRAVENOUS | Status: AC | PRN
  Administered 2023-11-02: 50 mL via INTRAVENOUS

## 2023-11-02 MED ORDER — NITROGLYCERIN 0.4 MG SL SUBL
0.8000 mg | SUBLINGUAL_TABLET | Freq: Once | SUBLINGUAL | Status: AC
  Administered 2023-11-02: 0.8 mg via SUBLINGUAL

## 2023-11-03 ENCOUNTER — Encounter: Payer: Self-pay | Admitting: Cardiology

## 2023-11-03 ENCOUNTER — Ambulatory Visit: Payer: Self-pay | Admitting: Cardiology

## 2023-11-03 DIAGNOSIS — I2510 Atherosclerotic heart disease of native coronary artery without angina pectoris: Secondary | ICD-10-CM | POA: Insufficient documentation

## 2023-11-03 DIAGNOSIS — Z79899 Other long term (current) drug therapy: Secondary | ICD-10-CM

## 2023-11-04 NOTE — Telephone Encounter (Signed)
-----   Message from Gaylyn Keas sent at 11/03/2023  9:14 PM EDT ----- Coronary CTA showed  cor Cal score 351 with <25% ostial LM/prox-mid LAD/prox LCx/OM1 and 25-49% distal LCx. Continue ASA and statin.  Needs to come in for FLP and ALT.  Chest pain not cardiac.

## 2023-11-04 NOTE — Telephone Encounter (Signed)
 Call to patient to discuss Coronary CTA results. No answer, left detailed message per DPR explaining that CTA showed  cor Cal score 351 with <25% ostial LM/prox-mid LAD/prox LCx/OM1 and 25-49% distal LCx. Explained that Dr. Micael Adas would like patient to continue ASA and statin. Advised patient also needs to come in for FLP and ALT, explained that I would send orders to [facility] so he can walk in or make an appointment to complete these labs anytime in the next 1-2 weeks. Also advised that per Dr. Micael Adas, patient chest pain is likely not from a cardiac cause.  Asked that patient call our office if any questions. Lab Orders placed and released. [facility] message also sent.

## 2023-11-08 NOTE — Telephone Encounter (Signed)
 Call to patient to discuss cardiac and non-cardiac portions of the coronary CTA results. Patient verbalizes understanding of coronary CTA which showed cor Cal score 351 with <25% ostial LM/prox-mid LAD/prox LCx/OM1 and 25-49% distal LCx. Patient agrees to continue ASA and statin, states she will comine for fasting labs this week. Also advises that emphysema was seen on non-cardiac portion and that ct results will be forwarded to PCP. Patient states PCP is who manages her COPD so this would be good for her. Patient verbalizes understanding that chest pain not cardiac.

## 2023-11-08 NOTE — Telephone Encounter (Signed)
-----   Message from Gaylyn Keas sent at 11/03/2023  9:14 PM EDT ----- Coronary CTA showed  cor Cal score 351 with <25% ostial LM/prox-mid LAD/prox LCx/OM1 and 25-49% distal LCx. Continue ASA and statin.  Needs to come in for FLP and ALT.  Chest pain not cardiac.

## 2023-11-12 ENCOUNTER — Other Ambulatory Visit: Payer: Self-pay | Admitting: Cardiology

## 2023-11-12 DIAGNOSIS — R002 Palpitations: Secondary | ICD-10-CM

## 2023-11-12 LAB — BASIC METABOLIC PANEL WITH GFR
BUN/Creatinine Ratio: 30 — ABNORMAL HIGH (ref 12–28)
BUN: 16 mg/dL (ref 8–27)
CO2: 22 mmol/L (ref 20–29)
Calcium: 9.2 mg/dL (ref 8.7–10.3)
Chloride: 101 mmol/L (ref 96–106)
Creatinine, Ser: 0.54 mg/dL — ABNORMAL LOW (ref 0.57–1.00)
Glucose: 96 mg/dL (ref 70–99)
Potassium: 4.8 mmol/L (ref 3.5–5.2)
Sodium: 141 mmol/L (ref 134–144)
eGFR: 99 mL/min/{1.73_m2} (ref >59)

## 2023-11-12 LAB — ALT: ALT: 22 IU/L (ref 0–32)

## 2023-11-18 NOTE — Telephone Encounter (Signed)
-----   Message from Gaylyn Keas sent at 11/15/2023  2:20 PM EDT ----- Normal BMET

## 2023-11-18 NOTE — Telephone Encounter (Signed)
 Left detailed message with results of BMET on VM per DPR.

## 2023-11-18 NOTE — Telephone Encounter (Signed)
-----   Message from Nurse Aneta Bar sent at 11/16/2023  8:13 AM EDT -----  ----- Message ----- From: Jacqueline Matsu, MD Sent: 11/15/2023   2:19 PM EDT To: [facility] Triage  ALT normal but lipids still pending

## 2023-11-18 NOTE — Telephone Encounter (Signed)
 Call to patient to advise that ALT is normal but no record that lipids were done. Asked patient to call back so we can set up lipid labs.

## 2023-11-19 NOTE — Telephone Encounter (Signed)
 Patient is returning phone call.

## 2023-11-22 NOTE — Addendum Note (Signed)
 Addended by: Zorita Hiss on: 11/22/2023 01:20 PM   Modules accepted: Orders

## 2023-11-22 NOTE — Telephone Encounter (Signed)
 Called and spoke to pt. Discussed lab results to pt and advised pt to return to [facility] office to get labs drawn. Order has been placed. Pt stated she will get fasting labs tomorrow - understood lab results and was thankful for call.

## 2023-11-22 NOTE — Telephone Encounter (Signed)
 Patient is following up. She would like to discuss having additional labs.

## 2023-11-24 LAB — LIPID PANEL
Chol/HDL Ratio: 2.1 ratio (ref 0.0–4.4)
Cholesterol, Total: 119 mg/dL (ref 100–199)
HDL: 57 mg/dL (ref >39)
LDL Chol Calc (NIH): 46 mg/dL (ref 0–99)
Triglycerides: 79 mg/dL (ref 0–149)
VLDL Cholesterol Cal: 16 mg/dL (ref 5–40)

## 2023-12-01 ENCOUNTER — Telehealth: Payer: Self-pay | Admitting: Cardiology

## 2023-12-01 NOTE — Telephone Encounter (Signed)
 Left voicemail to return call to office

## 2023-12-01 NOTE — Telephone Encounter (Signed)
 Patient called to follow-up on lab results.

## 2023-12-01 NOTE — Telephone Encounter (Signed)
 Pt is returning call.

## 2023-12-01 NOTE — Telephone Encounter (Signed)
  Hi Molly [name], Dr. Micael Adas reviewed your cholesterol labs and feels you are doing great. Continue your current medications. I will forward these results to your PCP. Please let me know if you have any questions, Molly Beau, RN    Gave the information above to the patient. She verbalized understanding.

## 2023-12-03 NOTE — Telephone Encounter (Signed)
 Call to patient to advise that lipids are at goal. No answer, left detailed message per DPR explaining that lipids at goal and to continue current meds. Advised we would forward results to PCP and to call our office if any questions.

## 2024-02-15 ENCOUNTER — Other Ambulatory Visit: Payer: Self-pay | Admitting: Cardiology

## 2024-03-13 DIAGNOSIS — R748 Abnormal levels of other serum enzymes: Secondary | ICD-10-CM | POA: Diagnosis not present

## 2024-03-29 ENCOUNTER — Other Ambulatory Visit: Payer: Self-pay | Admitting: Cardiology

## 2024-04-04 DIAGNOSIS — H43823 Vitreomacular adhesion, bilateral: Secondary | ICD-10-CM | POA: Diagnosis not present

## 2024-04-04 DIAGNOSIS — H353133 Nonexudative age-related macular degeneration, bilateral, advanced atrophic without subfoveal involvement: Secondary | ICD-10-CM | POA: Diagnosis not present

## 2024-04-05 NOTE — Therapy (Signed)
 OUTPATIENT PHYSICAL THERAPY LOWER EXTREMITY EVALUATION   Patient Name: Molly [name] MRN: [id number] DOB:11-Jan-1956, 68 y.o., female Today's Date: 04/07/2024  END OF SESSION:  PT End of Session - 04/06/24 0920     Visit Number 1    Number of Visits 16    Date for Recertification  06/08/24    Authorization Type [employer]    Authorization - Visit Number 1    PT Start Time 0848    PT Stop Time 0930    PT Time Calculation (min) 42 min    Activity Tolerance Patient tolerated treatment well    Behavior During Therapy WFL for tasks assessed/performed          Past Medical History:  Diagnosis Date   Anxiety    CAD (coronary artery disease), native coronary artery    Coronary CTA showed  cor Cal score 351 with <25% ostial LM/prox-mid LAD/prox LCx/OM1 and 25-49% distal LCx.   COPD (chronic obstructive pulmonary disease) (HCC)    Diabetes mellitus without complication (HCC)    Heart murmur    dx at age 70   Hypertension    Past Surgical History:  Procedure Laterality Date   NECK SURGERY     Patient Active Problem List   Diagnosis Date Noted   CAD (coronary artery disease), native coronary artery    Alcohol abuse 10/06/2023   Gastroesophageal reflux disease    COPD mixed type (HCC)    Hypertension 05/14/2017   Depression with anxiety 05/14/2017   Multifocal pneumonia 05/14/2017   Current smoker 05/14/2017    PCP: Leonel Cole, MD   REFERRING PROVIDER: Leonel Cole, MD   REFERRING DIAG: R26.89 (ICD-10-CM) - Other abnormalities of gait and mobility   THERAPY DIAG:    Rationale for Evaluation and Treatment: Rehabilitation  ONSET DATE: Chronic weakness and falls  SUBJECTIVE:   SUBJECTIVE STATEMENT:  Chronic hx of weakness and falls. Pt estimates approx 4 falls per month without significant injury. Has R thumb pain, but does not affect use of SPC.  PERTINENT HISTORY: CAD, COPD, tobacco use, ETOH abuse  PAIN:  Are you having pain?: NPRS  scale: 0/10  PRECAUTIONS: None  RED FLAGS: None   WEIGHT BEARING RESTRICTIONS: No  FALLS:  Has patient fallen in last 6 months? Yes. Number of falls =20  LIVING ENVIRONMENT: Lives with: lives with their family Lives in: House/apartment Stairs: Yes: External: 5 steps; can reach both Has following equipment at home: Single point cane and Walker - 2 wheeled  OCCUPATION: Retired- disability  PLOF: Independent with household mobility without device  PATIENT GOALS: For legs to be stronger, to fall less   OBJECTIVE:  Note: Objective measures were completed at Evaluation unless otherwise noted.   PATIENT SURVEYS:  PSFS: THE PATIENT SPECIFIC FUNCTIONAL SCALE  Place score of 0-10 (0 = unable to perform activity and 10 = able to perform activity at the same level as before injury or problem)  Activity Date: 04/06/24    Walking 9/10    2. 9/10    3. 8/10    4.      Total Score 8.7      Total Score = Sum of activity scores/number of activities  Minimally Detectable Change: 3 points (for single activity); 2 points (for average score)  COGNITION: Overall cognitive status: Within functional limits for tasks assessed     SENSATION: WFL  EDEMA:  NT  MUSCLE LENGTH: Hamstrings: Right NT deg; Left NT deg Debby test: Right NT  deg; Left NT deg  POSTURE: No Significant postural limitations  PALPATION: NT  LOWER EXTREMITY ROM: WFLs Active ROM Right eval Left eval  Hip flexion    Hip extension    Hip abduction    Hip adduction    Hip internal rotation    Hip external rotation    Knee flexion    Knee extension    Ankle dorsiflexion    Ankle plantarflexion    Ankle inversion    Ankle eversion     (Blank rows = not tested)  LOWER EXTREMITY MMT:  MMT Right eval Left eval  Hip flexion 3 3  Hip extension 3 3  Hip abduction 3 3  Hip adduction    Hip internal rotation    Hip external rotation 3 3  Knee flexion 4 4  Knee extension 4 4  Ankle dorsiflexion  4 4  Ankle plantarflexion 4+ 4+  Ankle inversion 4+ 4+  Ankle eversion 4+ 4+   (Blank rows = not tested)  LOWER EXTREMITY SPECIAL TESTS:  NT  FUNCTIONAL TESTS:  5 times sit to stand: 21.9 c hands on thighs 2 minute walk test: TBA Single leg standing: 1 sec Narrow base standing: 1 sec Normal stance EC: 1 sec  GAIT: Distance walked: 62' x 2 Assistive device utilized: Single point cane and Walker - 2 wheeled Level of assistance: CGA Comments: CGA                                                                                                                TREATMENT DATE:  [facility] Adult PT Treatment:                                                DATE: 04/06/24 Therapeutic Exercise: Developed, instructed in, and pt completed therex as noted in HEP  Self Care: Fall safety- RW outside of home, SPC in home, proper lighting when getting up at night, not to negotiate steps unassisted, and to establish balance and resolve light headedness prior to walking after standing   PATIENT EDUCATION:  Education details: Eval findings, POC, HEP, self care  Person educated: Patient Education method: Explanation, Demonstration, Tactile cues, Verbal cues, and Handouts Education comprehension: verbalized understanding, returned demonstration, verbal cues required, and tactile cues required  HOME EXERCISE PROGRAM: Access Code: [email or web address] URL: [email or web address] Date: 04/06/2024 Prepared by: Dasie Daft  Exercises - Supine Bridge  - 1 x daily - 7 x weekly - 2 sets - 10 reps - 5 hold - Hooklying Clamshell with Resistance  - 1 x daily - 7 x weekly - 2 sets - 10 reps - 5 hold - Supine Hip Adduction Isometric with Ball  - 10 x daily - 7 x weekly - 2 sets - 10 reps - 5 hold - Active Straight Leg Raise with Quad Set  - 1 x daily - 7 x weekly - 2  sets - 10 reps - 3 hold - Sit to Stand with Counter Support  - 1 x daily - 7 x weekly - 2 sets - 5 reps  ASSESSMENT:  CLINICAL  IMPRESSION: Patient is a 68 y.o. female who was seen today for physical therapy evaluation and treatment for R26.89 (ICD-10-CM) - Other abnormalities of gait and mobility. Pt presents to PT with signs and symptoms consistent c referring Dx. Pt demonstrates markedly decreased hip and core strength, and decreased safety awareness re: falls. Pt may also be experiencing decreased function of her vestibular system. Pt est approx 4 falls a month. None with a significant injury. R thumb pain appears has signs and symptoms of DeQuervain's tenosynovitis. A HEP and safety ed for falls was started. Pt will benefit from skilled PT to address impairments to optimize functional mobility and safety    OBJECTIVE IMPAIRMENTS: decreased activity tolerance, decreased balance, difficulty walking, decreased strength, and decreased safety awareness.   ACTIVITY LIMITATIONS: carrying, lifting, bending, standing, squatting, stairs, transfers, bathing, toileting, dressing, and locomotion level  PARTICIPATION LIMITATIONS: meal prep, cleaning, laundry, shopping, and community activity  PERSONAL FACTORS: Fitness, Past/current experiences, Time since onset of injury/illness/exacerbation, and 1-2 comorbidities: CAD, COPD are also affecting patient's functional outcome.   REHAB POTENTIAL: Fair due degree of weakness  CLINICAL DECISION MAKING: Unstable/unpredictable  EVALUATION COMPLEXITY: High   GOALS:  SHORT TERM GOALS: Target date: 04/28/24 Pt will be Ind in an initial HEP  Baseline:started Goal status: INITIAL  2.  Pt will be call to recall fall safety tips Baseline: started Goal status: INITIAL  3.  Assess and Berg balance Baseline:  Goal status: INITIAL  LONG TERM GOALS: Target date: 06/08/24  Pt will be Ind in a final HEP to maintain achieved LOF  Baseline: started Goal status: INITIAL  2.  Increase hip strength to 4-/5 and knee to 4+/5 to improve balance and decrease fall risk Baseline:  Goal  status: INITIAL  3.  Improve 5xSTS by MCID of 5 and by MCID of 37ft as indication of improved functional mobility  Baseline: 5xSTS 21.9 c hands on thighs, TBA Goal status: INITIAL  4.  Increase Berg by 5 points as indication of improved balance Baseline: TBA Goal status: INITIAL  5.  Increase single leg stance, narrow base stance and standing c EC to 3 or greater as indication of improved balance Baseline: 1' sec for all measures Goal status: INITIAL  6.  Pt's PSFS score will improve by MCID of 2 points to 6.7/10 as indication of improved function  Baseline: 8.7/10 Goal status: INITIAL   PLAN:  PT FREQUENCY: 2x/week  PT DURATION: 8 weeks  PLANNED INTERVENTIONS: 97164- PT Re-evaluation, 97110-Therapeutic exercises, 97530- Therapeutic activity, 97112- Neuromuscular re-education, 97535- Self Care, 02859- Manual therapy, 97116- Gait training, [phone]- Ionotophoresis 4mg /ml Dexamethasone, Patient/Family education, Balance training, Stair training, Taping, Joint mobilization, Cryotherapy, and Moist heat  PLAN FOR NEXT SESSION: Assess and Berg; assess response to HEP; progress therex as indicated; use of modalities, manual therapy; and TPDN as indicated.    Patrece Tallie MS, PT 04/07/24 6:31 AM

## 2024-04-06 ENCOUNTER — Ambulatory Visit: Attending: Family Medicine

## 2024-04-06 ENCOUNTER — Other Ambulatory Visit: Payer: Self-pay

## 2024-04-06 DIAGNOSIS — R2689 Other abnormalities of gait and mobility: Secondary | ICD-10-CM | POA: Insufficient documentation

## 2024-04-06 DIAGNOSIS — M6281 Muscle weakness (generalized): Secondary | ICD-10-CM | POA: Insufficient documentation

## 2024-04-13 ENCOUNTER — Ambulatory Visit: Admitting: Physical Therapy

## 2024-04-18 NOTE — Therapy (Signed)
 OUTPATIENT PHYSICAL THERAPY LOWER EXTREMITY Treament   Patient Name: KHRISTINE VERNO MRN: [id number] DOB:[birth year], 68 y.o., female Today's Date: 04/19/2024  END OF SESSION:  PT End of Session - 04/19/24 0917     Visit Number 2    Number of Visits 16    Date for Recertification  06/08/24    Authorization Type [employer]    Authorization - Visit Number 2    PT Start Time [phone]    PT Stop Time 0930    PT Time Calculation (min) 40 min    Activity Tolerance Patient tolerated treatment well    Behavior During Therapy WFL for tasks assessed/performed           Past Medical History:  Diagnosis Date   Anxiety    CAD (coronary artery disease), native coronary artery    Coronary CTA showed  cor Cal score 351 with <25% ostial LM/prox-mid LAD/prox LCx/OM1 and 25-49% distal LCx.   COPD (chronic obstructive pulmonary disease) (HCC)    Diabetes mellitus without complication (HCC)    Heart murmur    dx at age 63   Hypertension    Past Surgical History:  Procedure Laterality Date   NECK SURGERY     Patient Active Problem List   Diagnosis Date Noted   CAD (coronary artery disease), native coronary artery    Alcohol abuse 10/06/2023   Gastroesophageal reflux disease    COPD mixed type (HCC)    Hypertension 05/14/2017   Depression with anxiety 05/14/2017   Multifocal pneumonia 05/14/2017   Current smoker 05/14/2017    PCP: Leonel Cole, MD   REFERRING PROVIDER: Leonel Cole, MD   REFERRING DIAG: R26.89 (ICD-10-CM) - Other abnormalities of gait and mobility   THERAPY DIAG:    Rationale for Evaluation and Treatment: Rehabilitation  ONSET DATE: Chronic weakness and falls  SUBJECTIVE:   SUBJECTIVE STATEMENT: Pt denies any falls since the PT eval. Pt reports she is following the recommended fall precautions.   EVAL: Chronic hx of weakness and falls. Pt estimates approx 4 falls per month without significant injury. Has R thumb pain, but does not  affect use of SPC.  PERTINENT HISTORY: CAD, COPD, tobacco use, ETOH abuse  PAIN:  Are you having pain?: NPRS scale: 0/10  PRECAUTIONS: None  RED FLAGS: None   WEIGHT BEARING RESTRICTIONS: No  FALLS:  Has patient fallen in last 6 months? Yes. Number of falls =20  LIVING ENVIRONMENT: Lives with: lives with their family Lives in: House/apartment Stairs: Yes: External: 5 steps; can reach both Has following equipment at home: Single point cane and Walker - 2 wheeled  OCCUPATION: Retired- disability  PLOF: Independent with household mobility without device  PATIENT GOALS: For legs to be stronger, to fall less   OBJECTIVE:  Note: Objective measures were completed at Evaluation unless otherwise noted.   PATIENT SURVEYS:  PSFS: THE PATIENT SPECIFIC FUNCTIONAL SCALE  Place score of 0-10 (0 = unable to perform activity and 10 = able to perform activity at the same level as before injury or problem)  Activity Date: 04/06/24    Walking 9/10    2. 9/10    3. 8/10    4.      Total Score 8.7      Total Score = Sum of activity scores/number of activities  Minimally Detectable Change: 3 points (for single activity); 2 points (for average score)  COGNITION: Overall cognitive status: Within functional limits for tasks assessed  SENSATION: WFL  EDEMA:  NT  MUSCLE LENGTH: Hamstrings: Right NT deg; Left NT deg Debby test: Right NT deg; Left NT deg  POSTURE: No Significant postural limitations  PALPATION: NT  LOWER EXTREMITY ROM: WFLs Active ROM Right eval Left eval  Hip flexion    Hip extension    Hip abduction    Hip adduction    Hip internal rotation    Hip external rotation    Knee flexion    Knee extension    Ankle dorsiflexion    Ankle plantarflexion    Ankle inversion    Ankle eversion     (Blank rows = not tested)  LOWER EXTREMITY MMT:  MMT Right eval Left eval  Hip flexion 3 3  Hip extension 3 3  Hip abduction 3 3  Hip adduction     Hip internal rotation    Hip external rotation 3 3  Knee flexion 4 4  Knee extension 4 4  Ankle dorsiflexion 4 4  Ankle plantarflexion 4+ 4+  Ankle inversion 4+ 4+  Ankle eversion 4+ 4+   (Blank rows = not tested)  LOWER EXTREMITY SPECIAL TESTS:  NT  FUNCTIONAL TESTS:  5 times sit to stand: 21.9 c hands on thighs 2 minute walk test: TBA Single leg standing: 1 sec Narrow base standing: 1 sec Normal stance EC: 1 sec  GAIT: Distance walked: 29' x 2 Assistive device utilized: Single point cane and Walker - 2 wheeled Level of assistance: CGA Comments: CGA                                                                                                                TREATMENT DATE:  [facility] Adult PT Treatment:                                                DATE: 04/19/24 Therapeutic Exercise: Supine Bridge 2x10 5 Hooklying Clamshell 2x10 5 RTB Supine Hip Adduction Isometric with Ball x15 5 Active Straight Leg Raise with Quad Set 2x10 3 S/L hip abd Sit to Stand 2x5, hands on legs Updated HEP Self Care: Reviewed fall safety tips   [facility] Adult PT Treatment:                                                DATE: 04/06/24 Therapeutic Exercise: Developed, instructed in, and pt completed therex as noted in HEP  Self Care: Fall safety- RW outside of home, [facility] in home, proper lighting when getting up at night, not to negotiate steps unassisted, and to establish balance and resolve light headedness prior to walking after standing   PATIENT EDUCATION:  Education details: Eval findings, POC, HEP, self care  Person educated: Patient Education method: Explanation, Demonstration, Tactile cues,  Verbal cues, and Handouts Education comprehension: verbalized understanding, returned demonstration, verbal cues required, and tactile cues required  HOME EXERCISE PROGRAM: Access Code: [email or web address] URL: [email or web address] Date: 04/06/2024 Prepared by: Dasie Daft  Exercises - Supine Bridge  - 1 x daily - 7 x weekly - 2 sets - 10 reps - 5 hold - Hooklying Clamshell with Resistance  - 1 x daily - 7 x weekly - 2 sets - 10 reps - 5 hold - Supine Hip Adduction Isometric with Ball  - 10 x daily - 7 x weekly - 2 sets - 10 reps - 5 hold - Active Straight Leg Raise with Quad Set  - 1 x daily - 7 x weekly - 2 sets - 10 reps - 3 hold - Sit to Stand with Counter Support  - 1 x daily - 7 x weekly - 2 sets - 5 reps  ASSESSMENT:  CLINICAL IMPRESSION: Reviewed fall precautions and pt is following those recommendations. Encouraged pt to complete her HEP daily. PT was completed to assess the and for LE strengthening. Pt tolerated prescribed exs in PT today without adverse effects. HEP was updated. Pt will continue to benefit from skilled PT to address impairments for improved function.   EVAL: Patient is a 68 y.o. female who was seen today for physical therapy evaluation and treatment for R26.89 (ICD-10-CM) - Other abnormalities of gait and mobility. Pt presents to PT with signs and symptoms consistent c referring Dx. Pt demonstrates markedly decreased hip and core strength, and decreased safety awareness re: falls. Pt may also be experiencing decreased function of her vestibular system. Pt est approx 4 falls a month. None with a significant injury. R thumb pain appears has signs and symptoms of DeQuervain's tenosynovitis. A HEP and safety ed for falls was started. Pt will benefit from skilled PT to address impairments to optimize functional mobility and safety    OBJECTIVE IMPAIRMENTS: decreased activity tolerance, decreased balance, difficulty walking, decreased strength, and decreased safety awareness.   ACTIVITY LIMITATIONS: carrying, lifting, bending, standing, squatting, stairs, transfers, bathing, toileting, dressing, and locomotion level  PARTICIPATION LIMITATIONS: meal prep, cleaning, laundry, shopping, and community activity  PERSONAL FACTORS:  Fitness, Past/current experiences, Time since onset of injury/illness/exacerbation, and 1-2 comorbidities: CAD, COPD are also affecting patient's functional outcome.   REHAB POTENTIAL: Fair due degree of weakness  CLINICAL DECISION MAKING: Unstable/unpredictable  EVALUATION COMPLEXITY: High   GOALS:  SHORT TERM GOALS: Target date: 04/28/24 Pt will be Ind in an initial HEP  Baseline:started Goal status: INITIAL  2.  Pt will be call to recall fall safety tips Baseline: started Goal status: INITIAL  3.  Assess and Berg balance Baseline:  Goal status: INITIAL  LONG TERM GOALS: Target date: 06/08/24  Pt will be Ind in a final HEP to maintain achieved LOF  Baseline: started Goal status: INITIAL  2.  Increase hip strength to 4-/5 and knee to 4+/5 to improve balance and decrease fall risk Baseline:  Goal status: INITIAL  3.  Improve 5xSTS by MCID of 5 and by MCID of 12ft as indication of improved functional mobility  Baseline: 5xSTS 21.9 c hands on thighs, [id number] c RW Goal status: INITIAL  4.  Increase Berg by 5 points as indication of improved balance Baseline: TBA Goal status: INITIAL  5.  Increase single leg stance, narrow base stance and standing c EC to 3 or greater as indication of improved balance Baseline: 1' sec for all measures Goal status: INITIAL  6.  Pt's PSFS score will improve by MCID of 2 points to 6.7/10 as indication of improved function  Baseline: 8.7/10 Goal status: INITIAL   PLAN:  PT FREQUENCY: 2x/week  PT DURATION: 8 weeks  PLANNED INTERVENTIONS: 97164- PT Re-evaluation, 97110-Therapeutic exercises, 97530- Therapeutic activity, 97112- Neuromuscular re-education, 97535- Self Care, 02859- Manual therapy, 97116- Gait training, [phone]- Ionotophoresis 4mg /ml Dexamethasone, Patient/Family education, Balance training, Stair training, Taping, Joint mobilization, Cryotherapy, and Moist heat  PLAN FOR NEXT SESSION: Assess and Berg;  assess response to HEP; progress therex as indicated; use of modalities, manual therapy; and TPDN as indicated.    Magnus Crescenzo MS, PT 04/19/24 9:30 AM

## 2024-04-19 ENCOUNTER — Ambulatory Visit

## 2024-04-19 DIAGNOSIS — M6281 Muscle weakness (generalized): Secondary | ICD-10-CM

## 2024-04-19 DIAGNOSIS — R2689 Other abnormalities of gait and mobility: Secondary | ICD-10-CM | POA: Diagnosis not present

## 2024-04-20 DIAGNOSIS — I10 Essential (primary) hypertension: Secondary | ICD-10-CM | POA: Diagnosis not present

## 2024-04-20 DIAGNOSIS — R195 Other fecal abnormalities: Secondary | ICD-10-CM | POA: Diagnosis not present

## 2024-04-20 NOTE — Therapy (Signed)
 OUTPATIENT PHYSICAL THERAPY LOWER EXTREMITY Treament   Patient Name: Molly [name] MRN: [id number] DOB:1956/05/21, 68 y.o., female Today's Date: 04/21/2024  END OF SESSION:  PT End of Session - 04/21/24 0854     Visit Number 3    Number of Visits 16    Date for Recertification  06/08/24    Authorization Type [employer]    Authorization - Visit Number 3    Authorization - Number of Visits 27    PT Start Time 0848    PT Stop Time 0930    PT Time Calculation (min) 42 min    Activity Tolerance Patient tolerated treatment well    Behavior During Therapy WFL for tasks assessed/performed            Past Medical History:  Diagnosis Date   Anxiety    CAD (coronary artery disease), native coronary artery    Coronary CTA showed  cor Cal score 351 with <25% ostial LM/prox-mid LAD/prox LCx/OM1 and 25-49% distal LCx.   COPD (chronic obstructive pulmonary disease) (HCC)    Diabetes mellitus without complication (HCC)    Heart murmur    dx at age 15   Hypertension    Past Surgical History:  Procedure Laterality Date   NECK SURGERY     Patient Active Problem List   Diagnosis Date Noted   CAD (coronary artery disease), native coronary artery    Alcohol abuse 10/06/2023   Gastroesophageal reflux disease    COPD mixed type (HCC)    Hypertension 05/14/2017   Depression with anxiety 05/14/2017   Multifocal pneumonia 05/14/2017   Current smoker 05/14/2017    PCP: Leonel Cole, MD   REFERRING PROVIDER: Leonel Cole, MD   REFERRING DIAG: R26.89 (ICD-10-CM) - Other abnormalities of gait and mobility   THERAPY DIAG:    Rationale for Evaluation and Treatment: Rehabilitation  ONSET DATE: Chronic weakness and falls  SUBJECTIVE:   SUBJECTIVE STATEMENT: Pt reports she feels like she is getting stronger.   EVAL: Chronic hx of weakness and falls. Pt estimates approx 4 falls per month without significant injury. Has R thumb pain, but does not affect use of  SPC.  PERTINENT HISTORY: CAD, COPD, tobacco use, ETOH abuse  PAIN:  Are you having pain?: NPRS scale: 0/10  PRECAUTIONS: None  RED FLAGS: None   WEIGHT BEARING RESTRICTIONS: No  FALLS:  Has patient fallen in last 6 months? Yes. Number of falls =20  LIVING ENVIRONMENT: Lives with: lives with their family Lives in: House/apartment Stairs: Yes: External: 5 steps; can reach both Has following equipment at home: Single point cane and Walker - 2 wheeled  OCCUPATION: Retired- disability  PLOF: Independent with household mobility without device  PATIENT GOALS: For legs to be stronger, to fall less   OBJECTIVE:  Note: Objective measures were completed at Evaluation unless otherwise noted.   PATIENT SURVEYS:  PSFS: THE PATIENT SPECIFIC FUNCTIONAL SCALE  Place score of 0-10 (0 = unable to perform activity and 10 = able to perform activity at the same level as before injury or problem)  Activity Date: 04/06/24    Walking 9/10    2. 9/10    3. 8/10    4.      Total Score 8.7      Total Score = Sum of activity scores/number of activities  Minimally Detectable Change: 3 points (for single activity); 2 points (for average score)  COGNITION: Overall cognitive status: Within functional limits for tasks assessed  SENSATION: WFL  EDEMA:  NT  MUSCLE LENGTH: Hamstrings: Right NT deg; Left NT deg Debby test: Right NT deg; Left NT deg  POSTURE: No Significant postural limitations  PALPATION: NT  LOWER EXTREMITY ROM: WFLs Active ROM Right eval Left eval  Hip flexion    Hip extension    Hip abduction    Hip adduction    Hip internal rotation    Hip external rotation    Knee flexion    Knee extension    Ankle dorsiflexion    Ankle plantarflexion    Ankle inversion    Ankle eversion     (Blank rows = not tested)  LOWER EXTREMITY MMT:  MMT Right eval Left eval  Hip flexion 3 3  Hip extension 3 3  Hip abduction 3 3  Hip adduction    Hip internal  rotation    Hip external rotation 3 3  Knee flexion 4 4  Knee extension 4 4  Ankle dorsiflexion 4 4  Ankle plantarflexion 4+ 4+  Ankle inversion 4+ 4+  Ankle eversion 4+ 4+   (Blank rows = not tested)  LOWER EXTREMITY SPECIAL TESTS:  NT  FUNCTIONAL TESTS:  5 times sit to stand: 21.9 c hands on thighs 2 minute walk test: TBA Single leg standing: 1 sec Narrow base standing: 1 sec Normal stance EC: 1 sec  GAIT: Distance walked: 84' x 2 Assistive device utilized: Single point cane and Walker - 2 wheeled Level of assistance: CGA Comments: CGA                                                                                                                TREATMENT DATE:  [facility] Adult PT Treatment:                                                DATE: 04/21/24 Therapeutic Exercise: LAQ 2x10 3# Seated marching 2x10 3# Supine Bridge 2x10 5 Hooklying Clamshell 2x10 5 GTB Supine Hip Adduction Isometric with Ball x15 5 Active Straight Leg Raise with Quad Set 2x10 3 S/L hip abd x15 Therapeutic Activity: Sit to Stand 2x5, hands on legs Standing balance: NB, NB head turns, tandem stance  [facility] Adult PT Treatment:                                                DATE: 04/19/24 Therapeutic Exercise: Supine Bridge 2x10 5 Hooklying Clamshell 2x10 5 RTB Supine Hip Adduction Isometric with Ball x15 5 Active Straight Leg Raise with Quad Set 2x10 3 S/L hip abd Sit to Stand 2x5, hands on legs Updated HEP Self Care: Reviewed fall safety tips   PATIENT EDUCATION:  Education details: Eval findings, POC, HEP, self care  Person educated: Patient Education  method: Explanation, Demonstration, Tactile cues, Verbal cues, and Handouts Education comprehension: verbalized understanding, returned demonstration, verbal cues required, and tactile cues required  HOME EXERCISE PROGRAM: Access Code: [email or web address] URL: [email or web address] Date: 04/06/2024 Prepared by: Dasie Daft  Exercises - Supine Bridge  - 1 x daily - 7 x weekly - 2 sets - 10 reps - 5 hold - Hooklying Clamshell with Resistance  - 1 x daily - 7 x weekly - 2 sets - 10 reps - 5 hold - Supine Hip Adduction Isometric with Ball  - 10 x daily - 7 x weekly - 2 sets - 10 reps - 5 hold - Active Straight Leg Raise with Quad Set  - 1 x daily - 7 x weekly - 2 sets - 10 reps - 3 hold - Sit to Stand with Counter Support  - 1 x daily - 7 x weekly - 2 sets - 5 reps  ASSESSMENT:  CLINICAL IMPRESSION: Standing balance exs were added to the pt's exs program today with use of counter for safety. Pt tolerated PT today without adverse effects. Pt will continue to benefit from skilled PT to address impairments for improved function    EVAL: Patient is a 68 y.o. female who was seen today for physical therapy evaluation and treatment for R26.89 (ICD-10-CM) - Other abnormalities of gait and mobility. Pt presents to PT with signs and symptoms consistent c referring Dx. Pt demonstrates markedly decreased hip and core strength, and decreased safety awareness re: falls. Pt may also be experiencing decreased function of her vestibular system. Pt est approx 4 falls a month. None with a significant injury. R thumb pain appears has signs and symptoms of DeQuervain's tenosynovitis. A HEP and safety ed for falls was started. Pt will benefit from skilled PT to address impairments to optimize functional mobility and safety    OBJECTIVE IMPAIRMENTS: decreased activity tolerance, decreased balance, difficulty walking, decreased strength, and decreased safety awareness.   ACTIVITY LIMITATIONS: carrying, lifting, bending, standing, squatting, stairs, transfers, bathing, toileting, dressing, and locomotion level  PARTICIPATION LIMITATIONS: meal prep, cleaning, laundry, shopping, and community activity  PERSONAL FACTORS: Fitness, Past/current experiences, Time since onset of injury/illness/exacerbation, and 1-2 comorbidities: CAD, COPD  are also affecting patient's functional outcome.   REHAB POTENTIAL: Fair due degree of weakness  CLINICAL DECISION MAKING: Unstable/unpredictable  EVALUATION COMPLEXITY: High   GOALS:  SHORT TERM GOALS: Target date: 04/28/24 Pt will be Ind in an initial HEP  Baseline:started Goal status: INITIAL  2.  Pt will be call to recall fall safety tips Baseline: started Goal status: INITIAL  3.  Assess and Berg balance Baseline:  Goal status: INITIAL  LONG TERM GOALS: Target date: 06/08/24  Pt will be Ind in a final HEP to maintain achieved LOF  Baseline: started Goal status: INITIAL  2.  Increase hip strength to 4-/5 and knee to 4+/5 to improve balance and decrease fall risk Baseline:  Goal status: INITIAL  3.  Improve 5xSTS by MCID of 5 and by MCID of 67ft as indication of improved functional mobility  Baseline: 5xSTS 21.9 c hands on thighs, 2MWT-185 c RW Goal status: INITIAL  4.  Increase Berg by 5 points as indication of improved balance Baseline: TBA Goal status: INITIAL  5.  Increase single leg stance, narrow base stance and standing c EC to 3 or greater as indication of improved balance Baseline: 1' sec for all measures Goal status: INITIAL  6.  Pt's PSFS score will improve by MCID of  2 points to 6.7/10 as indication of improved function  Baseline: 8.7/10 Goal status: INITIAL   PLAN:  PT FREQUENCY: 2x/week  PT DURATION: 8 weeks  PLANNED INTERVENTIONS: 97164- PT Re-evaluation, 97110-Therapeutic exercises, 97530- Therapeutic activity, 97112- Neuromuscular re-education, 97535- Self Care, 02859- Manual therapy, [phone]- Gait training, [phone]- Ionotophoresis 4mg /ml Dexamethasone, Patient/Family education, Balance training, Stair training, Taping, Joint mobilization, Cryotherapy, and Moist heat  PLAN FOR NEXT SESSION: Assess and Berg; assess response to HEP; progress therex as indicated; use of modalities, manual therapy; and TPDN as indicated.     Ianmichael Amescua MS, PT 04/21/24 9:34 AM

## 2024-04-21 ENCOUNTER — Ambulatory Visit

## 2024-04-21 DIAGNOSIS — R2689 Other abnormalities of gait and mobility: Secondary | ICD-10-CM

## 2024-04-21 DIAGNOSIS — M6281 Muscle weakness (generalized): Secondary | ICD-10-CM

## 2024-04-24 NOTE — Therapy (Signed)
 OUTPATIENT PHYSICAL THERAPY LOWER EXTREMITY Treament   Patient Name: Molly [name] MRN: [id number] DOB:04/11/1956, 68 y.o., female Today's Date: 04/25/2024  END OF SESSION:  PT End of Session - 04/25/24 0859     Visit Number 4    Number of Visits 16    Date for Recertification  06/08/24    Authorization Type [employer]    Authorization - Visit Number 4    Authorization - Number of Visits 27    PT Start Time 0850    PT Stop Time 0930    PT Time Calculation (min) 40 min    Activity Tolerance Patient tolerated treatment well    Behavior During Therapy WFL for tasks assessed/performed             Past Medical History:  Diagnosis Date   Anxiety    CAD (coronary artery disease), native coronary artery    Coronary CTA showed  cor Cal score 351 with <25% ostial LM/prox-mid LAD/prox LCx/OM1 and 25-49% distal LCx.   COPD (chronic obstructive pulmonary disease) (HCC)    Diabetes mellitus without complication (HCC)    Heart murmur    dx at age 58   Hypertension    Past Surgical History:  Procedure Laterality Date   NECK SURGERY     Patient Active Problem List   Diagnosis Date Noted   CAD (coronary artery disease), native coronary artery    Alcohol abuse 10/06/2023   Gastroesophageal reflux disease    COPD mixed type (HCC)    Hypertension 05/14/2017   Depression with anxiety 05/14/2017   Multifocal pneumonia 05/14/2017   Current smoker 05/14/2017    PCP: Leonel Cole, MD   REFERRING PROVIDER: Leonel Cole, MD   REFERRING DIAG: R26.89 (ICD-10-CM) - Other abnormalities of gait and mobility   THERAPY DIAG:    Rationale for Evaluation and Treatment: Rehabilitation  ONSET DATE: Chronic weakness and falls  SUBJECTIVE:   SUBJECTIVE STATEMENT: Pt reports she feels like she is getting stronger.   EVAL: Chronic hx of weakness and falls. Pt estimates approx 4 falls per month without significant injury. Has R thumb pain, but does not affect use  of SPC.  PERTINENT HISTORY: CAD, COPD, tobacco use, ETOH abuse  PAIN:  Are you having pain?: NPRS scale: 0/10  PRECAUTIONS: None  RED FLAGS: None   WEIGHT BEARING RESTRICTIONS: No  FALLS:  Has patient fallen in last 6 months? Yes. Number of falls =20  LIVING ENVIRONMENT: Lives with: lives with their family Lives in: House/apartment Stairs: Yes: External: 5 steps; can reach both Has following equipment at home: Single point cane and Walker - 2 wheeled  OCCUPATION: Retired- disability  PLOF: Independent with household mobility without device  PATIENT GOALS: For legs to be stronger, to fall less   OBJECTIVE:  Note: Objective measures were completed at Evaluation unless otherwise noted.   PATIENT SURVEYS:  PSFS: THE PATIENT SPECIFIC FUNCTIONAL SCALE  Place score of 0-10 (0 = unable to perform activity and 10 = able to perform activity at the same level as before injury or problem)  Activity Date: 04/06/24    Walking 9/10    2. 9/10    3. 8/10    4.      Total Score 8.7      Total Score = Sum of activity scores/number of activities  Minimally Detectable Change: 3 points (for single activity); 2 points (for average score)  COGNITION: Overall cognitive status: Within functional limits for tasks assessed  SENSATION: WFL  EDEMA:  NT  MUSCLE LENGTH: Hamstrings: Right NT deg; Left NT deg Debby test: Right NT deg; Left NT deg  POSTURE: No Significant postural limitations  PALPATION: NT  LOWER EXTREMITY ROM: WFLs Active ROM Right eval Left eval  Hip flexion    Hip extension    Hip abduction    Hip adduction    Hip internal rotation    Hip external rotation    Knee flexion    Knee extension    Ankle dorsiflexion    Ankle plantarflexion    Ankle inversion    Ankle eversion     (Blank rows = not tested)  LOWER EXTREMITY MMT:  MMT Right eval Left eval  Hip flexion 3 3  Hip extension 3 3  Hip abduction 3 3  Hip adduction    Hip  internal rotation    Hip external rotation 3 3  Knee flexion 4 4  Knee extension 4 4  Ankle dorsiflexion 4 4  Ankle plantarflexion 4+ 4+  Ankle inversion 4+ 4+  Ankle eversion 4+ 4+   (Blank rows = not tested)  LOWER EXTREMITY SPECIAL TESTS:  NT  FUNCTIONAL TESTS:  5 times sit to stand: 21.9 c hands on thighs 2 minute walk test: TBA Single leg standing: 1 sec Narrow base standing: 1 sec Normal stance EC: 1 sec  GAIT: Distance walked: 2' x 2 Assistive device utilized: Single point cane and Walker - 2 wheeled Level of assistance: CGA Comments: CGA                                                                                                                TREATMENT DATE:  [facility] Adult PT Treatment:                                                DATE: 04/25/24 Therapeutic Exercise: LAQ 2x10 3# Seated marching 2x10 3# Supine Bridge x20 5 Hooklying Clamshell x20 5 GTB Supine Hip Adduction Isometric with Ball x20 5 Active Straight Leg Raise with Quad Set 2x10 3 Therapeutic Activity: Sit to Stand 2x5, hands on legs Standing balance: NB, NB head turns, tandem stance Standing hip abd 2x10  [facility] Adult PT Treatment:                                                DATE: 04/21/24 Therapeutic Exercise: LAQ 2x10 3# Seated marching 2x10 3# Supine Bridge 2x10 5 Hooklying Clamshell 2x10 5 GTB Supine Hip Adduction Isometric with Ball x15 5 Active Straight Leg Raise with Quad Set 2x10 3 S/L hip abd x15 Therapeutic Activity: Sit to Stand 2x10, s use of hands Standing balance: NB, NB head turns, tandem stance  PATIENT EDUCATION:  Education details: Eval findings,  POC, HEP, self care  Person educated: Patient Education method: Explanation, Demonstration, Tactile cues, Verbal cues, and Handouts Education comprehension: verbalized understanding, returned demonstration, verbal cues required, and tactile cues required  HOME EXERCISE PROGRAM: Access Code: [email or web address] URL:  [email or web address] Date: 04/06/2024 Prepared by: Dasie Daft  Exercises - Supine Bridge  - 1 x daily - 7 x weekly - 2 sets - 10 reps - 5 hold - Hooklying Clamshell with Resistance  - 1 x daily - 7 x weekly - 2 sets - 10 reps - 5 hold - Supine Hip Adduction Isometric with Ball  - 10 x daily - 7 x weekly - 2 sets - 10 reps - 5 hold - Active Straight Leg Raise with Quad Set  - 1 x daily - 7 x weekly - 2 sets - 10 reps - 3 hold - Sit to Stand with Counter Support  - 1 x daily - 7 x weekly - 2 sets - 5 reps  ASSESSMENT:  CLINICAL IMPRESSION: Continued LE strengthening and balance. Pt demonstrated improved leg control/TKE c SlR with both legs today indicating improved strength. Pt continues to need assist of RW for proper level of assistance. Pt tolerated prescribed exs in PT today without adverse effects. Assess Lars the next appt  EVAL: Patient is a 68 y.o. female who was seen today for physical therapy evaluation and treatment for R26.89 (ICD-10-CM) - Other abnormalities of gait and mobility. Pt presents to PT with signs and symptoms consistent c referring Dx. Pt demonstrates markedly decreased hip and core strength, and decreased safety awareness re: falls. Pt may also be experiencing decreased function of her vestibular system. Pt est approx 4 falls a month. None with a significant injury. R thumb pain appears has signs and symptoms of DeQuervain's tenosynovitis. A HEP and safety ed for falls was started. Pt will benefit from skilled PT to address impairments to optimize functional mobility and safety    OBJECTIVE IMPAIRMENTS: decreased activity tolerance, decreased balance, difficulty walking, decreased strength, and decreased safety awareness.   ACTIVITY LIMITATIONS: carrying, lifting, bending, standing, squatting, stairs, transfers, bathing, toileting, dressing, and locomotion level  PARTICIPATION LIMITATIONS: meal prep, cleaning, laundry, shopping, and community  activity  PERSONAL FACTORS: Fitness, Past/current experiences, Time since onset of injury/illness/exacerbation, and 1-2 comorbidities: CAD, COPD are also affecting patient's functional outcome.   REHAB POTENTIAL: Fair due degree of weakness  CLINICAL DECISION MAKING: Unstable/unpredictable  EVALUATION COMPLEXITY: High   GOALS:  SHORT TERM GOALS: Target date: 04/28/24 Pt will be Ind in an initial HEP  Baseline:started 04/25/24: proper technique and completing daily Goal status: MET  2.  Pt will be call to recall fall safety tips Baseline: started 04/25/24: Following, needs cues to remember Goal status: MET  3.  Assess and Berg balance Baseline:  Goal status: INITIAL  LONG TERM GOALS: Target date: 06/08/24  Pt will be Ind in a final HEP to maintain achieved LOF  Baseline: started Goal status: INITIAL  2.  Increase hip strength to 4-/5 and knee to 4+/5 to improve balance and decrease fall risk Baseline:  Goal status: INITIAL  3.  Improve 5xSTS by MCID of 5 and by MCID of 30ft as indication of improved functional mobility  Baseline: 5xSTS 21.9 c hands on thighs, 2MWT-185 c RW Goal status: INITIAL  4.  Increase Berg by 5 points as indication of improved balance Baseline: TBA Goal status: INITIAL  5.  Increase single leg stance, narrow base stance and standing c EC to 3  or greater as indication of improved balance Baseline: 1' sec for all measures Goal status: INITIAL  6.  Pt's PSFS score will improve by MCID of 2 points to 6.7/10 as indication of improved function  Baseline: 8.7/10 Goal status: INITIAL   PLAN:  PT FREQUENCY: 2x/week  PT DURATION: 8 weeks  PLANNED INTERVENTIONS: 97164- PT Re-evaluation, 97110-Therapeutic exercises, 97530- Therapeutic activity, 97112- Neuromuscular re-education, 97535- Self Care, 02859- Manual therapy, 97116- Gait training, [phone]- Ionotophoresis 4mg /ml Dexamethasone, Patient/Family education, Balance training, Stair  training, Taping, Joint mobilization, Cryotherapy, and Moist heat  PLAN FOR NEXT SESSION: Assess and Berg; assess response to HEP; progress therex as indicated; use of modalities, manual therapy; and TPDN as indicated.    Kelseigh Diver MS, PT 04/25/24 9:31 AM

## 2024-04-25 ENCOUNTER — Ambulatory Visit: Attending: Family Medicine

## 2024-04-25 DIAGNOSIS — M6281 Muscle weakness (generalized): Secondary | ICD-10-CM | POA: Diagnosis present

## 2024-04-25 DIAGNOSIS — R2689 Other abnormalities of gait and mobility: Secondary | ICD-10-CM | POA: Insufficient documentation

## 2024-04-25 NOTE — Therapy (Signed)
 OUTPATIENT PHYSICAL THERAPY LOWER EXTREMITY Treament   Patient Name: Molly [name] MRN: [id number] DOB:21-Dec-1955, 68 y.o., female Today's Date: 04/28/2024  END OF SESSION:  PT End of Session - 04/28/24 0850     Visit Number 5    Number of Visits 16    Date for Recertification  06/08/24    Authorization Type [employer]    Authorization - Visit Number 5    Authorization - Number of Visits 27    PT Start Time 0845    PT Stop Time 0927    PT Time Calculation (min) 42 min    Activity Tolerance Patient tolerated treatment well    Behavior During Therapy WFL for tasks assessed/performed              Past Medical History:  Diagnosis Date   Anxiety    CAD (coronary artery disease), native coronary artery    Coronary CTA showed  cor Cal score 351 with <25% ostial LM/prox-mid LAD/prox LCx/OM1 and 25-49% distal LCx.   COPD (chronic obstructive pulmonary disease) (HCC)    Diabetes mellitus without complication (HCC)    Heart murmur    dx at age 37   Hypertension    Past Surgical History:  Procedure Laterality Date   NECK SURGERY     Patient Active Problem List   Diagnosis Date Noted   CAD (coronary artery disease), native coronary artery    Alcohol abuse 10/06/2023   Gastroesophageal reflux disease    COPD mixed type (HCC)    Hypertension 05/14/2017   Depression with anxiety 05/14/2017   Multifocal pneumonia 05/14/2017   Current smoker 05/14/2017    PCP: Leonel Cole, MD   REFERRING PROVIDER: Leonel Cole, MD   REFERRING DIAG: R26.89 (ICD-10-CM) - Other abnormalities of gait and mobility   THERAPY DIAG:    Rationale for Evaluation and Treatment: Rehabilitation  ONSET DATE: Chronic weakness and falls  SUBJECTIVE:   SUBJECTIVE STATEMENT: Pt states she is feeling well today. Pt notes she is doing exs everyday. Pt reports she did fall yesterday when trying to put on her pants in standing which she normal never tries to do. She is not sure  why she tried to do this. Pt states she did not injure herself.  EVAL: Chronic hx of weakness and falls. Pt estimates approx 4 falls per month without significant injury. Has R thumb pain, but does not affect use of SPC.  PERTINENT HISTORY: CAD, COPD, tobacco use, ETOH abuse  PAIN:  Are you having pain?: NPRS scale: 0/10  PRECAUTIONS: None  RED FLAGS: None   WEIGHT BEARING RESTRICTIONS: No  FALLS:  Has patient fallen in last 6 months? Yes. Number of falls =20  LIVING ENVIRONMENT: Lives with: lives with their family Lives in: House/apartment Stairs: Yes: External: 5 steps; can reach both Has following equipment at home: Single point cane and Walker - 2 wheeled  OCCUPATION: Retired- disability  PLOF: Independent with household mobility without device  PATIENT GOALS: For legs to be stronger, to fall less   OBJECTIVE:  Note: Objective measures were completed at Evaluation unless otherwise noted.   PATIENT SURVEYS:  PSFS: THE PATIENT SPECIFIC FUNCTIONAL SCALE  Place score of 0-10 (0 = unable to perform activity and 10 = able to perform activity at the same level as before injury or problem)  Activity Date: 04/06/24    Walking 9/10    2. 9/10    3. 8/10    4.  Total Score 8.7      Total Score = Sum of activity scores/number of activities  Minimally Detectable Change: 3 points (for single activity); 2 points (for average score)  COGNITION: Overall cognitive status: Within functional limits for tasks assessed     SENSATION: WFL  EDEMA:  NT  MUSCLE LENGTH: Hamstrings: Right NT deg; Left NT deg Debby test: Right NT deg; Left NT deg  POSTURE: No Significant postural limitations  PALPATION: NT  LOWER EXTREMITY ROM: WFLs Active ROM Right eval Left eval  Hip flexion    Hip extension    Hip abduction    Hip adduction    Hip internal rotation    Hip external rotation    Knee flexion    Knee extension    Ankle dorsiflexion    Ankle  plantarflexion    Ankle inversion    Ankle eversion     (Blank rows = not tested)  LOWER EXTREMITY MMT:  MMT Right eval Left eval  Hip flexion 3 3  Hip extension 3 3  Hip abduction 3 3  Hip adduction    Hip internal rotation    Hip external rotation 3 3  Knee flexion 4 4  Knee extension 4 4  Ankle dorsiflexion 4 4  Ankle plantarflexion 4+ 4+  Ankle inversion 4+ 4+  Ankle eversion 4+ 4+   (Blank rows = not tested)  LOWER EXTREMITY SPECIAL TESTS:  NT  FUNCTIONAL TESTS:  5 times sit to stand: 21.9 c hands on thighs 2 minute walk test: TBA Single leg standing: 1 sec Narrow base standing: 1 sec Normal stance EC: 1 sec  GAIT: Distance walked: 75' x 2 Assistive device utilized: Single point cane and Walker - 2 wheeled Level of assistance: CGA Comments: CGA                                                                                                                TREATMENT DATE:  [facility] Adult PT Treatment:                                                DATE: 04/28/24 Therapeutic Activity: Nustep 6 min L4 UE/LE Sit to Stand 2x10, hands on legs Heel raises/toe lifts 2x10 Standing balance: NB, NB head turns, tandem stance Standing hip abd 2x10 BERG BALANCE Sitting to Standing: Numbers; 0-4: 3  4. Stands without using hands and stabilize independently  3. Stands independently using hands  2. Stands using hands after multiple trials  1. Min A to stand  0. Mod-Max A to stand Standing unsupported: Numbers; 0-4: 4  4. Stands safely for 2 minutes  3. Stands 2 minutes with supervision  2. Stands 30 seconds unsupported  1. Needs several tries to stand unsupported for 30 seconds  0. Unable to stand unsupported for 30 seconds Sitting unsupported: Numbers; 0-4: 4  4. Sits for 2 minutes  independently  3. Sits for 2 minutes with supervision  2. Able to sit 30 seconds  1. Able to sit 10 seconds  0. Unable to sit for 10 seconds Standing to Sitting: Numbers; 0-4: 3 4. Sits  safely with minimal use of hands 3. Controls descent with hands 2. Uses back of legs against chair to control descent 1. Sits independently, but uncontrolled descent 0. Needs assistance Transfers: Numbers; 0-4: 3  4. Transfers safely with minor use of hands  3. Transfers safely definite use of hands  2. Transfers with verbal cueing/supervision  1. Needs 1 person assist  0. Needs 2 person assist  Standing with eyes closed: Numbers; 0-4: 3  4. Stands safely for 10 seconds  3. Stands 10 seconds with supervision   2. Able to stand for 3 seconds  1. Unable to keep eyes closed for 3 seconds, but is safe  0. Needs assist to keep from falling Standing with feet together: Numbers; 0-4: 2 4. Stands for 1 minute safely 3. Stands for 1 minute with supervision 2. Unable to hold for 30 seconds  1. Needs help to attain position but can hold for 15 seconds  0. Needs help to attain position and unable to hold for 15 seconds Reaching forward with outstretched arm: Numbers; 0-4: 3  4. Reaches forward 10 inches  3. Reaches forward 5 inches  2. Reaches forward 2 inches  1. Reaches forward with supervision  0. Loses balance/requires assistace Retrieving object from the floor: Numbers; 0-4: 3 4. Able to pick up easily and safely 3. Able to pick up with supervision 2. Unable to pick up, but reaches within 1-2 inches independently 1. Unable to pick up and needs supervision 0. Unable/needs assistance to keep from falling  Turning to look behind: Numbers; 0-4: 3  4. Looks behind from both sides and weight shifts well  3. Looks behind one side only, other side less weight shift  2. Turns sideways only, maintains balance  1. Needs supervision when turning  0. Needs assistance  Turning 360 degrees: Numbers; 0-4: 1  4. Able to turn in </=4 seconds  3. Able to turn on one side in </= 4 seconds   2. Able to turn slowly, but safely  1. Needs supervision or verbal cueing  0. Needs assistance Place  alternate foot on stool: Numbers; 0-4: 1 4. Completes 8 steps in 20 seconds 3. Completes 8 steps in >20 seconds 2. 4 steps without assistance/supervision 1. Completes >2 steps with minimal assist 0. Unable, needs assist to keep from falling Standing with one foot in front: Numbers; 0-4: 0  4. Independent tandem for 30 seconds  3. Independent foot ahead for 30 seconds  2. Independent small step for 30 seconds  1. Needs help to step, but can hold for 15 seconds  0. Loses balance while standing/stepping Standing on one foot: Numbers; 0-4: 1 4. Holds >10 seconds 3. Holds 5-10 seconds 2. Holds >/=3 seconds  1. Holds <3 seconds 0. Unable  Total Score: 34/56   [facility] Adult PT Treatment:                                                DATE: 04/25/24 Therapeutic Exercise: LAQ 2x10 3# Seated marching 2x10 3# Supine Bridge x20 5 Hooklying Clamshell x20 5 GTB Supine Hip Adduction Isometric with Ball x20 5  Active Straight Leg Raise with Quad Set 2x10 3 Therapeutic Activity: Sit to Stand 2x5, hands on legs Standing balance: NB, NB head turns, tandem stance Standing hip abd 2x10  PATIENT EDUCATION:  Education details: Eval findings, POC, HEP, self care  Person educated: Patient Education method: Explanation, Demonstration, Tactile cues, Verbal cues, and Handouts Education comprehension: verbalized understanding, returned demonstration, verbal cues required, and tactile cues required  HOME EXERCISE PROGRAM: Access Code: [email or web address] URL: [email or web address] Date: 04/06/2024 Prepared by: Dasie Daft  Exercises - Supine Bridge  - 1 x daily - 7 x weekly - 2 sets - 10 reps - 5 hold - Hooklying Clamshell with Resistance  - 1 x daily - 7 x weekly - 2 sets - 10 reps - 5 hold - Supine Hip Adduction Isometric with Ball  - 10 x daily - 7 x weekly - 2 sets - 10 reps - 5 hold - Active Straight Leg Raise with Quad Set  - 1 x daily - 7 x weekly - 2 sets - 10 reps - 3 hold - Sit to  Stand with Counter Support  - 1 x daily - 7 x weekly - 2 sets - 5 reps  ASSESSMENT:  CLINICAL IMPRESSION: Completed Berg and as anticipated, pt has a low score which endorses the use of a RW for assistance with ambulation. With the , her distance remains at 1108ft. Pt walks with a narrow BOS and has decreased DF/heel strike. Pt tolerated PT today without adverse effects. Pt has decreased safety awareness c recent fall when she tried to put on pants in standing. Pt will continue to benefit from skilled PT to address impairments for improved functional mobility and safety.    EVAL: Patient is a 68 y.o. female who was seen today for physical therapy evaluation and treatment for R26.89 (ICD-10-CM) - Other abnormalities of gait and mobility. Pt presents to PT with signs and symptoms consistent c referring Dx. Pt demonstrates markedly decreased hip and core strength, and decreased safety awareness re: falls. Pt may also be experiencing decreased function of her vestibular system. Pt est approx 4 falls a month. None with a significant injury. R thumb pain appears has signs and symptoms of DeQuervain's tenosynovitis. A HEP and safety ed for falls was started. Pt will benefit from skilled PT to address impairments to optimize functional mobility and safety    OBJECTIVE IMPAIRMENTS: decreased activity tolerance, decreased balance, difficulty walking, decreased strength, and decreased safety awareness.   ACTIVITY LIMITATIONS: carrying, lifting, bending, standing, squatting, stairs, transfers, bathing, toileting, dressing, and locomotion level  PARTICIPATION LIMITATIONS: meal prep, cleaning, laundry, shopping, and community activity  PERSONAL FACTORS: Fitness, Past/current experiences, Time since onset of injury/illness/exacerbation, and 1-2 comorbidities: CAD, COPD are also affecting patient's functional outcome.   REHAB POTENTIAL: Fair due degree of weakness  CLINICAL DECISION MAKING:  Unstable/unpredictable  EVALUATION COMPLEXITY: High   GOALS:  SHORT TERM GOALS: Target date: 04/28/24 Pt will be Ind in an initial HEP  Baseline:started 04/25/24: proper technique and completing daily Goal status: MET  2.  Pt will be call to recall fall safety tips Baseline: started 04/25/24: Following, needs cues to remember Goal status: MET  3.  Assess and Berg balance Baseline:  04/28/24: 185 c RW; 34/56 Goal status: MET  LONG TERM GOALS: Target date: 06/08/24  Pt will be Ind in a final HEP to maintain achieved LOF  Baseline: started Goal status: INITIAL  2.  Increase hip strength to 4-/5 and knee to 4+/5 to  improve balance and decrease fall risk Baseline:  Goal status: INITIAL  3.  Improve 5xSTS by MCID of 5 and by MCID of 39ft as indication of improved functional mobility  Baseline: 5xSTS 21.9 c hands on thighs, 2MWT-185 c RW Goal status: INITIAL  4.  Increase Berg by 5 points as indication of improved balance Baseline: 34/56 Goal status: INITIAL  5.  Increase single leg stance, narrow base stance and standing c EC to 3 or greater as indication of improved balance Baseline: 1' sec for all measures Goal status: INITIAL  6.  Pt's PSFS score will improve by MCID of 2 points to 6.7/10 as indication of improved function  Baseline: 8.7/10 Goal status: INITIAL   PLAN:  PT FREQUENCY: 2x/week  PT DURATION: 8 weeks  PLANNED INTERVENTIONS: 97164- PT Re-evaluation, 97110-Therapeutic exercises, 97530- Therapeutic activity, 97112- Neuromuscular re-education, 97535- Self Care, 02859- Manual therapy, 97116- Gait training, [phone]- Ionotophoresis 4mg /ml Dexamethasone, Patient/Family education, Balance training, Stair training, Taping, Joint mobilization, Cryotherapy, and Moist heat  PLAN FOR NEXT SESSION: Assess and Berg; assess response to HEP; progress therex as indicated; use of modalities, manual therapy; and TPDN as indicated.    Rolondo Pierre MS,  PT 04/28/24 9:30 AM

## 2024-04-26 ENCOUNTER — Ambulatory Visit

## 2024-04-28 ENCOUNTER — Ambulatory Visit

## 2024-04-28 DIAGNOSIS — M6281 Muscle weakness (generalized): Secondary | ICD-10-CM

## 2024-04-28 DIAGNOSIS — R2689 Other abnormalities of gait and mobility: Secondary | ICD-10-CM | POA: Diagnosis not present

## 2024-05-02 NOTE — Therapy (Signed)
 OUTPATIENT PHYSICAL THERAPY LOWER EXTREMITY Treament   Patient Name: Molly [name] MRN: [id number] DOB:15-Feb-1956, 68 y.o., female Today's Date: 05/03/2024  END OF SESSION:  PT End of Session - 05/03/24 0901     Visit Number 6    Number of Visits 16    Date for Recertification  06/08/24    Authorization Type [employer]    Authorization - Visit Number 6    Authorization - Number of Visits 27    PT Start Time 0851    PT Stop Time 0933    PT Time Calculation (min) 42 min    Activity Tolerance Patient tolerated treatment well    Behavior During Therapy WFL for tasks assessed/performed               Past Medical History:  Diagnosis Date   Anxiety    CAD (coronary artery disease), native coronary artery    Coronary CTA showed  cor Cal score 351 with <25% ostial LM/prox-mid LAD/prox LCx/OM1 and 25-49% distal LCx.   COPD (chronic obstructive pulmonary disease) (HCC)    Diabetes mellitus without complication (HCC)    Heart murmur    dx at age 45   Hypertension    Past Surgical History:  Procedure Laterality Date   NECK SURGERY     Patient Active Problem List   Diagnosis Date Noted   CAD (coronary artery disease), native coronary artery    Alcohol abuse 10/06/2023   Gastroesophageal reflux disease    COPD mixed type (HCC)    Hypertension 05/14/2017   Depression with anxiety 05/14/2017   Multifocal pneumonia 05/14/2017   Current smoker 05/14/2017    PCP: Leonel Cole, MD   REFERRING PROVIDER: Leonel Cole, MD   REFERRING DIAG: R26.89 (ICD-10-CM) - Other abnormalities of gait and mobility   THERAPY DIAG:    Rationale for Evaluation and Treatment: Rehabilitation  ONSET DATE: Chronic weakness and falls  SUBJECTIVE:   SUBJECTIVE STATEMENT: Pt states she is feeling weak today. She denies any new falls.  EVAL: Chronic hx of weakness and falls. Pt estimates approx 4 falls per month without significant injury. Has R thumb pain, but does  not affect use of SPC.  PERTINENT HISTORY: CAD, COPD, tobacco use, ETOH abuse  PAIN:  Are you having pain?: NPRS scale: 0/10  PRECAUTIONS: None  RED FLAGS: None   WEIGHT BEARING RESTRICTIONS: No  FALLS:  Has patient fallen in last 6 months? Yes. Number of falls =20  LIVING ENVIRONMENT: Lives with: lives with their family Lives in: House/apartment Stairs: Yes: External: 5 steps; can reach both Has following equipment at home: Single point cane and Walker - 2 wheeled  OCCUPATION: Retired- disability  PLOF: Independent with household mobility without device  PATIENT GOALS: For legs to be stronger, to fall less   OBJECTIVE:  Note: Objective measures were completed at Evaluation unless otherwise noted.   PATIENT SURVEYS:  PSFS: THE PATIENT SPECIFIC FUNCTIONAL SCALE  Place score of 0-10 (10 = unable to perform activity and 0 = able to perform activity at the same level as before injury or problem)  Activity Date: 04/06/24  05/03/24   Walking 9/10 6   2. Standing 9/10 5   3. Sit to stand 8/10 6   4.      Total Score 8.7 5.7     Total Score = Sum of activity scores/number of activities  Minimally Detectable Change: 3 points (for single activity); 2 points (for average score)  COGNITION: Overall  cognitive status: Within functional limits for tasks assessed     SENSATION: WFL  EDEMA:  NT  MUSCLE LENGTH: Hamstrings: Right NT deg; Left NT deg Debby test: Right NT deg; Left NT deg  POSTURE: No Significant postural limitations  PALPATION: NT  LOWER EXTREMITY ROM: WFLs Active ROM Right eval Left eval  Hip flexion    Hip extension    Hip abduction    Hip adduction    Hip internal rotation    Hip external rotation    Knee flexion    Knee extension    Ankle dorsiflexion    Ankle plantarflexion    Ankle inversion    Ankle eversion     (Blank rows = not tested)  LOWER EXTREMITY MMT:  MMT Right eval Left eval  Hip flexion 3 3  Hip extension  3 3  Hip abduction 3 3  Hip adduction    Hip internal rotation    Hip external rotation 3 3  Knee flexion 4 4  Knee extension 4 4  Ankle dorsiflexion 4 4  Ankle plantarflexion 4+ 4+  Ankle inversion 4+ 4+  Ankle eversion 4+ 4+   (Blank rows = not tested)  LOWER EXTREMITY SPECIAL TESTS:  NT  FUNCTIONAL TESTS:  5 times sit to stand: 21.9 c hands on thighs 2 minute walk test: TBA Single leg standing: 1 sec Narrow base standing: 1 sec Normal stance EC: 1 sec  GAIT: Distance walked: 61' x 2 Assistive device utilized: Single point cane and Walker - 2 wheeled Level of assistance: CGA Comments: CGA                                                                                                                TREATMENT DATE:  [facility] Adult PT Treatment:                                                DATE: 05/03/24 Therapeutic Exercise: LAQ 2x10 3# Seated marching 2x10 3# Active Straight Leg Raise with Quad Set 2x10 3 Therapeutic Activity: Sit to Stand 2x10 s use of hands 5xSTS Standing balance: NB, NB head turns, tandem stance Standing hip abd 2x12 3# Lateral step ups 2x10 each Forward step ups 2x10 each Heel raises 3x10  [facility] Adult PT Treatment:                                                DATE: 04/28/24 Therapeutic Activity: Nustep 6 min L4 UE/LE Sit to Stand 2x10, hands on legs Heel raises/toe lifts 2x10 Standing balance: NB, NB head turns, tandem stance Standing hip abd 2x10 BERG BALANCE Total Score: 34/56   [facility] Adult PT Treatment:  DATE: 04/25/24 Therapeutic Exercise: LAQ 2x10 3# Seated marching 2x10 3# Supine Bridge x20 5 Hooklying Clamshell x20 5 GTB Supine Hip Adduction Isometric with Ball x20 5 Active Straight Leg Raise with Quad Set 2x10 3 Therapeutic Activity: Sit to Stand 2x5, hands on legs Standing balance: NB, NB head turns, tandem stance Standing hip abd 2x10  PATIENT EDUCATION:  Education  details: Eval findings, POC, HEP, self care  Person educated: Patient Education method: Explanation, Demonstration, Tactile cues, Verbal cues, and Handouts Education comprehension: verbalized understanding, returned demonstration, verbal cues required, and tactile cues required  HOME EXERCISE PROGRAM: Access Code: [email or web address] URL: [email or web address] Date: 04/06/2024 Prepared by: Dasie Daft  Exercises - Supine Bridge  - 1 x daily - 7 x weekly - 2 sets - 10 reps - 5 hold - Hooklying Clamshell with Resistance  - 1 x daily - 7 x weekly - 2 sets - 10 reps - 5 hold - Supine Hip Adduction Isometric with Ball  - 10 x daily - 7 x weekly - 2 sets - 10 reps - 5 hold - Active Straight Leg Raise with Quad Set  - 1 x daily - 7 x weekly - 2 sets - 10 reps - 3 hold - Sit to Stand with Counter Support  - 1 x daily - 7 x weekly - 2 sets - 5 reps  ASSESSMENT:  CLINICAL IMPRESSION: Completed PT for lower body strengthening. Pt's 5xSTS time has made significant improvement indicating improved strength. Pt is also self reporting functional activities at an improved level. Pt tolerated PT today without adverse effects.   EVAL: Patient is a 68 y.o. female who was seen today for physical therapy evaluation and treatment for R26.89 (ICD-10-CM) - Other abnormalities of gait and mobility. Pt presents to PT with signs and symptoms consistent c referring Dx. Pt demonstrates markedly decreased hip and core strength, and decreased safety awareness re: falls. Pt may also be experiencing decreased function of her vestibular system. Pt est approx 4 falls a month. None with a significant injury. R thumb pain appears has signs and symptoms of DeQuervain's tenosynovitis. A HEP and safety ed for falls was started. Pt will benefit from skilled PT to address impairments to optimize functional mobility and safety    OBJECTIVE IMPAIRMENTS: decreased activity tolerance, decreased balance, difficulty walking, decreased  strength, and decreased safety awareness.   ACTIVITY LIMITATIONS: carrying, lifting, bending, standing, squatting, stairs, transfers, bathing, toileting, dressing, and locomotion level  PARTICIPATION LIMITATIONS: meal prep, cleaning, laundry, shopping, and community activity  PERSONAL FACTORS: Fitness, Past/current experiences, Time since onset of injury/illness/exacerbation, and 1-2 comorbidities: CAD, COPD are also affecting patient's functional outcome.   REHAB POTENTIAL: Fair due degree of weakness  CLINICAL DECISION MAKING: Unstable/unpredictable  EVALUATION COMPLEXITY: High   GOALS:  SHORT TERM GOALS: Target date: 04/28/24 Pt will be Ind in an initial HEP  Baseline:started 04/25/24: proper technique and completing daily Goal status: MET  2.  Pt will be call to recall fall safety tips Baseline: started 04/25/24: Following, needs cues to remember Goal status: MET  3.  Assess and Berg balance Baseline:  04/28/24: 185 c RW; 34/56 Goal status: MET  LONG TERM GOALS: Target date: 06/08/24  Pt will be Ind in a final HEP to maintain achieved LOF  Baseline: started Goal status: INITIAL  2.  Increase hip strength to 4-/5 and knee to 4+/5 to improve balance and decrease fall risk Baseline:  Goal status: INITIAL  3.  Improve 5xSTS by MCID of 5  and by MCID of 59ft as indication of improved functional mobility  Baseline: 5xSTS 21.9 c hands on thighs, 2MWT-185 c RW 05/03/24: 5xSTS= 13.7 Goal status: MET for 5xSTS  4.  Increase Berg by 5 points as indication of improved balance Baseline: 34/56 Goal status: INITIAL  5.  Increase single leg stance, narrow base stance and standing c EC to 3 or greater as indication of improved balance Baseline: 1' sec for all measures Goal status: INITIAL  6.  Pt's PSFS score will improve by MCID of 2 points to 6.7/10 as indication of improved function  Baseline: 8.7/10 05/03/24: 5.7/10 Goal status: MET   PLAN:  PT  FREQUENCY: 2x/week  PT DURATION: 8 weeks  PLANNED INTERVENTIONS: 97164- PT Re-evaluation, 97110-Therapeutic exercises, 97530- Therapeutic activity, 97112- Neuromuscular re-education, 97535- Self Care, 02859- Manual therapy, 97116- Gait training, [phone]- Ionotophoresis 4mg /ml Dexamethasone, Patient/Family education, Balance training, Stair training, Taping, Joint mobilization, Cryotherapy, and Moist heat  PLAN FOR NEXT SESSION: Assess and Berg; assess response to HEP; progress therex as indicated; use of modalities, manual therapy; and TPDN as indicated.    Janeane Cozart MS, PT 05/03/24 9:36 AM

## 2024-05-03 ENCOUNTER — Ambulatory Visit

## 2024-05-03 DIAGNOSIS — R2689 Other abnormalities of gait and mobility: Secondary | ICD-10-CM | POA: Diagnosis not present

## 2024-05-03 DIAGNOSIS — M6281 Muscle weakness (generalized): Secondary | ICD-10-CM

## 2024-05-04 NOTE — Therapy (Signed)
 OUTPATIENT PHYSICAL THERAPY LOWER EXTREMITY Treatment/DC   Patient Name: Molly [name] MRN: [id number] DOB:17-Mar-1956, 68 y.o., female Today's Date: 05/05/2024  END OF SESSION:  PT End of Session - 05/05/24 0903     Visit Number 7    Number of Visits 16    Date for Recertification  06/08/24    Authorization Type [employer]    Authorization - Visit Number 7    Authorization - Number of Visits 27    PT Start Time 0847    PT Stop Time 0930    PT Time Calculation (min) 43 min    Activity Tolerance Patient tolerated treatment well    Behavior During Therapy WFL for tasks assessed/performed                Past Medical History:  Diagnosis Date   Anxiety    CAD (coronary artery disease), native coronary artery    Coronary CTA showed  cor Cal score 351 with <25% ostial LM/prox-mid LAD/prox LCx/OM1 and 25-49% distal LCx.   COPD (chronic obstructive pulmonary disease) (HCC)    Diabetes mellitus without complication (HCC)    Heart murmur    dx at age 69   Hypertension    Past Surgical History:  Procedure Laterality Date   NECK SURGERY     Patient Active Problem List   Diagnosis Date Noted   CAD (coronary artery disease), native coronary artery    Alcohol abuse 10/06/2023   Gastroesophageal reflux disease    COPD mixed type (HCC)    Hypertension 05/14/2017   Depression with anxiety 05/14/2017   Multifocal pneumonia 05/14/2017   Current smoker 05/14/2017    PCP: Leonel Cole, MD   REFERRING PROVIDER: Leonel Cole, MD   REFERRING DIAG: R26.89 (ICD-10-CM) - Other abnormalities of gait and mobility   THERAPY DIAG:    Rationale for Evaluation and Treatment: Rehabilitation  ONSET DATE: Chronic weakness and falls  SUBJECTIVE:   SUBJECTIVE STATEMENT: Pt requested to be Dced from PT services stating she has improved and wishes to continue with her rehab at home per her HEP.  EVAL: Chronic hx of weakness and falls. Pt estimates approx 4  falls per month without significant injury. Has R thumb pain, but does not affect use of SPC.  PERTINENT HISTORY: CAD, COPD, tobacco use, ETOH abuse  PAIN:  Are you having pain?: NPRS scale: 0/10  PRECAUTIONS: None  RED FLAGS: None   WEIGHT BEARING RESTRICTIONS: No  FALLS:  Has patient fallen in last 6 months? Yes. Number of falls =20  LIVING ENVIRONMENT: Lives with: lives with their family Lives in: House/apartment Stairs: Yes: External: 5 steps; can reach both Has following equipment at home: Single point cane and Walker - 2 wheeled  OCCUPATION: Retired- disability  PLOF: Independent with household mobility without device  PATIENT GOALS: For legs to be stronger, to fall less   OBJECTIVE:  Note: Objective measures were completed at Evaluation unless otherwise noted.   PATIENT SURVEYS:  PSFS: THE PATIENT SPECIFIC FUNCTIONAL SCALE  Place score of 0-10 (10 = unable to perform activity and 0 = able to perform activity at the same level as before injury or problem)  Activity Date: 04/06/24  05/03/24   Walking 9/10 6   2. Standing 9/10 5   3. Sit to stand 8/10 6   4.      Total Score 8.7 5.7     Total Score = Sum of activity scores/number of activities  Minimally Detectable Change:  3 points (for single activity); 2 points (for average score)  COGNITION: Overall cognitive status: Within functional limits for tasks assessed     SENSATION: WFL  EDEMA:  NT  MUSCLE LENGTH: Hamstrings: Right NT deg; Left NT deg Debby test: Right NT deg; Left NT deg  POSTURE: No Significant postural limitations  PALPATION: NT  LOWER EXTREMITY ROM: WFLs Active ROM Right eval Left eval  Hip flexion    Hip extension    Hip abduction    Hip adduction    Hip internal rotation    Hip external rotation    Knee flexion    Knee extension    Ankle dorsiflexion    Ankle plantarflexion    Ankle inversion    Ankle eversion     (Blank rows = not tested)  LOWER  EXTREMITY MMT: 05/05/24 MMT Right eval Left eval  Hip flexion 3+ 3+  Hip extension 3+ 3+  Hip abduction 3+ 3+  Hip adduction    Hip internal rotation    Hip external rotation 3+ 3+  Knee flexion 4+ 4+  Knee extension 4 4  Ankle dorsiflexion 4 4  Ankle plantarflexion 4+ 4+  Ankle inversion 4+ 4+  Ankle eversion 4+ 4+   (Blank rows = not tested)  LOWER EXTREMITY SPECIAL TESTS:  NT  FUNCTIONAL TESTS:  5 times sit to stand: 21.9 c hands on thighs 2 minute walk test: TBA Single leg standing: 1 sec Narrow base standing: 1 sec Normal stance EC: 1 sec  GAIT: Distance walked: 32' x 2 Assistive device utilized: Single point cane and Walker - 2 wheeled Level of assistance: CGA Comments: CGA                                                                                                                TREATMENT DATE:  [facility] Adult PT Treatment:                                                DATE: 05/05/24 Therapeutic Exercise: MMT LAQ 2x10 3# Bridging x15 Clam x15 GTB Hip add sets c ball Active Straight Leg Raise with Quad Set x15 3: Therapeutic Activity: Sit to Stand 2x10 s use of hands Gait training c RW, 240 and for distance 555' c a RW Standing balance: NB, NB head turns, tandem stance Standing hip abd 2x12 3# Heel raises 2x10 Final HEP  [facility] Adult PT Treatment:                                                DATE: 05/03/24 Therapeutic Exercise: LAQ 2x10 3# Seated marching 2x10 3# Active Straight Leg Raise with Quad Set 2x10 3 Therapeutic Activity: Sit to Stand 2x10 s use of hands 5xSTS Standing  balance: NB, NB head turns, tandem stance Standing hip abd 2x12 3# Lateral step ups 2x10 each Forward step ups 2x10 each Heel raises 3x10  PATIENT EDUCATION:  Education details: Eval findings, POC, HEP, self care  Person educated: Patient Education method: Explanation, Demonstration, Tactile cues, Verbal cues, and Handouts Education comprehension: verbalized  understanding, returned demonstration, verbal cues required, and tactile cues required  HOME EXERCISE PROGRAM: Access Code: [email or web address] URL: [email or web address] Date: 05/05/2024 Prepared by: Dasie Daft  Exercises - Supine Bridge  - 1 x daily - 7 x weekly - 2 sets - 10 reps - 5 hold - Hooklying Clamshell with Resistance  - 1 x daily - 7 x weekly - 2 sets - 10 reps - 5 hold - Supine Hip Adduction Isometric with Ball  - 10 x daily - 7 x weekly - 2 sets - 10 reps - 5 hold - Active Straight Leg Raise with Quad Set  - 1 x daily - 7 x weekly - 2 sets - 10 reps - 3 hold - Sit to Stand with Counter Support  - 1 x daily - 7 x weekly - 2 sets - 5 reps - Sidelying Hip Abduction  - 1 x daily - 7 x weekly - 2 sets - 10 reps - 3 hold - Standing Romberg to 3/4 Tandem Stance  - 1 x daily - 7 x weekly - 1 sets - 5 reps - 30 hold - Heel Toe Raises with Counter Support  - 1 x daily - 7 x weekly - 2 sets - 10-15 reps - 2 hold - Standing Hip Abduction with Counter Support  - 1 x daily - 7 x weekly - 2 sets - 10-15 reps - 3 hold  ASSESSMENT:  CLINICAL IMPRESSION: Pt requested to complete her OPPT program today approx 1 month prior to the end of her cert period. Pt is pleased with her progress and wishes to continue her rehab at home c her HEP. Pt has made appropriate progress for the time frame she has been in PT, but activity tolerance, balance, and strength deficits remain. Pt is Ind with a HEP to maintain/progress her achieved LOF.  EVAL: Patient is a 68 y.o. female who was seen today for physical therapy evaluation and treatment for R26.89 (ICD-10-CM) - Other abnormalities of gait and mobility. Pt presents to PT with signs and symptoms consistent c referring Dx. Pt demonstrates markedly decreased hip and core strength, and decreased safety awareness re: falls. Pt may also be experiencing decreased function of her vestibular system. Pt est approx 4 falls a month. None with a significant injury. R  thumb pain appears has signs and symptoms of DeQuervain's tenosynovitis. A HEP and safety ed for falls was started. Pt will benefit from skilled PT to address impairments to optimize functional mobility and safety    OBJECTIVE IMPAIRMENTS: decreased activity tolerance, decreased balance, difficulty walking, decreased strength, and decreased safety awareness.   ACTIVITY LIMITATIONS: carrying, lifting, bending, standing, squatting, stairs, transfers, bathing, toileting, dressing, and locomotion level  PARTICIPATION LIMITATIONS: meal prep, cleaning, laundry, shopping, and community activity  PERSONAL FACTORS: Fitness, Past/current experiences, Time since onset of injury/illness/exacerbation, and 1-2 comorbidities: CAD, COPD are also affecting patient's functional outcome.   REHAB POTENTIAL: Fair due degree of weakness  CLINICAL DECISION MAKING: Unstable/unpredictable  EVALUATION COMPLEXITY: High   GOALS:  SHORT TERM GOALS: Target date: 04/28/24 Pt will be Ind in an initial HEP  Baseline:started 04/25/24: proper technique and completing daily Goal status: MET  2.  Pt will be call to recall fall safety tips Baseline: started 04/25/24: Following, needs cues to remember Goal status: MET  3.  Assess and Berg balance Baseline:  04/28/24: 185 c RW; 34/56 Goal status: MET  LONG TERM GOALS: Target date: 06/08/24  Pt will be Ind in a final HEP to maintain achieved LOF  Baseline: started Goal status: MET  2.  Increase hip strength to 4-/5 and knee to 4+/5 to improve balance and decrease fall risk Baseline:  Goal status: Improved  3.  Improve 5xSTS by MCID of 5 and by MCID of 58ft as indication of improved functional mobility  Baseline: 5xSTS 21.9 c hands on thighs, 2MWT-185 c RW 05/03/24: 5xSTS= 13.7 05/05/24: 240' Goal status: MET for 5xSTS; MET for @MWT   4.  Increase Berg by 5 points as indication of improved balance Baseline: 34/56 Goal status: Deferred with  pt requestng to be Dc  5.  Increase single leg stance, narrow base stance and standing c EC to 3 or greater as indication of improved balance Baseline: 1' sec for all measures 05/05/24: 1 bilat Goal status: NOT MET  6.  Pt's PSFS score will improve by MCID of 2 points to 6.7/10 as indication of improved function  Baseline: 8.7/10 05/03/24: 5.7/10 Goal status: MET   PLAN:  PT FREQUENCY: 2x/week  PT DURATION: 8 weeks  PLANNED INTERVENTIONS: 97164- PT Re-evaluation, 97110-Therapeutic exercises, 97530- Therapeutic activity, 97112- Neuromuscular re-education, 97535- Self Care, [id number]- Manual therapy, [phone]- Gait training, [phone]- Ionotophoresis 4mg /ml Dexamethasone, Patient/Family education, Balance training, Stair training, Taping, Joint mobilization, Cryotherapy, and Moist heat  PLAN FOR NEXT SESSION: Assess and Berg; assess response to HEP; progress therex as indicated; use of modalities, manual therapy; and TPDN as indicated.    Takako Minckler MS, PT 05/05/24 2:45 PM  PHYSICAL THERAPY DISCHARGE SUMMARY  Visits from Start of Care: 7  Current functional level related to goals / functional outcomes: See clinical impression and PT goals    Remaining deficits: See clinical impression and PT goals    Education / Equipment: HEP/Pt Ed   Patient agrees to discharge. Patient goals were partially met. Patient is being discharged due to being pleased with the current functional level.  Blimi Godby MS, PT 05/05/24 2:45 PM

## 2024-05-05 ENCOUNTER — Ambulatory Visit

## 2024-05-05 DIAGNOSIS — M6281 Muscle weakness (generalized): Secondary | ICD-10-CM

## 2024-05-05 DIAGNOSIS — R2689 Other abnormalities of gait and mobility: Secondary | ICD-10-CM
